# Patient Record
Sex: Male | Born: 1965
Health system: Southern US, Community
[De-identification: ages and names within clinical notes are randomized; demographics above are authoritative.]

## PROBLEM LIST (undated history)

## (undated) DIAGNOSIS — F250 Schizoaffective disorder, bipolar type: Secondary | ICD-10-CM

## (undated) DIAGNOSIS — F259 Schizoaffective disorder, unspecified: Secondary | ICD-10-CM

## (undated) DIAGNOSIS — K219 Gastro-esophageal reflux disease without esophagitis: Secondary | ICD-10-CM

## (undated) DIAGNOSIS — J449 Chronic obstructive pulmonary disease, unspecified: Secondary | ICD-10-CM

## (undated) DIAGNOSIS — F319 Bipolar disorder, unspecified: Secondary | ICD-10-CM

---

## 2011-03-31 ENCOUNTER — Other Ambulatory Visit (HOSPITAL_BASED_OUTPATIENT_CLINIC_OR_DEPARTMENT_OTHER): Payer: Self-pay | Admitting: Internal Medicine

## 2011-03-31 ENCOUNTER — Ambulatory Visit (HOSPITAL_BASED_OUTPATIENT_CLINIC_OR_DEPARTMENT_OTHER)
Admission: RE | Admit: 2011-03-31 | Discharge: 2011-03-31 | Disposition: A | Discharge status: Home / Self Care | Payer: Medicare Other | Source: Ambulatory Visit | Attending: Internal Medicine | Admitting: Internal Medicine

## 2011-03-31 DIAGNOSIS — M7989 Other specified soft tissue disorders: Secondary | ICD-10-CM

## 2011-03-31 DIAGNOSIS — R52 Pain, unspecified: Secondary | ICD-10-CM

## 2011-03-31 DIAGNOSIS — L988 Other specified disorders of the skin and subcutaneous tissue: Secondary | ICD-10-CM | POA: Insufficient documentation

## 2011-03-31 DIAGNOSIS — L539 Erythematous condition, unspecified: Secondary | ICD-10-CM

## 2011-03-31 DIAGNOSIS — M79609 Pain in unspecified limb: Secondary | ICD-10-CM

## 2011-06-01 ENCOUNTER — Emergency Department (HOSPITAL_COMMUNITY): Payer: Medicare Other

## 2011-06-01 ENCOUNTER — Inpatient Hospital Stay (HOSPITAL_COMMUNITY)
Admission: EM | Admit: 2011-06-01 | Discharge: 2011-06-03 | DRG: 603 | Disposition: A | Discharge status: Home / Self Care | Payer: Medicare Other | Attending: Internal Medicine | Admitting: Internal Medicine

## 2011-06-01 ENCOUNTER — Other Ambulatory Visit: Payer: Self-pay

## 2011-06-01 ENCOUNTER — Encounter (HOSPITAL_COMMUNITY): Payer: Self-pay | Admitting: Emergency Medicine

## 2011-06-01 DIAGNOSIS — L02419 Cutaneous abscess of limb, unspecified: Secondary | ICD-10-CM | POA: Diagnosis not present

## 2011-06-01 DIAGNOSIS — J449 Chronic obstructive pulmonary disease, unspecified: Secondary | ICD-10-CM | POA: Diagnosis present

## 2011-06-01 DIAGNOSIS — E871 Hypo-osmolality and hyponatremia: Secondary | ICD-10-CM | POA: Diagnosis present

## 2011-06-01 DIAGNOSIS — D696 Thrombocytopenia, unspecified: Secondary | ICD-10-CM | POA: Diagnosis not present

## 2011-06-01 DIAGNOSIS — R4182 Altered mental status, unspecified: Secondary | ICD-10-CM | POA: Diagnosis not present

## 2011-06-01 DIAGNOSIS — M7989 Other specified soft tissue disorders: Secondary | ICD-10-CM | POA: Diagnosis not present

## 2011-06-01 DIAGNOSIS — J4489 Other specified chronic obstructive pulmonary disease: Secondary | ICD-10-CM | POA: Diagnosis present

## 2011-06-01 DIAGNOSIS — I498 Other specified cardiac arrhythmias: Secondary | ICD-10-CM | POA: Diagnosis present

## 2011-06-01 DIAGNOSIS — E860 Dehydration: Secondary | ICD-10-CM | POA: Diagnosis not present

## 2011-06-01 DIAGNOSIS — F319 Bipolar disorder, unspecified: Secondary | ICD-10-CM | POA: Diagnosis present

## 2011-06-01 DIAGNOSIS — D649 Anemia, unspecified: Secondary | ICD-10-CM | POA: Diagnosis present

## 2011-06-01 DIAGNOSIS — J45909 Unspecified asthma, uncomplicated: Secondary | ICD-10-CM | POA: Diagnosis not present

## 2011-06-01 DIAGNOSIS — L03119 Cellulitis of unspecified part of limb: Secondary | ICD-10-CM | POA: Diagnosis not present

## 2011-06-01 HISTORY — DX: Bipolar disorder, unspecified: F31.9

## 2011-06-01 HISTORY — DX: Schizoaffective disorder, bipolar type: F25.0

## 2011-06-01 HISTORY — DX: Schizoaffective disorder, unspecified: F25.9

## 2011-06-01 HISTORY — DX: Chronic obstructive pulmonary disease, unspecified: J44.9

## 2011-06-01 LAB — CBC
HCT: 41.2 % (ref 39.0–52.0)
MCH: 28.2 pg (ref 26.0–34.0)
MCV: 80.8 fL (ref 78.0–100.0)
Platelets: 135 10*3/uL — ABNORMAL LOW (ref 150–400)
RBC: 5.1 MIL/uL (ref 4.22–5.81)
RDW: 12.6 % (ref 11.5–15.5)

## 2011-06-01 LAB — BASIC METABOLIC PANEL
BUN: 18 mg/dL (ref 6–23)
CO2: 22 mEq/L (ref 19–32)
Chloride: 101 mEq/L (ref 96–112)
Creatinine, Ser: 1.23 mg/dL (ref 0.50–1.35)

## 2011-06-01 LAB — HEPATIC FUNCTION PANEL
ALT: 20 U/L (ref 0–53)
Bilirubin, Direct: 0.1 mg/dL (ref 0.0–0.3)
Indirect Bilirubin: 0.6 mg/dL (ref 0.3–0.9)
Total Bilirubin: 0.7 mg/dL (ref 0.3–1.2)

## 2011-06-01 LAB — RAPID URINE DRUG SCREEN, HOSP PERFORMED
Amphetamines: NOT DETECTED
Benzodiazepines: NOT DETECTED
Opiates: NOT DETECTED

## 2011-06-01 LAB — AMMONIA: Ammonia: <10 umol/L — ABNORMAL LOW (ref 11–60)

## 2011-06-01 LAB — URINALYSIS, ROUTINE W REFLEX MICROSCOPIC
Glucose, UA: NEGATIVE mg/dL
Leukocytes, UA: NEGATIVE
Nitrite: NEGATIVE
pH: 6.5 (ref 5.0–8.0)

## 2011-06-01 MED ORDER — ONDANSETRON 4 MG PO TBDP
4.0000 mg | ORAL_TABLET | Freq: Three times a day (TID) | ORAL | Status: DC | PRN
  Filled 2011-06-01: qty 1

## 2011-06-01 MED ORDER — RISPERIDONE 2 MG PO TABS
4.0000 mg | ORAL_TABLET | Freq: Two times a day (BID) | ORAL | Status: DC
  Administered 2011-06-01 – 2011-06-03 (×4): 4 mg via ORAL
  Filled 2011-06-01 (×5): qty 2

## 2011-06-01 MED ORDER — ACETAMINOPHEN 325 MG PO TABS
650.0000 mg | ORAL_TABLET | Freq: Four times a day (QID) | ORAL | Status: DC | PRN
  Administered 2011-06-01: 650 mg via ORAL
  Filled 2011-06-01: qty 2

## 2011-06-01 MED ORDER — BENZTROPINE MESYLATE 1 MG PO TABS
1.0000 mg | ORAL_TABLET | Freq: Two times a day (BID) | ORAL | Status: DC
  Administered 2011-06-01 – 2011-06-03 (×4): 1 mg via ORAL
  Filled 2011-06-01 (×5): qty 1

## 2011-06-01 MED ORDER — OXYCODONE HCL 5 MG PO TABS
5.0000 mg | ORAL_TABLET | ORAL | Status: DC | PRN
  Administered 2011-06-02: 5 mg via ORAL
  Filled 2011-06-01: qty 1

## 2011-06-01 MED ORDER — ENOXAPARIN SODIUM 40 MG/0.4ML ~~LOC~~ SOLN
40.0000 mg | SUBCUTANEOUS | Status: DC
  Administered 2011-06-01 – 2011-06-02 (×2): 40 mg via SUBCUTANEOUS
  Filled 2011-06-01 (×3): qty 0.4

## 2011-06-01 MED ORDER — SODIUM CHLORIDE 0.9 % IV SOLN
INTRAVENOUS | Status: DC
  Administered 2011-06-01: 1000 mL via INTRAVENOUS
  Administered 2011-06-02: 10:00:00 via INTRAVENOUS

## 2011-06-01 MED ORDER — VANCOMYCIN HCL IN DEXTROSE 1-5 GM/200ML-% IV SOLN
1000.0000 mg | Freq: Two times a day (BID) | INTRAVENOUS | Status: DC
  Administered 2011-06-02 – 2011-06-03 (×3): 1000 mg via INTRAVENOUS
  Filled 2011-06-01 (×4): qty 200

## 2011-06-01 MED ORDER — TRAZODONE HCL 50 MG PO TABS
50.0000 mg | ORAL_TABLET | Freq: Every day | ORAL | Status: DC
  Administered 2011-06-01 – 2011-06-02 (×2): 50 mg via ORAL
  Filled 2011-06-01 (×3): qty 1

## 2011-06-01 MED ORDER — ASPIRIN EC 81 MG PO TBEC
81.0000 mg | DELAYED_RELEASE_TABLET | Freq: Every day | ORAL | Status: DC
  Administered 2011-06-02 – 2011-06-03 (×2): 81 mg via ORAL
  Filled 2011-06-01 (×2): qty 1

## 2011-06-01 MED ORDER — VANCOMYCIN HCL IN DEXTROSE 1-5 GM/200ML-% IV SOLN
1000.0000 mg | Freq: Once | INTRAVENOUS | Status: AC
  Administered 2011-06-01: 1000 mg via INTRAVENOUS
  Filled 2011-06-01: qty 200

## 2011-06-01 MED ORDER — MELOXICAM 7.5 MG PO TABS
7.5000 mg | ORAL_TABLET | Freq: Every day | ORAL | Status: DC
  Administered 2011-06-02 – 2011-06-03 (×2): 7.5 mg via ORAL
  Filled 2011-06-01 (×2): qty 1

## 2011-06-01 MED ORDER — CLONAZEPAM 0.5 MG PO TABS
0.5000 mg | ORAL_TABLET | Freq: Two times a day (BID) | ORAL | Status: DC | PRN
  Administered 2011-06-02: 0.5 mg via ORAL
  Filled 2011-06-01: qty 1

## 2011-06-01 MED ORDER — ALBUTEROL SULFATE HFA 108 (90 BASE) MCG/ACT IN AERS
2.0000 | INHALATION_SPRAY | RESPIRATORY_TRACT | Status: DC | PRN
  Filled 2011-06-01: qty 6.7

## 2011-06-01 MED ORDER — ACETAMINOPHEN 650 MG RE SUPP: 650.0000 mg | Freq: Four times a day (QID) | RECTAL | Status: DC | PRN

## 2011-06-01 MED ORDER — PANTOPRAZOLE SODIUM 40 MG PO TBEC
80.0000 mg | DELAYED_RELEASE_TABLET | Freq: Every day | ORAL | Status: DC
  Administered 2011-06-02 – 2011-06-03 (×2): 80 mg via ORAL
  Filled 2011-06-01 (×2): qty 2

## 2011-06-01 MED ORDER — OLANZAPINE 5 MG PO TABS
5.0000 mg | ORAL_TABLET | Freq: Two times a day (BID) | ORAL | Status: DC
  Administered 2011-06-01 – 2011-06-03 (×4): 5 mg via ORAL
  Filled 2011-06-01 (×5): qty 1

## 2011-06-01 MED ORDER — HYDROXYZINE HCL 25 MG PO TABS
25.0000 mg | ORAL_TABLET | Freq: Three times a day (TID) | ORAL | Status: DC
  Administered 2011-06-01 – 2011-06-03 (×5): 25 mg via ORAL
  Filled 2011-06-01 (×7): qty 1

## 2011-06-01 MED ORDER — ALBUTEROL SULFATE (5 MG/ML) 0.5% IN NEBU: 2.5000 mg | INHALATION_SOLUTION | RESPIRATORY_TRACT | Status: DC | PRN

## 2011-06-01 NOTE — Progress Notes (Signed)
Right lower extremity venous duplex completed.  Preliminary report is negative for DVT, SVT, or a Baker's cyst in the right leg.  Negative for DVT in the left common femoral vein. 

## 2011-06-01 NOTE — ED Provider Notes (Signed)
History     CSN: [id number]  Arrival date & time 06/01/11  1155   First MD Initiated Contact with Patient 06/01/11 1328      Chief Complaint  Patient presents with  . Weakness   HPI Patient is confused and cannot provide history.  Per a caregiver at group home, he has been acting confused and weak since last night, and there is concern he and his room mate may have done some illegal drugs. Patient denies this.    The care giver reports he has otherwise been well lately.    Past Medical History  Diagnosis Date  . Bipolar 1 disorder   . Schizo affective schizophrenia   . Asthma   . COPD (chronic obstructive pulmonary disease)     No past surgical history on file.  No family history on file.  History  Substance Use Topics  . Smoking status: Former Games developer  . Smokeless tobacco: Not on file  . Alcohol Use: No      Review of Systems Level 5 Caveat due to AMS  Allergies  Review of patient's allergies indicates no known allergies.  Home Medications   Current Outpatient Rx  Name Route Sig Dispense Refill  . ALBUTEROL SULFATE HFA 108 (90 BASE) MCG/ACT IN AERS Inhalation Inhale 2 puffs into the lungs every 4 (four) hours as needed. As needed for shortness of breath.    . ASPIRIN EC 81 MG PO TBEC Oral Take 81 mg by mouth daily.    Marland Kitchen BENZTROPINE MESYLATE 1 MG PO TABS Oral Take 1 mg by mouth 2 (two) times daily.    Marland Kitchen CLONAZEPAM 0.5 MG PO TABS Oral Take 0.5 mg by mouth 2 (two) times daily as needed. As needed for anxiety.    . ESOMEPRAZOLE MAGNESIUM 40 MG PO CPDR Oral Take 40 mg by mouth daily before breakfast.    . HYDROXYZINE HCL 25 MG PO TABS Oral Take 25 mg by mouth 3 (three) times daily.    . MELOXICAM 7.5 MG PO TABS Oral Take 7.5 mg by mouth daily.    Marland Kitchen OLANZAPINE 10 MG PO TABS Oral Take 5 mg by mouth 2 (two) times daily.    Marland Kitchen ONDANSETRON 4 MG PO TBDP Oral Take 4 mg by mouth every 8 (eight) hours as needed. As needed for nausea.    Marland Kitchen PALIPERIDONE PALMITATE 156 MG/ML IM  SUSP Intramuscular Inject 156 mg into the muscle every 30 (thirty) days.    Marland Kitchen RISPERIDONE 4 MG PO TABS Oral Take 4 mg by mouth 2 (two) times daily.    . TRAZODONE HCL 50 MG PO TABS Oral Take 50 mg by mouth at bedtime.      BP 105/80  Pulse 103  Temp(Src) 97.9 F (36.6 C) (Oral)  Resp 20  SpO2 100%  Physical Exam  Constitutional: He appears lethargic. No distress.  HENT:  Head: Atraumatic.       Nose has blue color.   Eyes: EOM are normal. Pupils are equal, round, and reactive to light.  Neck: Normal range of motion. Neck supple.  Cardiovascular: Normal rate, regular rhythm and normal heart sounds.   Pulmonary/Chest: Effort normal and breath sounds normal. He has no rales.  Abdominal: Soft. Bowel sounds are normal. There is no tenderness.  Musculoskeletal:       Right lower extremity is erythematous from ankle to groin, running along inner thigh.  He has some swelling in that leg.  No edema in LLE.   Neurological: He  appears lethargic.       Patient is oriented only to himself, knows he is in hospital, otherwise disoriented.  Does not know what happened to get him here, does not remember. Speech slightly slurred. He does follow commands.     ED Course  Procedures (including critical care time)  Labs Reviewed  CBC - Abnormal; Notable for the following:    WBC 11.4 (*)    Platelets 135 (*)    All other components within normal limits  PROTIME-INR - Abnormal; Notable for the following:    Prothrombin Time 16.5 (*)    All other components within normal limits  AMMONIA - Abnormal; Notable for the following:    Ammonia <10 (*)    All other components within normal limits  BASIC METABOLIC PANEL - Abnormal; Notable for the following:    Sodium 134 (*)    GFR calc non Af Amer 69 (*)    GFR calc Af Amer 80 (*)    All other components within normal limits  URINALYSIS, ROUTINE W REFLEX MICROSCOPIC  URINE RAPID DRUG SCREEN (HOSP PERFORMED)  HEPATIC FUNCTION PANEL  LACTIC ACID,  PLASMA  CULTURE, BLOOD (ROUTINE X 2)  CULTURE, BLOOD (ROUTINE X 2)  URINE CULTURE   Dg Chest 2 View  06/01/2011  *RADIOLOGY REPORT*  Clinical Data: Altered mental status, COPD, asthma.  CHEST - 2 VIEW  Comparison: None  Findings: Heart and mediastinal contours are within normal limits. No focal opacities or effusions.  No acute bony abnormality.  IMPRESSION: No active cardiopulmonary disease.  Original Report Authenticated By: Cyndie Chime, M.D.     1. Cellulitis of leg       MDM  [facility].  Will order repeat LE doppler to rule out DVT.         Ardyth Gal, MD 06/01/11 [phone]

## 2011-06-01 NOTE — Progress Notes (Signed)
Pt admitted to the floor and made comfortable. Pt resting comfortably in bed. Discussed care plan with sister. She is at bedside. Discussed pt orientation status and ability to use call bell. Bed alarm placed and in active working order. Pt is currently alert and oriented to person place and situation but not time. Will report to oncoming RN to continue to check on pt and that pt needs many prompts to gain answers regarding pt needs and or wants. Sister and pt both pleased. Laure Kidney Culberson

## 2011-06-01 NOTE — ED Provider Notes (Signed)
I saw and evaluated the patient, reviewed the resident's note and I agree with the findings and plan.  Patient seen and examined. Patient's right LE ext.  appears to have cellulitis with extension up to his right thigh. Patient to be started on vancomycin and will be admitted by the medicine service  Toy Baker, MD 06/01/11 1419

## 2011-06-01 NOTE — ED Notes (Signed)
Weak since last night no n/v states goes to a day program and  He has not been acting right  Rt foot is swollen

## 2011-06-01 NOTE — ED Notes (Signed)
Attempted to call report to [phone].  Will return my call

## 2011-06-01 NOTE — ED Notes (Signed)
Rt leg shows pitting edema.  Leg is hot to touch and painful to touch,  Pt unable to tell RN how long leg has been like this.  Pt seems lathargic but wakes to verbal commands.

## 2011-06-01 NOTE — ED Provider Notes (Signed)
I saw and evaluated the patient, reviewed the resident's note and I agree with the findings and plan.  Toy Baker, MD 06/01/11 2033

## 2011-06-01 NOTE — H&P (Signed)
PCP:   No primary provider on file.   Chief Complaint:  Right leg pain and altered mental status  HPI: 46 year old Caucasian male patient, group home resident, with history of bipolar disorder, asthma who is a limited historian secondary to his psychiatric illness and possibly new onset altered mental status. He is oriented to self and to place. He knows that this is the hospital and indicates that he's here because he is sick his right leg hurts. He is unable to elaborate any further history. He denies any trauma to the right leg. He denies any suicidal or homicidal ideations. There is no other historian with him at this time. Per the ED physician's discussion with the caregiver from the group home, patient has been acting confused and weak since last night and there was concern that the patient and his roommate may have done some illegal drugs. Patient however denies doing any drugs at this time. The [facility] hospitalist have been requested to admit for further evaluation and management.  Past Medical History: Past Medical History  Diagnosis Date  . Bipolar 1 disorder   . Schizo affective schizophrenia   . Asthma   . COPD (chronic obstructive pulmonary disease)     Past Surgical History: History reviewed. No pertinent past surgical history.  Allergies:  No Known Allergies  Medications: Prior to Admission medications   Medication Sig Start Date End Date Taking? Authorizing Provider  albuterol (PROVENTIL HFA;VENTOLIN HFA) 108 (90 BASE) MCG/ACT inhaler Inhale 2 puffs into the lungs every 4 (four) hours as needed. As needed for shortness of breath.   Yes Historical Provider, MD  aspirin EC 81 MG tablet Take 81 mg by mouth daily.   Yes Historical Provider, MD  benztropine (COGENTIN) 1 MG tablet Take 1 mg by mouth 2 (two) times daily.   Yes Historical Provider, MD  clonazePAM (KLONOPIN) 0.5 MG tablet Take 0.5 mg by mouth 2 (two) times daily as needed. As needed for anxiety.   Yes Historical  Provider, MD  esomeprazole (NEXIUM) 40 MG capsule Take 40 mg by mouth daily before breakfast.   Yes Historical Provider, MD  hydrOXYzine (ATARAX/VISTARIL) 25 MG tablet Take 25 mg by mouth 3 (three) times daily.   Yes Historical Provider, MD  meloxicam (MOBIC) 7.5 MG tablet Take 7.5 mg by mouth daily.   Yes Historical Provider, MD  OLANZapine (ZYPREXA) 10 MG tablet Take 5 mg by mouth 2 (two) times daily.   Yes Historical Provider, MD  ondansetron (ZOFRAN-ODT) 4 MG disintegrating tablet Take 4 mg by mouth every 8 (eight) hours as needed. As needed for nausea.   Yes Historical Provider, MD  Paliperidone Palmitate (INVEGA SUSTENNA) 156 MG/ML SUSP Inject 156 mg into the muscle every 30 (thirty) days.   Yes Historical Provider, MD  risperidone (RISPERDAL) 4 MG tablet Take 4 mg by mouth 2 (two) times daily.   Yes Historical Provider, MD  traZODone (DESYREL) 50 MG tablet Take 50 mg by mouth at bedtime.   Yes Historical Provider, MD    Family History: History reviewed. No pertinent family history. patient is unable to provide any details.  Social History:  reports that he has quit smoking. He does not have any smokeless tobacco history on file. He reports that he does not drink alcohol. His drug history not on file. he cannot tell me when he quit smoking. He denies any drug abuse.  Review of Systems:  Difficulty in performing full review of systems secondary to patient's altered mental status. Apart  from pain in the right leg patient denies any complaints.  Physical Exam: Filed Vitals:   06/01/11 1319 06/01/11 1417 06/01/11 1504 06/01/11 1545  BP: 105/80  114/65 111/69  Pulse: 103  112 107  Temp: 97.9 F (36.6 C) 98.2 F (36.8 C)    TempSrc: Oral Rectal    Resp: 20   15  SpO2: 100%  100% 100%   General exam: Moderately built and nourished male patient who is lying comfortably supine on the gurney and is in no obvious distress. Head, eyes and ENT: Nontraumatic and normocephalic. Pupils equally  reacting to light and accommodation. Oral mucosa is mildly dry but no other acute findings. Lymphatics: No lymphadenopathy. Neck: Supple. No JVD or carotid bruit. Respiratory system: Clear. No increased work of breathing. Cardiovascular system: First and second heart sounds heard, regular and mildly tachycardic. No murmurs or JVD or pedal edema. Gastrointestinal system: Abdomen is nondistended, soft and normal bowel sounds heard. No organomegaly or masses appreciated. Central nervous system: Alert and oriented to self and partly to place but not to time. No focal neurological deficits. Extremities: Right leg is swollen from below the knee to the ankle, warm and patchy redness and mildly tender. There is no fluctuance or crepitus. There is a streak of redness on the medial aspect of the right thigh going to just below the groin. No open wounds. There is a hard approximately 2 cm diameter swelling on the dorsum of his left wrist which is nontender with no acute findings and is probably chronic. Psychiatry: Flat affect but cooperative.   Labs on Admission:   [facility] 06/01/11 1327  NA 134*  K 4.1  CL 101  CO2 22  GLUCOSE 97  BUN 18  CREATININE 1.23  CALCIUM 9.1  MG --  PHOS --    Basename 06/01/11 1327  AST 24  ALT 20  ALKPHOS 75  BILITOT 0.7  PROT 7.0  ALBUMIN 3.8   No results found for this basename: LIPASE:2,AMYLASE:2 in the last 72 hours  Basename 06/01/11 1327  WBC 11.4*  NEUTROABS --  HGB 14.4  HCT 41.2  MCV 80.8  PLT 135*   No results found for this basename: CKTOTAL:3,CKMB:3,CKMBINDEX:3,TROPONINI:3 in the last 72 hours No results found for this basename: TSH,T4TOTAL,FREET3,T3FREE,THYROIDAB in the last 72 hours No results found for this basename: VITAMINB12:2,FOLATE:2,FERRITIN:2,TIBC:2,IRON:2,RETICCTPCT:2 in the last 72 hours  Radiological Exams on Admission: Dg Chest 2 View  06/01/2011  *RADIOLOGY REPORT*  Clinical Data: Altered mental status, COPD, asthma.   CHEST - 2 VIEW  Comparison: None  Findings: Heart and mediastinal contours are within normal limits. No focal opacities or effusions.  No acute bony abnormality.  IMPRESSION: No active cardiopulmonary disease.  Original Report Authenticated By: Cyndie Chime, M.D.   Right lower extremity venous Dopplers: Preliminary report is negative for DVT, SVT, or a Baker's cyst in the right leg. Negative for DVT in the left common femoral vein.  EKG: Sinus tachycardia to 104 beats per minute normal axis, no acute ischemic changes and QTC of 452 ms.  Assessment/Plan Present on Admission:  .Cellulitis and abscess of leg .Altered mental status .Thrombocytopenia  1. Right leg cellulitis with associated medial thigh thrombophlebitis: Admit to medical floor. Patient has received intravenous vancomycin in the emergency department which will be continued to ensure that he is covered for MRSA. Elevate right lower extremity and pain management. Ruled out for DVT by venous Doppler. 2. Mild dehydration: Brief IV fluids. 3. Altered mental status: Unclear what  the patient's baseline mental status is. However currently is oriented x2 and is cooperative in his care. No focal deficits. It is possible that his cellulitis and associated pain may be contributing some to his altered mental status. Monitor. 4. Thrombocytopenia: No prior results to compare. Will follow CBCs tomorrow. 5. Mild hyponatremia: Possibly secondary to dehydration. Monitor BMP tomorrow. 6. Bipolar disorder: Continue home medications except the monthly Depot preparation. 7. Asthma history: Stable.   Morgan Keinath 06/01/2011, 5:42 PM

## 2011-06-01 NOTE — Progress Notes (Signed)
ANTIBIOTIC CONSULT NOTE - INITIAL  Pharmacy Consult for Vancomycin Indication: cellulitis  No Known Allergies  Patient Measurements: Weight: 184 lb 3.2 oz (83.553 kg)  Vital Signs: Temp: 98.2 F (36.8 C) (02/13 1417) Temp src: Rectal (02/13 1417) BP: 111/69 mmHg (02/13 1545) Pulse Rate: 107  (02/13 1545) Intake/Output from previous day:   Intake/Output from this shift:    Labs:  Basename 06/01/11 1327  WBC 11.4*  HGB 14.4  PLT 135*  LABCREA --  CREATININE 1.23   CrCl is unknown because there is no height on file for the current visit. No results found for this basename: VANCOTROUGH:2,VANCOPEAK:2,VANCORANDOM:2,GENTTROUGH:2,GENTPEAK:2,GENTRANDOM:2,TOBRATROUGH:2,TOBRAPEAK:2,TOBRARND:2,AMIKACINPEAK:2,AMIKACINTROU:2,AMIKACIN:2, in the last 72 hours   Microbiology: No results found for this or any previous visit (from the past 720 hour(s)).  Medical History: Past Medical History  Diagnosis Date  . Bipolar 1 disorder   . Schizo affective schizophrenia   . Asthma   . COPD (chronic obstructive pulmonary disease)     Medications:  Scheduled:    . aspirin EC  81 mg Oral Daily  . benztropine  1 mg Oral BID  . enoxaparin  40 mg Subcutaneous Q24H  . hydrOXYzine  25 mg Oral TID  . meloxicam  7.5 mg Oral Daily  . OLANZapine  5 mg Oral BID  . pantoprazole  80 mg Oral Q1200  . risperidone  4 mg Oral BID  . traZODone  50 mg Oral QHS  . vancomycin  1,000 mg Intravenous Once   Assessment: 45 YOM group home resident presented with altered mental status and right leg pain to start vancomycin for cellulitis with MRSA coverage. Patient is afebrile, wbc slightly elevated = 11.4, blood and urine cultures are pending. Scr 1.23, est. crcl 69. Vancomycin 1g IV was given in the ED at 1500.  Goal of Therapy:  Vancomycin trough level 10-15 mcg/ml  Plan:  1. Vancomycin 1g IV Q12 hrs, next dose 0300 2. F/u renal function and cultures 3. Check vancomycin trough after 2-3 doses if  indicated.  Riki Rusk 06/01/2011,8:59 PM

## 2011-06-01 NOTE — ED Notes (Signed)
[id number] Ready

## 2011-06-01 NOTE — ED Notes (Signed)
Called report to tabitha

## 2011-06-01 NOTE — ED Notes (Signed)
Family at bedside. 

## 2011-06-02 DIAGNOSIS — R4182 Altered mental status, unspecified: Secondary | ICD-10-CM | POA: Diagnosis not present

## 2011-06-02 DIAGNOSIS — L03119 Cellulitis of unspecified part of limb: Secondary | ICD-10-CM | POA: Diagnosis not present

## 2011-06-02 DIAGNOSIS — E860 Dehydration: Secondary | ICD-10-CM | POA: Diagnosis not present

## 2011-06-02 DIAGNOSIS — D649 Anemia, unspecified: Secondary | ICD-10-CM | POA: Diagnosis present

## 2011-06-02 LAB — BASIC METABOLIC PANEL
BUN: 16 mg/dL (ref 6–23)
CO2: 23 mEq/L (ref 19–32)
Chloride: 104 mEq/L (ref 96–112)
Creatinine, Ser: 1.04 mg/dL (ref 0.50–1.35)
GFR calc Af Amer: >90 mL/min (ref >90)
Potassium: 3.7 mEq/L (ref 3.5–5.1)

## 2011-06-02 LAB — URINE CULTURE
Colony Count: NO GROWTH
Culture  Setup Time: 201302131437
Culture: NO GROWTH

## 2011-06-02 LAB — CBC
HCT: 34.2 % — ABNORMAL LOW (ref 39.0–52.0)
MCV: 80.9 fL (ref 78.0–100.0)
RBC: 4.23 MIL/uL (ref 4.22–5.81)
RDW: 12.9 % (ref 11.5–15.5)
WBC: 7.7 10*3/uL (ref 4.0–10.5)

## 2011-06-02 MED ORDER — POLYETHYLENE GLYCOL 3350 17 G PO PACK
17.0000 g | PACK | Freq: Every day | ORAL | Status: DC
  Administered 2011-06-02 – 2011-06-03 (×2): 17 g via ORAL
  Filled 2011-06-02 (×2): qty 1

## 2011-06-02 MED ORDER — WHITE PETROLATUM GEL
Status: AC
  Filled 2011-06-02: qty 5

## 2011-06-02 NOTE — Progress Notes (Signed)
Utilization review completed.  

## 2011-06-02 NOTE — Progress Notes (Addendum)
Subjective: Leg pain and redness. No NVD. No fevers or chills. Objective: Vital signs in last 24 hours: Temp:  [97 F (36.1 C)-100.8 F (38.2 C)] 97.7 F (36.5 C) (02/14 1400) Pulse Rate:  [98-109] 101  (02/14 1400) Resp:  [18-20] 18  (02/14 1400) BP: (103-109)/(66-72) 103/66 mmHg (02/14 1400) SpO2:  [93 %-99 %] 99 % (02/14 1400) Weight:  [83.553 kg (184 lb 3.2 oz)] 83.553 kg (184 lb 3.2 oz) (02/13 1900) Weight change:  Last BM Date: 05/31/11  Intake/Output from previous day: 02/13 0701 - 02/14 0700 In: 800 [P.O.:800] Out: 1000 [Urine:1000] Total I/O In: 480 [P.O.:480] Out: 1900 [Urine:1900]   Physical Exam: General: Alert, awake, oriented x3, in no acute distress. HEENT: No bruits, no goiter. Heart: Regular rate and rhythm, without murmurs, rubs, gallops. Lungs: Clear to auscultation bilaterally. Abdomen: Soft, nontender, nondistended, positive bowel sounds. Extremities: Right leg has area of circumferential redness and induration of lower calf. Left leg normal. Neuro: Grossly intact, nonfocal.  Lab Results: Basic Metabolic Panel:  Basename 06/02/11 0600 06/01/11 1327  NA 136 134*  K 3.7 4.1  CL 104 101  CO2 23 22  GLUCOSE 104* 97  BUN 16 18  CREATININE 1.04 1.23  CALCIUM 8.5 9.1  MG -- --  PHOS -- --   Liver Function Tests:  Basename 06/01/11 1327  AST 24  ALT 20  ALKPHOS 75  BILITOT 0.7  PROT 7.0  ALBUMIN 3.8   No results found for this basename: LIPASE:2,AMYLASE:2 in the last 72 hours  Basename 06/01/11 1429  AMMONIA <10*   CBC:  Basename 06/02/11 0600 06/01/11 1327  WBC 7.7 11.4*  NEUTROABS -- --  HGB 11.8* 14.4  HCT 34.2* 41.2  MCV 80.9 80.8  PLT 124* 135*    Coagulation:  Basename 06/01/11 1327  LABPROT 16.5*  INR 1.31   Urine Drug Screen: Drugs of Abuse     Component Value Date/Time   LABOPIA NONE DETECTED 06/01/2011 1405   COCAINSCRNUR NONE DETECTED 06/01/2011 1405   LABBENZ NONE DETECTED 06/01/2011 1405   AMPHETMU NONE  DETECTED 06/01/2011 1405   THCU NONE DETECTED 06/01/2011 1405   LABBARB NONE DETECTED 06/01/2011 1405    Alcohol Level: No results found for this basename: ETH:2 in the last 72 hours Urinalysis:  Basename 06/01/11 1405  COLORURINE YELLOW  LABSPEC 1.022  PHURINE 6.5  GLUCOSEU NEGATIVE  HGBUR NEGATIVE  BILIRUBINUR NEGATIVE  KETONESUR NEGATIVE  PROTEINUR NEGATIVE  UROBILINOGEN 1.0  NITRITE NEGATIVE  LEUKOCYTESUR NEGATIVE   Misc. Labs: No results found for this or any previous visit (from the past 240 hour(s)).  Studies/Results: Dg Chest 2 View  06/01/2011  *RADIOLOGY REPORT*  Clinical Data: Altered mental status, COPD, asthma.  CHEST - 2 VIEW  Comparison: None  Findings: Heart and mediastinal contours are within normal limits. No focal opacities or effusions.  No acute bony abnormality.  IMPRESSION: No active cardiopulmonary disease.  Original Report Authenticated By: Cyndie Chime, M.D.    Medications: Scheduled Meds:   . aspirin EC  81 mg Oral Daily  . benztropine  1 mg Oral BID  . enoxaparin  40 mg Subcutaneous Q24H  . hydrOXYzine  25 mg Oral TID  . meloxicam  7.5 mg Oral Daily  . OLANZapine  5 mg Oral BID  . pantoprazole  80 mg Oral Q1200  . risperidone  4 mg Oral BID  . traZODone  50 mg Oral QHS  . vancomycin  1,000 mg Intravenous Once  . vancomycin  1,000 mg Intravenous Q12H   Continuous Infusions:   . sodium chloride 75 mL/hr at 06/02/11 1021   PRN Meds:.acetaminophen, acetaminophen, albuterol, albuterol, clonazePAM, ondansetron, oxyCODONE  Assessment/Plan:  Principal Problem:  *Cellulitis and abscess of leg  Active Problems:  Altered mental status  Thrombocytopenia  Anemia  1. Cellulitis Right Lower leg: We'll continue another 24 hours of vancomycin and Zosyn I have marked the borders of the redness and induration- if this appears to be improving tomorrow we can transition to oral medications and consider discharge home. There is no area of a fluctuant  mass or abscess this is mostly in the lower extremity superficial tissues the patient does have pain on palpation was able to bear weight on this leg Dopplers were negative for any localized or other abnormality. He has an enlarged inguinal lymph node which is consistent with a reactive process.  2. Anemia: Mild anemia at 11.4. Continue to monitor with a morning CBC.  3. Altered Mental Status: The patient sisters in his room today and he appears to be at his baseline mental status he has severe bipolar disorder as well as cognitive delay of lives in a group home where he receives assistance with his daily living activities.  4. 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 He will likely discharge back to group home tomorrow with oral antibiotics and close outpatient follow-up.    LOS: 1 day   [facility] Pager: [phone] 06/02/2011, 3:46 PM

## 2011-06-02 NOTE — Progress Notes (Signed)
   CARE MANAGEMENT NOTE 06/02/2011  Patient:  Lee [facility], Lee House   Account Number:  [id number]  Date Initiated:  06/02/2011  Documentation initiated by:  Letha Cape  Subjective/Objective Assessment:   dx cellulitis, abscess of leg  admit- from Group Home resident.     Action/Plan:   Anticipated DC Date:  06/03/2011   Anticipated DC Plan:  HOME/SELF CARE      DC Planning Services  CM consult      Choice offered to / List presented to:             Status of service:  In process, will continue to follow Medicare Important Message given?   (If response is "NO", the following Medicare IM given date fields will be blank) Date Medicare IM given:   Date Additional Medicare IM given:    Discharge Disposition:    Per UR Regulation:    Comments:  06/02/11 15:57 Letha Cape RN, BSN [phone] Pt will be transitioned to oral po abx on 2/15 and for possible dishcarge on 2/15.  NCM will continue to follow for dc needs.

## 2011-06-03 DIAGNOSIS — R4182 Altered mental status, unspecified: Secondary | ICD-10-CM | POA: Diagnosis not present

## 2011-06-03 DIAGNOSIS — L03119 Cellulitis of unspecified part of limb: Secondary | ICD-10-CM | POA: Diagnosis not present

## 2011-06-03 MED ORDER — POLYETHYLENE GLYCOL 3350 17 G PO PACK: 17.0000 g | PACK | Freq: Every day | ORAL | Status: AC | PRN

## 2011-06-03 MED ORDER — DOXYCYCLINE HYCLATE 100 MG PO CAPS: 100.0000 mg | ORAL_CAPSULE | Freq: Two times a day (BID) | ORAL | Status: AC

## 2011-06-03 MED ORDER — CLINDAMYCIN HCL 300 MG PO CAPS: 300.0000 mg | ORAL_CAPSULE | Freq: Three times a day (TID) | ORAL | Status: AC

## 2011-06-03 MED ORDER — OXYCODONE HCL 5 MG PO TABS: 5.0000 mg | ORAL_TABLET | ORAL | Status: AC | PRN

## 2011-06-03 NOTE — Discharge Summary (Addendum)
Physician Discharge Summary  Patient ID: Lee House MRN: [id number] DOB/AGE: March 27, 1966 46 y.o.  Admit date: 06/01/2011 Discharge date: 06/03/2011  Primary Care Physician:  Jackie Plum, MD  Discharge Diagnoses:    Principal Problem:  *Cellulitis and abscess of leg Active Problems:  Altered mental status  Thrombocytopenia  Anemia    Medication List  As of 06/03/2011 11:02 AM   STOP taking these medications         ondansetron 4 MG disintegrating tablet         TAKE these medications         albuterol 108 (90 BASE) MCG/ACT inhaler   Commonly known as: PROVENTIL HFA;VENTOLIN HFA   Inhale 2 puffs into the lungs every 4 (four) hours as needed. As needed for shortness of breath.      aspirin EC 81 MG tablet   Take 81 mg by mouth daily.      benztropine 1 MG tablet   Commonly known as: COGENTIN   Take 1 mg by mouth 2 (two) times daily.      clindamycin 300 MG capsule   Commonly known as: CLEOCIN   Take 1 capsule (300 mg total) by mouth 3 (three) times daily.      clonazePAM 0.5 MG tablet   Commonly known as: KLONOPIN   Take 0.5 mg by mouth 2 (two) times daily as needed. As needed for anxiety.      doxycycline 100 MG capsule   Commonly known as: VIBRAMYCIN   Take 1 capsule (100 mg total) by mouth 2 (two) times daily.      esomeprazole 40 MG capsule   Commonly known as: NEXIUM   Take 40 mg by mouth daily before breakfast.      hydrOXYzine 25 MG tablet   Commonly known as: ATARAX/VISTARIL   Take 25 mg by mouth 3 (three) times daily.      INVEGA SUSTENNA 156 MG/ML Susp   Generic drug: Paliperidone Palmitate   Inject 156 mg into the muscle every 30 (thirty) days.      meloxicam 7.5 MG tablet   Commonly known as: MOBIC   Take 7.5 mg by mouth daily.      OLANZapine 10 MG tablet   Commonly known as: ZYPREXA   Take 5 mg by mouth 2 (two) times daily.      oxyCODONE 5 MG immediate release tablet   Commonly known as: Oxy IR/ROXICODONE   Take 1  tablet (5 mg total) by mouth every 4 (four) hours as needed for pain.      polyethylene glycol packet   Commonly known as: MIRALAX / GLYCOLAX   Take 17 g by mouth daily as needed (constipation).      risperidone 4 MG tablet   Commonly known as: RISPERDAL   Take 4 mg by mouth 2 (two) times daily.      traZODone 50 MG tablet   Commonly known as: DESYREL   Take 50 mg by mouth at bedtime.             Disposition and Follow-up:  The patient is being discharged in good condition back to his group home at [facility] in [place]. His sister at his bedside we have discussed discharge plans in detail. He has been started on a regimen of oral antibiotics for he will take doxycycline for 10 days and clindamycin for 5 days for treatment of his cellulitis. He also needs to have an appointment with podiatry for good foot  care. I have recommended he elevate his leg at night and said his feet twice weekly. Additionally I recommended continued to do cardiovascular exercise and activity to 3 times per week for 20-30 minutes. I have given him instructions for when to followup with primary care physician as well as when to return if he does not continue to improve on his oral antibiotics. He can resume his normal activities on Monday following this discharge.   Significant Diagnostic Studies:  Dg Chest 2 View  06/01/2011  *RADIOLOGY REPORT*  Clinical Data: Altered mental status, COPD, asthma.  CHEST - 2 VIEW  Comparison: None  Findings: Heart and mediastinal contours are within normal limits. No focal opacities or effusions.  No acute bony abnormality.  IMPRESSION: No active cardiopulmonary disease.  Original Report Authenticated By: Cyndie Chime, M.D.    Brief H and P: The patient is a 46 year old man who presented to the emergency department on 06/01/2011 with complaints of right lower extremity redness pain swelling and edema. This is been going on for 2-3 days gradually worsening with  redness. He was having pain when bearing weight. He developed a fever. He is a resident of a group home, and he was sent to the emergency department for evaluation.  Hospital Course:  1. Lower extremity cellulitis: The patient has lower right leg cellulitis with a significant amount of redness swelling and induration on admission. He was started on IV vancomycin. The borders of the leg erythema were marked, and after administration of 2 days of IV vancomycin he had significant resolution of redness swelling pain and signs of cellulitis. There is no obvious source for his infection as his skin is intact. He does have onychomycosis of his nails which I referred him to podiatry and encourage good foot care. Upon discharge are started him on an oral regimen of doxycycline and clindamycin. If his symptoms do not improve he has been instructed to return to the emergency department to his PCP for evaluation.  2. Psychiatric Issues: During this hospitalization the patient was continued on his home psychiatric medications, he has had baseline behavior and his mental status issues have resolved. He is alert, talkative, and appropriate.   Time spent on Discharge: *50 minutes  Signed: [facility] Pager: [phone] 06/03/2011, 11:02 AM

## 2011-06-03 NOTE — Progress Notes (Signed)
  Pt to D/C today to [facility].  CSW spoke with [name] sister, and with staff at facility.  Both are in agreement with plan.  Sister will transport Pt.  Sister was provided with D/C packet.  CSW will sign off at this time.

## 2011-06-08 LAB — CULTURE, BLOOD (ROUTINE X 2)
Culture  Setup Time: 201302140104
Culture  Setup Time: 201302140104
Culture: NO GROWTH

## 2011-06-14 DIAGNOSIS — F259 Schizoaffective disorder, unspecified: Secondary | ICD-10-CM | POA: Diagnosis not present

## 2011-06-14 DIAGNOSIS — F411 Generalized anxiety disorder: Secondary | ICD-10-CM | POA: Diagnosis not present

## 2011-06-14 DIAGNOSIS — J449 Chronic obstructive pulmonary disease, unspecified: Secondary | ICD-10-CM | POA: Diagnosis not present

## 2011-06-14 DIAGNOSIS — G479 Sleep disorder, unspecified: Secondary | ICD-10-CM | POA: Diagnosis not present

## 2011-06-14 DIAGNOSIS — K3189 Other diseases of stomach and duodenum: Secondary | ICD-10-CM | POA: Diagnosis not present

## 2011-06-14 DIAGNOSIS — K219 Gastro-esophageal reflux disease without esophagitis: Secondary | ICD-10-CM | POA: Diagnosis not present

## 2011-06-14 DIAGNOSIS — R112 Nausea with vomiting, unspecified: Secondary | ICD-10-CM | POA: Diagnosis not present

## 2011-11-15 DIAGNOSIS — F411 Generalized anxiety disorder: Secondary | ICD-10-CM | POA: Diagnosis not present

## 2011-11-15 DIAGNOSIS — I739 Peripheral vascular disease, unspecified: Secondary | ICD-10-CM | POA: Diagnosis not present

## 2011-11-15 DIAGNOSIS — J4489 Other specified chronic obstructive pulmonary disease: Secondary | ICD-10-CM | POA: Diagnosis not present

## 2011-11-15 DIAGNOSIS — F17200 Nicotine dependence, unspecified, uncomplicated: Secondary | ICD-10-CM | POA: Diagnosis not present

## 2011-11-15 DIAGNOSIS — B353 Tinea pedis: Secondary | ICD-10-CM | POA: Diagnosis not present

## 2011-11-15 DIAGNOSIS — J449 Chronic obstructive pulmonary disease, unspecified: Secondary | ICD-10-CM | POA: Diagnosis not present

## 2011-11-15 DIAGNOSIS — R1013 Epigastric pain: Secondary | ICD-10-CM | POA: Diagnosis not present

## 2011-11-15 DIAGNOSIS — F259 Schizoaffective disorder, unspecified: Secondary | ICD-10-CM | POA: Diagnosis not present

## 2011-11-15 DIAGNOSIS — G479 Sleep disorder, unspecified: Secondary | ICD-10-CM | POA: Diagnosis not present

## 2011-11-29 DIAGNOSIS — F259 Schizoaffective disorder, unspecified: Secondary | ICD-10-CM | POA: Diagnosis not present

## 2011-11-29 DIAGNOSIS — E559 Vitamin D deficiency, unspecified: Secondary | ICD-10-CM | POA: Diagnosis not present

## 2011-11-29 DIAGNOSIS — J449 Chronic obstructive pulmonary disease, unspecified: Secondary | ICD-10-CM | POA: Diagnosis not present

## 2011-11-29 DIAGNOSIS — K3189 Other diseases of stomach and duodenum: Secondary | ICD-10-CM | POA: Diagnosis not present

## 2011-11-29 DIAGNOSIS — G479 Sleep disorder, unspecified: Secondary | ICD-10-CM | POA: Diagnosis not present

## 2011-11-29 DIAGNOSIS — I739 Peripheral vascular disease, unspecified: Secondary | ICD-10-CM | POA: Diagnosis not present

## 2011-11-29 DIAGNOSIS — F411 Generalized anxiety disorder: Secondary | ICD-10-CM | POA: Diagnosis not present

## 2011-11-29 DIAGNOSIS — F17200 Nicotine dependence, unspecified, uncomplicated: Secondary | ICD-10-CM | POA: Diagnosis not present

## 2011-12-22 DIAGNOSIS — Z79899 Other long term (current) drug therapy: Secondary | ICD-10-CM | POA: Diagnosis not present

## 2011-12-22 DIAGNOSIS — L03119 Cellulitis of unspecified part of limb: Secondary | ICD-10-CM | POA: Diagnosis not present

## 2011-12-22 DIAGNOSIS — M25569 Pain in unspecified knee: Secondary | ICD-10-CM | POA: Diagnosis not present

## 2011-12-22 DIAGNOSIS — F200 Paranoid schizophrenia: Secondary | ICD-10-CM | POA: Diagnosis not present

## 2011-12-22 DIAGNOSIS — L02419 Cutaneous abscess of limb, unspecified: Secondary | ICD-10-CM | POA: Diagnosis not present

## 2011-12-22 DIAGNOSIS — M79609 Pain in unspecified limb: Secondary | ICD-10-CM | POA: Diagnosis not present

## 2011-12-26 DIAGNOSIS — F411 Generalized anxiety disorder: Secondary | ICD-10-CM | POA: Diagnosis not present

## 2011-12-26 DIAGNOSIS — K3189 Other diseases of stomach and duodenum: Secondary | ICD-10-CM | POA: Diagnosis not present

## 2011-12-26 DIAGNOSIS — Z23 Encounter for immunization: Secondary | ICD-10-CM | POA: Diagnosis not present

## 2011-12-26 DIAGNOSIS — K219 Gastro-esophageal reflux disease without esophagitis: Secondary | ICD-10-CM | POA: Diagnosis not present

## 2011-12-26 DIAGNOSIS — E559 Vitamin D deficiency, unspecified: Secondary | ICD-10-CM | POA: Diagnosis not present

## 2011-12-26 DIAGNOSIS — F17200 Nicotine dependence, unspecified, uncomplicated: Secondary | ICD-10-CM | POA: Diagnosis not present

## 2011-12-26 DIAGNOSIS — I739 Peripheral vascular disease, unspecified: Secondary | ICD-10-CM | POA: Diagnosis not present

## 2011-12-26 DIAGNOSIS — J449 Chronic obstructive pulmonary disease, unspecified: Secondary | ICD-10-CM | POA: Diagnosis not present

## 2012-01-04 DIAGNOSIS — F200 Paranoid schizophrenia: Secondary | ICD-10-CM | POA: Diagnosis not present

## 2012-01-30 DIAGNOSIS — I10 Essential (primary) hypertension: Secondary | ICD-10-CM | POA: Diagnosis not present

## 2012-02-01 DIAGNOSIS — F200 Paranoid schizophrenia: Secondary | ICD-10-CM | POA: Diagnosis not present

## 2012-02-22 ENCOUNTER — Encounter (HOSPITAL_COMMUNITY): Payer: Self-pay | Admitting: *Deleted

## 2012-02-22 ENCOUNTER — Emergency Department (HOSPITAL_COMMUNITY): Payer: Medicare Other

## 2012-02-22 ENCOUNTER — Emergency Department (HOSPITAL_COMMUNITY)
Admission: EM | Admit: 2012-02-22 | Discharge: 2012-02-22 | Disposition: A | Discharge status: Home / Self Care | Payer: Medicare Other | Attending: Emergency Medicine | Admitting: Emergency Medicine

## 2012-02-22 DIAGNOSIS — J449 Chronic obstructive pulmonary disease, unspecified: Secondary | ICD-10-CM | POA: Insufficient documentation

## 2012-02-22 DIAGNOSIS — F319 Bipolar disorder, unspecified: Secondary | ICD-10-CM | POA: Insufficient documentation

## 2012-02-22 DIAGNOSIS — Z7982 Long term (current) use of aspirin: Secondary | ICD-10-CM | POA: Diagnosis not present

## 2012-02-22 DIAGNOSIS — S52609A Unspecified fracture of lower end of unspecified ulna, initial encounter for closed fracture: Secondary | ICD-10-CM | POA: Diagnosis not present

## 2012-02-22 DIAGNOSIS — Y9301 Activity, walking, marching and hiking: Secondary | ICD-10-CM | POA: Insufficient documentation

## 2012-02-22 DIAGNOSIS — W108XXA Fall (on) (from) other stairs and steps, initial encounter: Secondary | ICD-10-CM | POA: Insufficient documentation

## 2012-02-22 DIAGNOSIS — S5290XA Unspecified fracture of unspecified forearm, initial encounter for closed fracture: Secondary | ICD-10-CM | POA: Insufficient documentation

## 2012-02-22 DIAGNOSIS — Z79899 Other long term (current) drug therapy: Secondary | ICD-10-CM | POA: Diagnosis not present

## 2012-02-22 DIAGNOSIS — Z87891 Personal history of nicotine dependence: Secondary | ICD-10-CM | POA: Insufficient documentation

## 2012-02-22 DIAGNOSIS — Y92009 Unspecified place in unspecified non-institutional (private) residence as the place of occurrence of the external cause: Secondary | ICD-10-CM | POA: Insufficient documentation

## 2012-02-22 DIAGNOSIS — F209 Schizophrenia, unspecified: Secondary | ICD-10-CM | POA: Diagnosis not present

## 2012-02-22 DIAGNOSIS — S52613A Displaced fracture of unspecified ulna styloid process, initial encounter for closed fracture: Secondary | ICD-10-CM

## 2012-02-22 DIAGNOSIS — J4489 Other specified chronic obstructive pulmonary disease: Secondary | ICD-10-CM | POA: Insufficient documentation

## 2012-02-22 MED ORDER — SODIUM CHLORIDE 0.9 % IV BOLUS (SEPSIS): 500.0000 mL | Freq: Once | INTRAVENOUS | Status: DC

## 2012-02-22 MED ORDER — HYDROCODONE-ACETAMINOPHEN 5-325 MG PO TABS
1.0000 | ORAL_TABLET | Freq: Once | ORAL | Status: AC
  Administered 2012-02-22: 1 via ORAL
  Filled 2012-02-22: qty 1

## 2012-02-22 MED ORDER — HYDROCODONE-ACETAMINOPHEN 5-325 MG PO TABS: 2.0000 | ORAL_TABLET | Freq: Four times a day (QID) | ORAL | Status: DC | PRN

## 2012-02-22 MED ORDER — SODIUM CHLORIDE 0.9 % IV BOLUS (SEPSIS)
1000.0000 mL | Freq: Once | INTRAVENOUS | Status: AC
  Administered 2012-02-22: 1000 mL via INTRAVENOUS

## 2012-02-22 NOTE — ED Notes (Signed)
Pt in from group home with caregiver. Caregiver reports pt missed a step while walking out of house, down one step this am and fell forward. Did not strike head, did not lose consciousness. Pt c/o L wrist pain, swelling noted. Also has swelling over L wrist that caregiver sts was present before fall, Dx as fatty lipoma. Crepitus felt with movement of L wrist and pt c/o increased pain.

## 2012-02-22 NOTE — ED Notes (Signed)
Ortho tech notified regarding arm splint order.

## 2012-02-22 NOTE — ED Provider Notes (Signed)
History     CSN: [id number]  Arrival date & time 02/22/12  4401   First MD Initiated Contact with Patient 02/22/12 1006      Chief Complaint  Patient presents with  . Fall  . Wrist Pain    (Consider location/radiation/quality/duration/timing/severity/associated sxs/prior treatment) HPI Comments: Patient presenting from a Group Home with his Caregiver.  Caregiver reports that patient missed a step while stepping down from a porch area just prior to arrival.  He then landed on concrete.  He put his left hand down to stop from falling.  He then also landed on both of his knees.  Caregiver reports that he did not hit his head.  No LOC.  He was ambulatory after the fall.  He is currently complaining of pain of the left wrist.  Swelling of the dorsal left wrist also present.  He denies pain anywhere else.Marland Kitchen  He is not on any blood thinners.    The history is provided by the patient (Care giver).    Past Medical History  Diagnosis Date  . Bipolar 1 disorder   . Schizo affective schizophrenia   . Asthma   . COPD (chronic obstructive pulmonary disease)     History reviewed. No pertinent past surgical history.  No family history on file.  History  Substance Use Topics  . Smoking status: Former Games developer  . Smokeless tobacco: Not on file  . Alcohol Use: No      Review of Systems  HENT: Negative for neck pain and neck stiffness.   Eyes: Negative for visual disturbance.  Respiratory: Negative for shortness of breath.   Gastrointestinal: Negative for nausea and vomiting.  Musculoskeletal: Negative for back pain and gait problem.       Left wrist pain and swelling  Neurological: Negative for dizziness, syncope, light-headedness and headaches.  Psychiatric/Behavioral: Negative for confusion.    Allergies  Review of patient's allergies indicates no known allergies.  Home Medications   Current Outpatient Rx  Name  Route  Sig  Dispense  Refill  . ALBUTEROL SULFATE HFA 108 (90  BASE) MCG/ACT IN AERS   Inhalation   Inhale 2 puffs into the lungs every 4 (four) hours as needed. As needed for shortness of breath.         . ASPIRIN EC 81 MG PO TBEC   Oral   Take 81 mg by mouth daily.         Marland Kitchen BENZTROPINE MESYLATE 1 MG PO TABS   Oral   Take 1 mg by mouth 2 (two) times daily.         Marland Kitchen CLONAZEPAM 0.5 MG PO TABS   Oral   Take 0.5 mg by mouth 2 (two) times daily as needed. As needed for anxiety.         . ESOMEPRAZOLE MAGNESIUM 40 MG PO CPDR   Oral   Take 40 mg by mouth daily before breakfast.         . HYDROXYZINE HCL 25 MG PO TABS   Oral   Take 25 mg by mouth 3 (three) times daily.         . MELOXICAM 7.5 MG PO TABS   Oral   Take 7.5 mg by mouth daily.         Marland Kitchen OLANZAPINE 10 MG PO TABS   Oral   Take 5 mg by mouth 2 (two) times daily.         Marland Kitchen PALIPERIDONE PALMITATE 156 MG/ML IM SUSP  Intramuscular   Inject 156 mg into the muscle every 30 (thirty) days.         Marland Kitchen RISPERIDONE 4 MG PO TABS   Oral   Take 4 mg by mouth 2 (two) times daily.         . TRAZODONE HCL 50 MG PO TABS   Oral   Take 50 mg by mouth at bedtime.           BP 96/67  Pulse 108  Temp 97.6 F (36.4 C) (Oral)  Resp 23  SpO2 96%  Physical Exam  Nursing note and vitals reviewed. Constitutional: He appears well-developed and well-nourished. No distress.  HENT:  Head: Normocephalic and atraumatic.  Mouth/Throat: Oropharynx is clear and moist.  Eyes: EOM are normal. Pupils are equal, round, and reactive to light.  Neck: Normal range of motion. Neck supple.  Cardiovascular: Normal rate, regular rhythm, normal heart sounds and intact distal pulses.   Pulses:      Radial pulses are 2+ on the right side, and 2+ on the left side.  Pulmonary/Chest: Effort normal and breath sounds normal. No respiratory distress. He has no wheezes. He exhibits no tenderness.  Abdominal: Soft. There is no tenderness.  Musculoskeletal:       Left elbow: He exhibits normal  range of motion, no swelling, no effusion and no deformity. no tenderness found.       Left wrist: He exhibits tenderness, bony tenderness and swelling.       Cervical back: He exhibits no tenderness, no bony tenderness, no swelling, no edema and no deformity.       Thoracic back: He exhibits no tenderness, no bony tenderness, no swelling, no edema and no deformity.       Lumbar back: He exhibits no tenderness, no bony tenderness, no swelling, no edema and no deformity.       Pain with ROM of the left wrist No pain with ROM of the remainder of his extremities.  Neurological: He is alert. No cranial nerve deficit or sensory deficit. Gait normal.  Skin: Skin is warm and dry. He is not diaphoretic.       Small superficial abrasions of both knees  Psychiatric: He has a normal mood and affect.    ED Course  Procedures (including critical care time)  Labs Reviewed - No data to display Dg Wrist Complete Left  02/22/2012  *RADIOLOGY REPORT*  Clinical Data: Fall.  Wrist pain.  LEFT WRIST - COMPLETE 3+ VIEW  Comparison: None.  Findings: Comminuted dorsal Barton's fracture noted.  Transverse fracture of the ulnar styloid might be well corticated and thus age indeterminate.  Overlying focal dorsal soft tissue swelling noted.  IMPRESSION:  1.  Comminuted acute dorsal Barton's fracture. 2.  Transverse fracture of the ulnar styloid, age indeterminate   Original Report Authenticated By: Gaylyn Rong, M.D.      No diagnosis found.  Discussed with Dr. Mina Marble.  He recommends putting the patient in a sugar tong splint and having him follow up in the office tomorrow.  MDM  Patient with closed comminuted fracture of the distal radius and an age indeterminate fracture of the ulnar styloid.  Patient neurovascularly intact.  Dr. Mina Marble with Hand Surgery contacted who recommended putting the patient in sugar tong splint and follow up in the office tomorrow.        Pascal Lux [place], PA-C 02/23/12  1049

## 2012-02-23 ENCOUNTER — Encounter (HOSPITAL_COMMUNITY): Payer: Self-pay | Admitting: Pharmacy Technician

## 2012-02-23 ENCOUNTER — Other Ambulatory Visit: Payer: Self-pay | Admitting: Orthopedic Surgery

## 2012-02-23 DIAGNOSIS — S52599A Other fractures of lower end of unspecified radius, initial encounter for closed fracture: Secondary | ICD-10-CM | POA: Diagnosis not present

## 2012-02-24 ENCOUNTER — Encounter (HOSPITAL_COMMUNITY): Payer: Self-pay

## 2012-02-26 MED ORDER — CHLORHEXIDINE GLUCONATE 4 % EX LIQD: 60.0000 mL | Freq: Once | CUTANEOUS | Status: DC

## 2012-02-27 ENCOUNTER — Encounter (HOSPITAL_COMMUNITY)
Admission: RE | Disposition: A | Discharge status: Home / Self Care | Payer: Self-pay | Source: Ambulatory Visit | Attending: Orthopedic Surgery

## 2012-02-27 ENCOUNTER — Ambulatory Visit (HOSPITAL_COMMUNITY): Payer: Medicare Other | Admitting: Anesthesiology

## 2012-02-27 ENCOUNTER — Encounter (HOSPITAL_COMMUNITY): Payer: Self-pay | Admitting: Anesthesiology

## 2012-02-27 ENCOUNTER — Encounter (HOSPITAL_COMMUNITY): Payer: Self-pay | Admitting: *Deleted

## 2012-02-27 ENCOUNTER — Ambulatory Visit (HOSPITAL_COMMUNITY)
Admission: RE | Admit: 2012-02-27 | Discharge: 2012-02-27 | Disposition: A | Discharge status: Home / Self Care | Payer: Medicare Other | Source: Ambulatory Visit | Attending: Orthopedic Surgery | Admitting: Orthopedic Surgery

## 2012-02-27 DIAGNOSIS — Y92009 Unspecified place in unspecified non-institutional (private) residence as the place of occurrence of the external cause: Secondary | ICD-10-CM | POA: Insufficient documentation

## 2012-02-27 DIAGNOSIS — Z01812 Encounter for preprocedural laboratory examination: Secondary | ICD-10-CM | POA: Diagnosis not present

## 2012-02-27 DIAGNOSIS — Z79899 Other long term (current) drug therapy: Secondary | ICD-10-CM | POA: Insufficient documentation

## 2012-02-27 DIAGNOSIS — K219 Gastro-esophageal reflux disease without esophagitis: Secondary | ICD-10-CM | POA: Diagnosis not present

## 2012-02-27 DIAGNOSIS — Z7982 Long term (current) use of aspirin: Secondary | ICD-10-CM | POA: Diagnosis not present

## 2012-02-27 DIAGNOSIS — J449 Chronic obstructive pulmonary disease, unspecified: Secondary | ICD-10-CM | POA: Insufficient documentation

## 2012-02-27 DIAGNOSIS — S52509A Unspecified fracture of the lower end of unspecified radius, initial encounter for closed fracture: Secondary | ICD-10-CM | POA: Diagnosis present

## 2012-02-27 DIAGNOSIS — S52539A Colles' fracture of unspecified radius, initial encounter for closed fracture: Secondary | ICD-10-CM | POA: Diagnosis not present

## 2012-02-27 DIAGNOSIS — J4489 Other specified chronic obstructive pulmonary disease: Secondary | ICD-10-CM | POA: Insufficient documentation

## 2012-02-27 DIAGNOSIS — S52599A Other fractures of lower end of unspecified radius, initial encounter for closed fracture: Secondary | ICD-10-CM | POA: Diagnosis not present

## 2012-02-27 DIAGNOSIS — W1789XA Other fall from one level to another, initial encounter: Secondary | ICD-10-CM | POA: Insufficient documentation

## 2012-02-27 HISTORY — PX: OPEN REDUCTION INTERNAL FIXATION (ORIF) DISTAL RADIAL FRACTURE: SHX5989

## 2012-02-27 HISTORY — DX: Gastro-esophageal reflux disease without esophagitis: K21.9

## 2012-02-27 HISTORY — PX: CARPAL TUNNEL RELEASE: SHX101

## 2012-02-27 LAB — CBC
HCT: 40.5 % (ref 39.0–52.0)
Hemoglobin: 13.7 g/dL (ref 13.0–17.0)
MCH: 27.5 pg (ref 26.0–34.0)
MCHC: 33.8 g/dL (ref 30.0–36.0)
MCV: 81.2 fL (ref 78.0–100.0)
Platelets: 189 10*3/uL (ref 150–400)
RBC: 4.99 MIL/uL (ref 4.22–5.81)
RDW: 12.8 % (ref 11.5–15.5)
WBC: 6.6 10*3/uL (ref 4.0–10.5)

## 2012-02-27 LAB — SURGICAL PCR SCREEN
MRSA, PCR: NEGATIVE
Staphylococcus aureus: NEGATIVE

## 2012-02-27 SURGERY — OPEN REDUCTION INTERNAL FIXATION (ORIF) DISTAL RADIUS FRACTURE
Anesthesia: General | Site: Wrist | Laterality: Left | Wound class: Clean

## 2012-02-27 MED ORDER — LACTATED RINGERS IV SOLN
INTRAVENOUS | Status: DC
  Administered 2012-02-27: 11:00:00 via INTRAVENOUS

## 2012-02-27 MED ORDER — FENTANYL CITRATE 0.05 MG/ML IJ SOLN
INTRAMUSCULAR | Status: DC | PRN
  Administered 2012-02-27 (×2): 25 ug via INTRAVENOUS
  Administered 2012-02-27: 50 ug via INTRAVENOUS
  Administered 2012-02-27: 25 ug via INTRAVENOUS

## 2012-02-27 MED ORDER — LIDOCAINE HCL (CARDIAC) 20 MG/ML IV SOLN
INTRAVENOUS | Status: DC | PRN
  Administered 2012-02-27: 100 mg via INTRAVENOUS

## 2012-02-27 MED ORDER — CEFAZOLIN SODIUM 1-5 GM-% IV SOLN
INTRAVENOUS | Status: AC
  Filled 2012-02-27: qty 100

## 2012-02-27 MED ORDER — CEFAZOLIN SODIUM 1-5 GM-% IV SOLN
INTRAVENOUS | Status: DC | PRN
  Administered 2012-02-27: 2 g via INTRAVENOUS

## 2012-02-27 MED ORDER — MUPIROCIN 2 % EX OINT
TOPICAL_OINTMENT | Freq: Two times a day (BID) | CUTANEOUS | Status: DC
  Administered 2012-02-27: 1 via NASAL
  Filled 2012-02-27 (×2): qty 22

## 2012-02-27 MED ORDER — OXYCODONE HCL 5 MG PO TABS: 5.0000 mg | ORAL_TABLET | Freq: Once | ORAL | Status: DC | PRN

## 2012-02-27 MED ORDER — OXYCODONE HCL 5 MG/5ML PO SOLN: 5.0000 mg | Freq: Once | ORAL | Status: DC | PRN

## 2012-02-27 MED ORDER — LACTATED RINGERS IV SOLN
INTRAVENOUS | Status: DC | PRN
  Administered 2012-02-27: 12:00:00 via INTRAVENOUS

## 2012-02-27 MED ORDER — BUPIVACAINE HCL (PF) 0.25 % IJ SOLN
INTRAMUSCULAR | Status: AC
  Filled 2012-02-27: qty 30

## 2012-02-27 MED ORDER — OXYCODONE-ACETAMINOPHEN 5-325 MG PO TABS: 1.0000 | ORAL_TABLET | ORAL | Status: DC | PRN

## 2012-02-27 MED ORDER — 0.9 % SODIUM CHLORIDE (POUR BTL) OPTIME
TOPICAL | Status: DC | PRN
  Administered 2012-02-27: 1000 mL

## 2012-02-27 MED ORDER — PROPOFOL 10 MG/ML IV BOLUS
INTRAVENOUS | Status: DC | PRN
  Administered 2012-02-27: 200 mg via INTRAVENOUS

## 2012-02-27 MED ORDER — ONDANSETRON HCL 4 MG/2ML IJ SOLN
INTRAMUSCULAR | Status: DC | PRN
  Administered 2012-02-27: 4 mg via INTRAVENOUS

## 2012-02-27 MED ORDER — BUPIVACAINE HCL (PF) 0.25 % IJ SOLN
INTRAMUSCULAR | Status: DC | PRN
  Administered 2012-02-27: 30 mL

## 2012-02-27 MED ORDER — MIDAZOLAM HCL 5 MG/5ML IJ SOLN
INTRAMUSCULAR | Status: DC | PRN
  Administered 2012-02-27: 2 mg via INTRAVENOUS

## 2012-02-27 MED ORDER — HYDROMORPHONE HCL PF 1 MG/ML IJ SOLN: 0.2500 mg | INTRAMUSCULAR | Status: DC | PRN

## 2012-02-27 MED ORDER — PROMETHAZINE HCL 25 MG/ML IJ SOLN: 6.2500 mg | INTRAMUSCULAR | Status: DC | PRN

## 2012-02-27 SURGICAL SUPPLY — 69 items
APL SKNCLS STERI-STRIP NONHPOA (GAUZE/BANDAGES/DRESSINGS) ×1
BANDAGE ELASTIC 3 VELCRO ST LF (GAUZE/BANDAGES/DRESSINGS) ×2 IMPLANT
BANDAGE ELASTIC 4 VELCRO ST LF (GAUZE/BANDAGES/DRESSINGS) ×2 IMPLANT
BANDAGE GAUZE ELAST BULKY 4 IN (GAUZE/BANDAGES/DRESSINGS) ×2 IMPLANT
BENZOIN TINCTURE PRP APPL 2/3 (GAUZE/BANDAGES/DRESSINGS) ×2 IMPLANT
BIT DRILL 2.5X4 QC (BIT) ×2 IMPLANT
BNDG CMPR 9X4 STRL LF SNTH (GAUZE/BANDAGES/DRESSINGS) ×1
BNDG ESMARK 4X9 LF (GAUZE/BANDAGES/DRESSINGS) ×2 IMPLANT
CLOTH BEACON ORANGE TIMEOUT ST (SAFETY) ×2 IMPLANT
CLSR STERI-STRIP ANTIMIC 1/2X4 (GAUZE/BANDAGES/DRESSINGS) ×2 IMPLANT
CORDS BIPOLAR (ELECTRODE) ×2 IMPLANT
COVER MAYO STAND STRL (DRAPES) IMPLANT
COVER SURGICAL LIGHT HANDLE (MISCELLANEOUS) ×2 IMPLANT
CUFF TOURNIQUET SINGLE 18IN (TOURNIQUET CUFF) ×2 IMPLANT
CUFF TOURNIQUET SINGLE 24IN (TOURNIQUET CUFF) IMPLANT
DRAPE C-ARM MINI 42X72 WSTRAPS (DRAPES) ×2 IMPLANT
DRAPE OEC MINIVIEW 54X84 (DRAPES) IMPLANT
DRAPE SURG 17X23 STRL (DRAPES) ×2 IMPLANT
DURAPREP 26ML APPLICATOR (WOUND CARE) ×2 IMPLANT
ELECT REM PT RETURN 9FT ADLT (ELECTROSURGICAL)
ELECTRODE REM PT RTRN 9FT ADLT (ELECTROSURGICAL) IMPLANT
GAUZE XEROFORM 1X8 LF (GAUZE/BANDAGES/DRESSINGS) ×2 IMPLANT
GLOVE BIO SURGEON STRL SZ8.5 (GLOVE) ×2 IMPLANT
GOWN PREVENTION PLUS XXLARGE (GOWN DISPOSABLE) ×2 IMPLANT
GOWN SRG XL XLNG 56XLVL 4 (GOWN DISPOSABLE) ×1 IMPLANT
GOWN STRL NON-REIN LRG LVL3 (GOWN DISPOSABLE) ×2 IMPLANT
GOWN STRL NON-REIN XL XLG LVL4 (GOWN DISPOSABLE) ×2
KIT BASIN OR (CUSTOM PROCEDURE TRAY) ×2 IMPLANT
KIT ROOM TURNOVER OR (KITS) ×2 IMPLANT
MANIFOLD NEPTUNE II (INSTRUMENTS) ×2 IMPLANT
NEEDLE HYPO 25GX1X1/2 BEV (NEEDLE) IMPLANT
NS IRRIG 1000ML POUR BTL (IV SOLUTION) ×2 IMPLANT
PACK ORTHO EXTREMITY (CUSTOM PROCEDURE TRAY) ×2 IMPLANT
PAD ARMBOARD 7.5X6 YLW CONV (MISCELLANEOUS) ×4 IMPLANT
PAD CAST 3X4 CTTN HI CHSV (CAST SUPPLIES) ×1 IMPLANT
PAD CAST 4YDX4 CTTN HI CHSV (CAST SUPPLIES) ×1 IMPLANT
PADDING CAST COTTON 3X4 STRL (CAST SUPPLIES) ×2
PADDING CAST COTTON 4X4 STRL (CAST SUPPLIES) ×2
PEG SUBCHONDRAL SMOOTH 2.0X24 (Peg) ×10 IMPLANT
PEG SUBCHONDRAL SMOOTH 2.0X26 (Peg) ×4 IMPLANT
PENCIL BUTTON HOLSTER BLD 10FT (ELECTRODE) IMPLANT
PLATE STAN 24.4X59.5 LT (Plate) ×2 IMPLANT
SCREW BN 12X3.5XNS CORT TI (Screw) ×1 IMPLANT
SCREW CORT 3.5X12 (Screw) ×2 IMPLANT
SCREW CORT 3.5X14 LNG (Screw) ×4 IMPLANT
SPLINT PLASTER CAST XFAST 4X15 (CAST SUPPLIES) ×1 IMPLANT
SPLINT PLASTER EXTRA FAST 3X15 (CAST SUPPLIES) ×1
SPLINT PLASTER GYPS XFAST 3X15 (CAST SUPPLIES) ×1 IMPLANT
SPLINT PLASTER XTRA FAST SET 4 (CAST SUPPLIES) ×1
SPONGE GAUZE 4X4 12PLY (GAUZE/BANDAGES/DRESSINGS) ×2 IMPLANT
SPONGE LAP 4X18 X RAY DECT (DISPOSABLE) IMPLANT
SPONGE SCRUB IODOPHOR (GAUZE/BANDAGES/DRESSINGS) ×2 IMPLANT
STAPLER VISISTAT (STAPLE) ×2 IMPLANT
STRIP CLOSURE SKIN 1/2X4 (GAUZE/BANDAGES/DRESSINGS) ×2 IMPLANT
SUT ETHILON 4 0 PS 2 18 (SUTURE) IMPLANT
SUT PROLENE 3 0 PS 2 (SUTURE) IMPLANT
SUT VIC AB 2-0 CT1 27 (SUTURE) ×2
SUT VIC AB 2-0 CT1 TAPERPNT 27 (SUTURE) ×1 IMPLANT
SUT VIC AB 3-0 FS2 27 (SUTURE) IMPLANT
SUT VIC AB 3-0 PS2 18 (SUTURE)
SUT VIC AB 3-0 PS2 18XBRD (SUTURE) IMPLANT
SUT VICRYL 4-0 PS2 18IN ABS (SUTURE) ×2 IMPLANT
SUT VICRYL RAPIDE 4/0 PS 2 (SUTURE) ×2 IMPLANT
SYR CONTROL 10ML LL (SYRINGE) IMPLANT
TOWEL OR 17X24 6PK STRL BLUE (TOWEL DISPOSABLE) ×2 IMPLANT
TOWEL OR 17X26 10 PK STRL BLUE (TOWEL DISPOSABLE) ×2 IMPLANT
TUBE CONNECTING 12X1/4 (SUCTIONS) IMPLANT
UNDERPAD 30X30 INCONTINENT (UNDERPADS AND DIAPERS) ×2 IMPLANT
WATER STERILE IRR 1000ML POUR (IV SOLUTION) ×2 IMPLANT

## 2012-02-27 NOTE — Anesthesia Postprocedure Evaluation (Signed)
Anesthesia Post Note  Patient: Lee House  Procedure(s) Performed: Procedure(s) (LRB): OPEN REDUCTION INTERNAL FIXATION (ORIF) DISTAL RADIAL FRACTURE (Left) CARPAL TUNNEL RELEASE (Left)  Anesthesia type: general  Patient location: PACU  Post pain: Pain level controlled  Post assessment: Patient's Cardiovascular Status Stable  Last Vitals:  Filed Vitals:   02/27/12 1324  BP: 116/72  Pulse: 95  Temp: 34.4 C  Resp: 12    Post vital signs: Reviewed and stable  Level of consciousness: sedated  Complications: No apparent anesthesia complications

## 2012-02-27 NOTE — Anesthesia Preprocedure Evaluation (Addendum)
Anesthesia Evaluation  Patient identified by MRN, date of birth, ID band Patient confused    Reviewed: Allergy & Precautions, H&P , NPO status , Patient's Chart, lab work & pertinent test results, reviewed documented beta blocker date and time   History of Anesthesia Complications Negative for: history of anesthetic complications  Airway Mallampati: II TM Distance: >3 FB Neck ROM: Full    Dental  (+) Poor Dentition and Dental Advisory Given   Pulmonary asthma , COPD COPD inhaler, former smoker,    Pulmonary exam normal       Cardiovascular negative cardio ROS      Neuro/Psych PSYCHIATRIC DISORDERS Bipolar Disorder negative neurological ROS     GI/Hepatic Neg liver ROS, GERD-  Medicated,  Endo/Other  negative endocrine ROS  Renal/GU negative Renal ROS     Musculoskeletal   Abdominal   Peds  Hematology   Anesthesia Other Findings   Reproductive/Obstetrics                          Anesthesia Physical Anesthesia Plan  ASA: III  Anesthesia Plan: General   Post-op Pain Management:    Induction: Intravenous  Airway Management Planned: LMA  Additional Equipment:   Intra-op Plan:   Post-operative Plan: Extubation in OR  Informed Consent: I have reviewed the patients History and Physical, chart, labs and discussed the procedure including the risks, benefits and alternatives for the proposed anesthesia with the patient or authorized representative who has indicated his/her understanding and acceptance.   Dental advisory given and Consent reviewed with POA  Plan Discussed with: CRNA, Anesthesiologist and Surgeon  Anesthesia Plan Comments:         Anesthesia Quick Evaluation

## 2012-02-27 NOTE — Op Note (Signed)
See note [id number]

## 2012-02-27 NOTE — Progress Notes (Signed)
Spoke with Tresa Endo at group home where patient lives. He stated that pt took all of his meds this am and has not had anything to eat or drink since midnight. Stated he refused to leave the cast on his arm and he will need to be watched closely post op to make sure he doesn't remove any bandages or casts. Also stated that patient has an appt scheduled with his psychiatrist tomorrow because the group home has had some concerns regarding his mental status recently.

## 2012-02-27 NOTE — H&P (Signed)
Lee House is an 46 y.o. male.   Chief Complaint: left wrist pain an deformity HPI: as above s/p fall of porch with displaed lrft distal radius fracture  Past Medical History  Diagnosis Date  . Bipolar 1 disorder   . Schizo affective schizophrenia   . Asthma   . COPD (chronic obstructive pulmonary disease)   . GERD (gastroesophageal reflux disease)     History reviewed. No pertinent past surgical history.  History reviewed. No pertinent family history. Social History:  reports that he has quit smoking. He does not have any smokeless tobacco history on file. He reports that he does not drink alcohol or use illicit drugs.  Allergies: No Known Allergies  Medications Prior to Admission  Medication Sig Dispense Refill  . albuterol (PROVENTIL HFA;VENTOLIN HFA) 108 (90 BASE) MCG/ACT inhaler Inhale 2 puffs into the lungs every 4 (four) hours as needed. As needed for shortness of breath.      Marland Kitchen aspirin EC 81 MG tablet Take 81 mg by mouth daily.      . benztropine (COGENTIN) 1 MG tablet Take 1 mg by mouth 2 (two) times daily.      . clonazePAM (KLONOPIN) 1 MG tablet Take 1 mg by mouth 2 (two) times daily.      Marland Kitchen esomeprazole (NEXIUM) 40 MG capsule Take 40 mg by mouth daily before breakfast.      . Fluticasone-Salmeterol (ADVAIR) 250-50 MCG/DOSE AEPB Inhale 1 puff into the lungs every 12 (twelve) hours.      Marland Kitchen HYDROcodone-acetaminophen (NORCO/VICODIN) 5-325 MG per tablet Take 2 tablets by mouth every 6 (six) hours as needed for pain.  15 tablet  0  . ketoconazole (NIZORAL) 2 % cream Apply 1 application topically daily as needed. For skin irration and contact dermatitis      . meloxicam (MOBIC) 7.5 MG tablet Take 7.5 mg by mouth daily.      Marland Kitchen OLANZapine (ZYPREXA) 10 MG tablet Take 10 mg by mouth 2 (two) times daily.       . Paliperidone Palmitate (INVEGA SUSTENNA) 156 MG/ML SUSP Inject 156 mg into the muscle every 30 (thirty) days.      . risperidone (RISPERDAL) 4 MG tablet Take 4 mg  by mouth 2 (two) times daily.      . traZODone (DESYREL) 50 MG tablet Take 50 mg by mouth at bedtime.        Results for orders placed during the hospital encounter of 02/27/12 (from the past 48 hour(s))  CBC     Status: Normal   Collection Time   02/27/12  9:22 AM      Component Value Range Comment   WBC 6.6  4.0 - 10.5 K/uL    RBC 4.99  4.22 - 5.81 MIL/uL    Hemoglobin 13.7  13.0 - 17.0 g/dL    HCT 11.9  14.7 - 82.9 %    MCV 81.2  78.0 - 100.0 fL    MCH 27.5  26.0 - 34.0 pg    MCHC 33.8  30.0 - 36.0 g/dL    RDW 56.2  13.0 - 86.5 %    Platelets 189  150 - 400 K/uL   SURGICAL PCR SCREEN     Status: Normal   Collection Time   02/27/12  9:44 AM      Component Value Range Comment   MRSA, PCR NEGATIVE  NEGATIVE    Staphylococcus aureus NEGATIVE  NEGATIVE    No results found.  Review of Systems  All other systems reviewed and are negative.    Blood pressure 135/82, pulse 93, temperature 97.7 F (36.5 C), temperature source Oral, resp. rate 18, height 6\' 1"  (1.854 m), weight 83.915 kg (185 lb), SpO2 99.00%. Physical Exam  Constitutional: He appears well-developed and well-nourished.  HENT:  Head: Normocephalic and atraumatic.  Cardiovascular: Normal rate.   Respiratory: Effort normal.  Musculoskeletal:       Left wrist: He exhibits bony tenderness, swelling and deformity.       Arms: Neurological: He is alert.     Assessment/Plan As above  Plan orif  Alphonso Gregson A 02/27/2012, 12:00 PM

## 2012-02-27 NOTE — Anesthesia Procedure Notes (Signed)
Procedure Name: LMA Insertion Date/Time: 02/27/2012 12:14 PM Performed by: Margaree Mackintosh Pre-anesthesia Checklist: Patient identified, Timeout performed, Emergency Drugs available, Suction available and Patient being monitored Patient Re-evaluated:Patient Re-evaluated prior to inductionOxygen Delivery Method: Circle system utilized Preoxygenation: Pre-oxygenation with 100% oxygen Intubation Type: IV induction LMA: LMA inserted LMA Size: 5.0 Number of attempts: 1 Placement Confirmation: positive ETCO2 and breath sounds checked- equal and bilateral Tube secured with: Tape Dental Injury: Teeth and Oropharynx as per pre-operative assessment

## 2012-02-27 NOTE — Brief Op Note (Signed)
02/27/2012  1:20 PM  PATIENT:  Lee House  46 y.o. male  PRE-OPERATIVE DIAGNOSIS:  Distal radius fracture on left.  POST-OPERATIVE DIAGNOSIS:  Distal radius fracture on left.   PROCEDURE:  Procedure(s) (LRB) with comments: OPEN REDUCTION INTERNAL FIXATION (ORIF) DISTAL RADIAL FRACTURE (Left) - Open reduction internal fixation of  left distal radius fracture.  CARPAL TUNNEL RELEASE (Left)  SURGEON:  Surgeon(s) and Role:    * Marlowe Shores, MD - Primary  PHYSICIAN ASSISTANT:   ASSISTANTS: none   ANESTHESIA:   none  EBL:  Total I/O In: 800 [I.V.:800] Out: 10 [Blood:10]  BLOOD ADMINISTERED:none  DRAINS: none   LOCAL MEDICATIONS USED:  MARCAINE   10cc  SPECIMEN:  No Specimen  DISPOSITION OF SPECIMEN:  N/A  COUNTS:  YES  TOURNIQUET:  * Missing tourniquet times found for documented tourniquets in log:  [id number] *  DICTATION: .Other Dictation: Dictation Number [id number]  PLAN OF CARE: Discharge to home after PACU  PATIENT DISPOSITION:  PACU - hemodynamically stable.   Delay start of Pharmacological VTE agent (>24hrs) due to surgical blood loss or risk of bleeding: not applicable

## 2012-02-27 NOTE — Preoperative (Signed)
Beta Blockers   Reason not to administer Beta Blockers:Not Applicable 

## 2012-02-27 NOTE — Transfer of Care (Signed)
Immediate Anesthesia Transfer of Care Note  Patient: Lee House  Procedure(s) Performed: Procedure(s) (LRB) with comments: OPEN REDUCTION INTERNAL FIXATION (ORIF) DISTAL RADIAL FRACTURE (Left) - Open reduction internal fixation of  left distal radius fracture.  CARPAL TUNNEL RELEASE (Left)  Patient Location: PACU  Anesthesia Type:General  Level of Consciousness: sedated  Airway & Oxygen Therapy: Patient Spontanous Breathing and Patient connected to nasal cannula oxygen  Post-op Assessment: Report given to PACU RN and Post -op Vital signs reviewed and stable  Post vital signs: Reviewed and stable  Complications: No apparent anesthesia complications

## 2012-02-28 ENCOUNTER — Encounter (HOSPITAL_COMMUNITY): Payer: Self-pay | Admitting: Orthopedic Surgery

## 2012-02-28 NOTE — ED Provider Notes (Signed)
Medical screening examination/treatment/procedure(s) were performed by non-physician practitioner and as supervising physician I was immediately available for consultation/collaboration.   Ilina Xu L Javonta Gronau, MD 02/28/12 1417 

## 2012-02-28 NOTE — Op Note (Signed)
NAME:  CAEDON, BOND NO.:  [id number]  MEDICAL RECORD NO.:  [id number]  LOCATION:  [facility]                         FACILITY:  [facility]  PHYSICIAN:  Artist Pais. Moniqua Engebretsen, M.D.DATE OF BIRTH:  Jul 28, 1965  DATE OF PROCEDURE:  02/27/2012 DATE OF DISCHARGE:  02/27/2012                              OPERATIVE REPORT   PREOPERATIVE DIAGNOSIS:  Displaced intra-articular fracture, distal radius fracture, left side.  POSTOPERATIVE DIAGNOSIS:  Displaced intra-articular fracture, distal radius fracture, left side.  PROCEDURE:  Open reduction and internal fixation above using DVR plate and screws as well as release of median nerve.  SURGEON:  Artist Pais. Mina Marble, M.D.  ASSISTANT:  None.  ANESTHESIA:  General.  COMPLICATIONS:  No complications.  DRAINS:  No drains.  DESCRIPTION OF PROCEDURE:  The patient was taken to the operating suite after induction of adequate general anesthesia.  Left upper extremity prepped and draped in usual sterile fashion.  An Esmarch was used to exsanguinate the limb.  Tourniquet was then inflated to 250 mmHg.  At this point in time, an incision was made along the volar aspect of the distal radius and distal forearm area, left side.  Skin incised longitudinally of the FCR tendon.  The sheath overlying the FCR was incised.  The FCR was retracted to the medial side.  The radial artery to the lateral side.  This level was developed down to the level of pronator quadratus.  The pronator quadratus was subperiosteally stripped off the distal radius revealing the fracture site, which was complex intra-articular significant comminution volarly including a large volar displaced fragment that was free-floating.  The brachioradialis was released off the distal fragment to aid in reduction.  Once this was done, flexion and ulnar deviation was used to reduce the fracture including repositioning and placement of this volar fragment.  Once this was done, a  standard DVR plate was placed on the lower aspect distal radius.  It was fixed to the slotted hole.  Intraoperative fluoroscopy was used to determine adequate positioning.  Once this was determined, remaining cortical screws were placed proximally followed by smooth pegs distally.  Intraoperative fluoroscopy revealed adequate reduction in the AP, lateral, and oblique view.  The wound was irrigated.  The pronator quadratus was closed with 2-0 undyed Vicryl.  The median nerve was identified at the proximal aspect of the wound tracing the carpal canal. The carpal canal was carefully entered using a Therapist, nutritional.  Path was created dorsal and volar to the transverse carpal ligament, it was then divided from proximal and distal under direct vision.  The skin was then closed with 4-0 Vicryl Rapide suture followed by staples, Steri-Strips, 4x4s, fluffs, and a volar splint was applied.  The patient tolerated the procedure well, went to the recovery room in a stable fashion.     Artist Pais Mina Marble, M.D.     MAW/MEDQ  D:  02/27/2012  T:  02/28/2012  Job:  [id number]

## 2012-02-29 DIAGNOSIS — F200 Paranoid schizophrenia: Secondary | ICD-10-CM | POA: Diagnosis not present

## 2012-03-01 DIAGNOSIS — S52599A Other fractures of lower end of unspecified radius, initial encounter for closed fracture: Secondary | ICD-10-CM | POA: Diagnosis not present

## 2012-03-02 DIAGNOSIS — F200 Paranoid schizophrenia: Secondary | ICD-10-CM | POA: Diagnosis not present

## 2012-03-02 DIAGNOSIS — F259 Schizoaffective disorder, unspecified: Secondary | ICD-10-CM | POA: Diagnosis not present

## 2012-03-02 DIAGNOSIS — Z79899 Other long term (current) drug therapy: Secondary | ICD-10-CM | POA: Diagnosis not present

## 2012-03-02 DIAGNOSIS — E876 Hypokalemia: Secondary | ICD-10-CM | POA: Diagnosis not present

## 2012-03-02 DIAGNOSIS — F70 Mild intellectual disabilities: Secondary | ICD-10-CM | POA: Diagnosis not present

## 2012-03-03 DIAGNOSIS — F259 Schizoaffective disorder, unspecified: Secondary | ICD-10-CM | POA: Diagnosis not present

## 2012-03-27 DIAGNOSIS — S52599A Other fractures of lower end of unspecified radius, initial encounter for closed fracture: Secondary | ICD-10-CM | POA: Diagnosis not present

## 2012-04-05 DIAGNOSIS — A499 Bacterial infection, unspecified: Secondary | ICD-10-CM | POA: Diagnosis not present

## 2012-04-05 DIAGNOSIS — E559 Vitamin D deficiency, unspecified: Secondary | ICD-10-CM | POA: Diagnosis not present

## 2012-04-05 DIAGNOSIS — J449 Chronic obstructive pulmonary disease, unspecified: Secondary | ICD-10-CM | POA: Diagnosis not present

## 2012-04-05 DIAGNOSIS — K3189 Other diseases of stomach and duodenum: Secondary | ICD-10-CM | POA: Diagnosis not present

## 2012-04-05 DIAGNOSIS — N390 Urinary tract infection, site not specified: Secondary | ICD-10-CM | POA: Diagnosis not present

## 2012-04-05 DIAGNOSIS — F411 Generalized anxiety disorder: Secondary | ICD-10-CM | POA: Diagnosis not present

## 2012-04-05 DIAGNOSIS — F259 Schizoaffective disorder, unspecified: Secondary | ICD-10-CM | POA: Diagnosis not present

## 2012-04-05 DIAGNOSIS — I119 Hypertensive heart disease without heart failure: Secondary | ICD-10-CM | POA: Diagnosis not present

## 2012-04-05 DIAGNOSIS — K219 Gastro-esophageal reflux disease without esophagitis: Secondary | ICD-10-CM | POA: Diagnosis not present

## 2012-04-05 DIAGNOSIS — F17200 Nicotine dependence, unspecified, uncomplicated: Secondary | ICD-10-CM | POA: Diagnosis not present

## 2012-04-09 DIAGNOSIS — F70 Mild intellectual disabilities: Secondary | ICD-10-CM | POA: Diagnosis not present

## 2012-04-09 DIAGNOSIS — K209 Esophagitis, unspecified without bleeding: Secondary | ICD-10-CM | POA: Diagnosis not present

## 2012-04-09 DIAGNOSIS — Z23 Encounter for immunization: Secondary | ICD-10-CM | POA: Diagnosis not present

## 2012-04-09 DIAGNOSIS — R0989 Other specified symptoms and signs involving the circulatory and respiratory systems: Secondary | ICD-10-CM | POA: Diagnosis not present

## 2012-04-09 DIAGNOSIS — E872 Acidosis: Secondary | ICD-10-CM | POA: Diagnosis not present

## 2012-04-09 DIAGNOSIS — E860 Dehydration: Secondary | ICD-10-CM | POA: Diagnosis not present

## 2012-04-09 DIAGNOSIS — K2210 Ulcer of esophagus without bleeding: Secondary | ICD-10-CM | POA: Diagnosis not present

## 2012-04-09 DIAGNOSIS — F259 Schizoaffective disorder, unspecified: Secondary | ICD-10-CM | POA: Diagnosis not present

## 2012-04-09 DIAGNOSIS — K5669 Other intestinal obstruction: Secondary | ICD-10-CM | POA: Diagnosis not present

## 2012-04-09 DIAGNOSIS — K2100 Gastro-esophageal reflux disease with esophagitis, without bleeding: Secondary | ICD-10-CM | POA: Diagnosis not present

## 2012-04-09 DIAGNOSIS — R404 Transient alteration of awareness: Secondary | ICD-10-CM | POA: Diagnosis not present

## 2012-04-09 DIAGNOSIS — R195 Other fecal abnormalities: Secondary | ICD-10-CM | POA: Diagnosis not present

## 2012-04-09 DIAGNOSIS — R112 Nausea with vomiting, unspecified: Secondary | ICD-10-CM | POA: Diagnosis not present

## 2012-04-09 DIAGNOSIS — I498 Other specified cardiac arrhythmias: Secondary | ICD-10-CM | POA: Diagnosis not present

## 2012-04-09 DIAGNOSIS — D126 Benign neoplasm of colon, unspecified: Secondary | ICD-10-CM | POA: Diagnosis present

## 2012-04-09 DIAGNOSIS — R5383 Other fatigue: Secondary | ICD-10-CM | POA: Diagnosis not present

## 2012-04-09 DIAGNOSIS — J449 Chronic obstructive pulmonary disease, unspecified: Secondary | ICD-10-CM | POA: Diagnosis present

## 2012-04-09 DIAGNOSIS — K922 Gastrointestinal hemorrhage, unspecified: Secondary | ICD-10-CM | POA: Diagnosis not present

## 2012-04-09 DIAGNOSIS — E871 Hypo-osmolality and hyponatremia: Secondary | ICD-10-CM | POA: Diagnosis not present

## 2012-04-09 DIAGNOSIS — K56609 Unspecified intestinal obstruction, unspecified as to partial versus complete obstruction: Secondary | ICD-10-CM | POA: Diagnosis not present

## 2012-04-09 DIAGNOSIS — F411 Generalized anxiety disorder: Secondary | ICD-10-CM | POA: Diagnosis present

## 2012-04-09 DIAGNOSIS — K2970 Gastritis, unspecified, without bleeding: Secondary | ICD-10-CM | POA: Diagnosis present

## 2012-04-09 DIAGNOSIS — D509 Iron deficiency anemia, unspecified: Secondary | ICD-10-CM | POA: Diagnosis not present

## 2012-04-09 DIAGNOSIS — R4182 Altered mental status, unspecified: Secondary | ICD-10-CM | POA: Diagnosis not present

## 2012-04-09 DIAGNOSIS — K920 Hematemesis: Secondary | ICD-10-CM | POA: Diagnosis not present

## 2012-04-09 DIAGNOSIS — N179 Acute kidney failure, unspecified: Secondary | ICD-10-CM | POA: Diagnosis not present

## 2012-04-09 DIAGNOSIS — D649 Anemia, unspecified: Secondary | ICD-10-CM | POA: Diagnosis present

## 2012-04-23 DIAGNOSIS — F411 Generalized anxiety disorder: Secondary | ICD-10-CM | POA: Diagnosis not present

## 2012-04-23 DIAGNOSIS — K3189 Other diseases of stomach and duodenum: Secondary | ICD-10-CM | POA: Diagnosis not present

## 2012-04-23 DIAGNOSIS — D7289 Other specified disorders of white blood cells: Secondary | ICD-10-CM | POA: Diagnosis not present

## 2012-04-23 DIAGNOSIS — R1013 Epigastric pain: Secondary | ICD-10-CM | POA: Diagnosis not present

## 2012-04-23 DIAGNOSIS — J449 Chronic obstructive pulmonary disease, unspecified: Secondary | ICD-10-CM | POA: Diagnosis not present

## 2012-04-23 DIAGNOSIS — F17200 Nicotine dependence, unspecified, uncomplicated: Secondary | ICD-10-CM | POA: Diagnosis not present

## 2012-04-23 DIAGNOSIS — I119 Hypertensive heart disease without heart failure: Secondary | ICD-10-CM | POA: Diagnosis not present

## 2012-04-23 DIAGNOSIS — E559 Vitamin D deficiency, unspecified: Secondary | ICD-10-CM | POA: Diagnosis not present

## 2012-05-29 DIAGNOSIS — F259 Schizoaffective disorder, unspecified: Secondary | ICD-10-CM | POA: Diagnosis not present

## 2012-05-29 DIAGNOSIS — G479 Sleep disorder, unspecified: Secondary | ICD-10-CM | POA: Diagnosis not present

## 2012-05-29 DIAGNOSIS — R1013 Epigastric pain: Secondary | ICD-10-CM | POA: Diagnosis not present

## 2012-05-29 DIAGNOSIS — I119 Hypertensive heart disease without heart failure: Secondary | ICD-10-CM | POA: Diagnosis not present

## 2012-05-29 DIAGNOSIS — B353 Tinea pedis: Secondary | ICD-10-CM | POA: Diagnosis not present

## 2012-05-29 DIAGNOSIS — D649 Anemia, unspecified: Secondary | ICD-10-CM | POA: Diagnosis not present

## 2012-05-29 DIAGNOSIS — J449 Chronic obstructive pulmonary disease, unspecified: Secondary | ICD-10-CM | POA: Diagnosis not present

## 2012-05-29 DIAGNOSIS — E559 Vitamin D deficiency, unspecified: Secondary | ICD-10-CM | POA: Diagnosis not present

## 2012-05-29 DIAGNOSIS — F411 Generalized anxiety disorder: Secondary | ICD-10-CM | POA: Diagnosis not present

## 2012-05-29 DIAGNOSIS — K219 Gastro-esophageal reflux disease without esophagitis: Secondary | ICD-10-CM | POA: Diagnosis not present

## 2012-05-29 DIAGNOSIS — I739 Peripheral vascular disease, unspecified: Secondary | ICD-10-CM | POA: Diagnosis not present

## 2012-05-29 DIAGNOSIS — F17200 Nicotine dependence, unspecified, uncomplicated: Secondary | ICD-10-CM | POA: Diagnosis not present

## 2012-06-06 ENCOUNTER — Telehealth: Payer: Self-pay | Admitting: Oncology

## 2012-06-06 DIAGNOSIS — F200 Paranoid schizophrenia: Secondary | ICD-10-CM | POA: Diagnosis not present

## 2012-06-06 NOTE — Telephone Encounter (Signed)
S/W Erma Heritage in re to NP appt for Lee House w/Dr. Gaylyn Rong on 03/17 @ 10:30 Referring Dr. Jackie Plum Dx-Anemia Welcome packet mailed

## 2012-06-06 NOTE — Telephone Encounter (Signed)
C/D 06/06/12 for appt. 07/02/12 °

## 2012-06-11 DIAGNOSIS — F200 Paranoid schizophrenia: Secondary | ICD-10-CM | POA: Diagnosis not present

## 2012-07-02 ENCOUNTER — Ambulatory Visit: Payer: Medicare Other

## 2012-07-02 ENCOUNTER — Ambulatory Visit: Payer: Medicare Other | Admitting: Oncology

## 2012-07-02 ENCOUNTER — Other Ambulatory Visit: Payer: Medicare Other | Admitting: Lab

## 2012-07-02 IMAGING — CR DG CHEST 2V
1 series · 1 of 1 positions shown · non-contrast
Comparison: None

CLINICAL DATA: Altered mental status, COPD, asthma.

CHEST - 2 VIEW

[view not recorded]
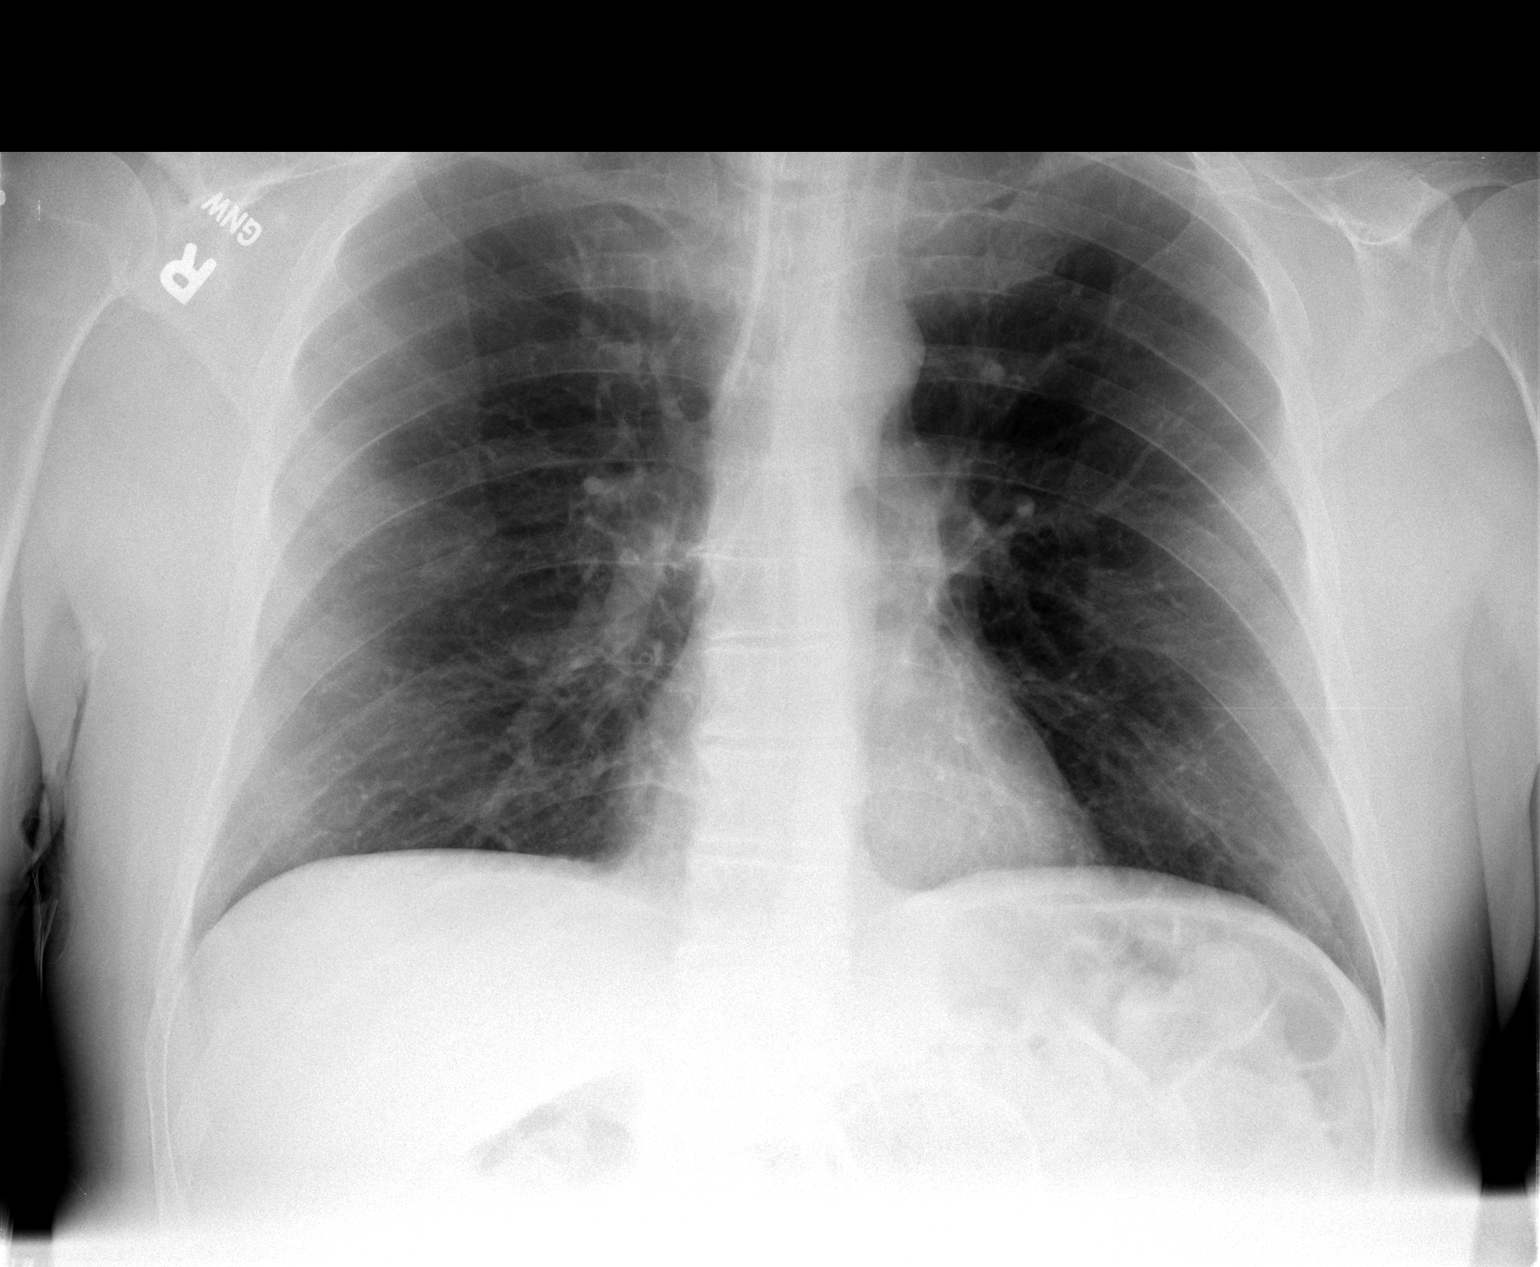

[1 of 1 positions shown; findings below may reference images not displayed]

FINDINGS: Heart and mediastinal contours are within normal limits.
No focal opacities or effusions.  No acute bony abnormality.
IMPRESSION: No active cardiopulmonary disease.

## 2012-07-04 DIAGNOSIS — F200 Paranoid schizophrenia: Secondary | ICD-10-CM | POA: Diagnosis not present

## 2012-07-05 NOTE — Progress Notes (Signed)
Patient canceled.  Reschedule prn.

## 2012-08-07 ENCOUNTER — Telehealth: Payer: Self-pay | Admitting: Oncology

## 2012-08-07 NOTE — Telephone Encounter (Signed)
PT CAREGIVER CALLED TO R/S NP APPT TO 5/16 @ 10:30. NEW CALENDAR MAILED.

## 2012-08-14 DIAGNOSIS — J449 Chronic obstructive pulmonary disease, unspecified: Secondary | ICD-10-CM | POA: Diagnosis not present

## 2012-08-14 DIAGNOSIS — D509 Iron deficiency anemia, unspecified: Secondary | ICD-10-CM | POA: Diagnosis not present

## 2012-08-14 DIAGNOSIS — F411 Generalized anxiety disorder: Secondary | ICD-10-CM | POA: Diagnosis not present

## 2012-08-14 DIAGNOSIS — I739 Peripheral vascular disease, unspecified: Secondary | ICD-10-CM | POA: Diagnosis not present

## 2012-08-14 DIAGNOSIS — I119 Hypertensive heart disease without heart failure: Secondary | ICD-10-CM | POA: Diagnosis not present

## 2012-08-14 DIAGNOSIS — F17200 Nicotine dependence, unspecified, uncomplicated: Secondary | ICD-10-CM | POA: Diagnosis not present

## 2012-08-14 DIAGNOSIS — E559 Vitamin D deficiency, unspecified: Secondary | ICD-10-CM | POA: Diagnosis not present

## 2012-08-31 ENCOUNTER — Ambulatory Visit: Payer: Medicare Other | Admitting: Oncology

## 2012-08-31 ENCOUNTER — Ambulatory Visit: Payer: Medicare Other

## 2012-08-31 ENCOUNTER — Other Ambulatory Visit: Payer: Medicare Other | Admitting: Lab

## 2012-08-31 NOTE — Progress Notes (Signed)
[facility].  Reminder letter sent.  I will ask new patient referral to contact the referring service.

## 2012-09-03 DIAGNOSIS — F200 Paranoid schizophrenia: Secondary | ICD-10-CM | POA: Diagnosis not present

## 2012-09-05 DIAGNOSIS — F200 Paranoid schizophrenia: Secondary | ICD-10-CM | POA: Diagnosis not present

## 2012-10-03 DIAGNOSIS — F200 Paranoid schizophrenia: Secondary | ICD-10-CM | POA: Diagnosis not present

## 2012-10-09 DIAGNOSIS — F17200 Nicotine dependence, unspecified, uncomplicated: Secondary | ICD-10-CM | POA: Diagnosis not present

## 2012-10-09 DIAGNOSIS — E559 Vitamin D deficiency, unspecified: Secondary | ICD-10-CM | POA: Diagnosis not present

## 2012-10-09 DIAGNOSIS — I10 Essential (primary) hypertension: Secondary | ICD-10-CM | POA: Diagnosis not present

## 2012-10-09 DIAGNOSIS — I739 Peripheral vascular disease, unspecified: Secondary | ICD-10-CM | POA: Diagnosis not present

## 2012-10-09 DIAGNOSIS — D509 Iron deficiency anemia, unspecified: Secondary | ICD-10-CM | POA: Diagnosis not present

## 2012-10-23 DIAGNOSIS — J449 Chronic obstructive pulmonary disease, unspecified: Secondary | ICD-10-CM | POA: Diagnosis not present

## 2012-10-23 DIAGNOSIS — I739 Peripheral vascular disease, unspecified: Secondary | ICD-10-CM | POA: Diagnosis not present

## 2012-10-23 DIAGNOSIS — I119 Hypertensive heart disease without heart failure: Secondary | ICD-10-CM | POA: Diagnosis not present

## 2012-10-23 DIAGNOSIS — Q828 Other specified congenital malformations of skin: Secondary | ICD-10-CM | POA: Diagnosis not present

## 2012-10-23 DIAGNOSIS — F17200 Nicotine dependence, unspecified, uncomplicated: Secondary | ICD-10-CM | POA: Diagnosis not present

## 2012-10-23 DIAGNOSIS — K3189 Other diseases of stomach and duodenum: Secondary | ICD-10-CM | POA: Diagnosis not present

## 2012-10-23 DIAGNOSIS — F411 Generalized anxiety disorder: Secondary | ICD-10-CM | POA: Diagnosis not present

## 2012-10-23 DIAGNOSIS — E559 Vitamin D deficiency, unspecified: Secondary | ICD-10-CM | POA: Diagnosis not present

## 2012-10-31 DIAGNOSIS — F200 Paranoid schizophrenia: Secondary | ICD-10-CM | POA: Diagnosis not present

## 2012-11-12 DIAGNOSIS — L723 Sebaceous cyst: Secondary | ICD-10-CM | POA: Diagnosis not present

## 2012-11-12 DIAGNOSIS — I781 Nevus, non-neoplastic: Secondary | ICD-10-CM | POA: Diagnosis not present

## 2012-12-05 DIAGNOSIS — F200 Paranoid schizophrenia: Secondary | ICD-10-CM | POA: Diagnosis not present

## 2012-12-11 DIAGNOSIS — F209 Schizophrenia, unspecified: Secondary | ICD-10-CM | POA: Diagnosis not present

## 2012-12-11 DIAGNOSIS — F200 Paranoid schizophrenia: Secondary | ICD-10-CM | POA: Diagnosis not present

## 2013-01-15 DIAGNOSIS — F209 Schizophrenia, unspecified: Secondary | ICD-10-CM | POA: Diagnosis not present

## 2013-01-16 DIAGNOSIS — I119 Hypertensive heart disease without heart failure: Secondary | ICD-10-CM | POA: Diagnosis not present

## 2013-01-16 DIAGNOSIS — R21 Rash and other nonspecific skin eruption: Secondary | ICD-10-CM | POA: Diagnosis not present

## 2013-01-16 DIAGNOSIS — J449 Chronic obstructive pulmonary disease, unspecified: Secondary | ICD-10-CM | POA: Diagnosis not present

## 2013-01-16 DIAGNOSIS — J4489 Other specified chronic obstructive pulmonary disease: Secondary | ICD-10-CM | POA: Diagnosis not present

## 2013-01-16 DIAGNOSIS — F17200 Nicotine dependence, unspecified, uncomplicated: Secondary | ICD-10-CM | POA: Diagnosis not present

## 2013-01-16 DIAGNOSIS — F259 Schizoaffective disorder, unspecified: Secondary | ICD-10-CM | POA: Diagnosis not present

## 2013-01-16 DIAGNOSIS — Z23 Encounter for immunization: Secondary | ICD-10-CM | POA: Diagnosis not present

## 2013-01-16 DIAGNOSIS — F411 Generalized anxiety disorder: Secondary | ICD-10-CM | POA: Diagnosis not present

## 2013-01-16 DIAGNOSIS — L299 Pruritus, unspecified: Secondary | ICD-10-CM | POA: Diagnosis not present

## 2013-02-12 DIAGNOSIS — F209 Schizophrenia, unspecified: Secondary | ICD-10-CM | POA: Diagnosis not present

## 2013-02-12 DIAGNOSIS — R0609 Other forms of dyspnea: Secondary | ICD-10-CM | POA: Diagnosis not present

## 2013-02-12 DIAGNOSIS — I10 Essential (primary) hypertension: Secondary | ICD-10-CM | POA: Diagnosis not present

## 2013-02-12 DIAGNOSIS — E559 Vitamin D deficiency, unspecified: Secondary | ICD-10-CM | POA: Diagnosis not present

## 2013-02-12 DIAGNOSIS — K122 Cellulitis and abscess of mouth: Secondary | ICD-10-CM | POA: Diagnosis not present

## 2013-02-12 DIAGNOSIS — I739 Peripheral vascular disease, unspecified: Secondary | ICD-10-CM | POA: Diagnosis not present

## 2013-02-12 DIAGNOSIS — F200 Paranoid schizophrenia: Secondary | ICD-10-CM | POA: Diagnosis not present

## 2013-02-12 DIAGNOSIS — G479 Sleep disorder, unspecified: Secondary | ICD-10-CM | POA: Diagnosis not present

## 2013-02-12 DIAGNOSIS — J449 Chronic obstructive pulmonary disease, unspecified: Secondary | ICD-10-CM | POA: Diagnosis not present

## 2013-02-12 DIAGNOSIS — F411 Generalized anxiety disorder: Secondary | ICD-10-CM | POA: Diagnosis not present

## 2013-02-12 DIAGNOSIS — I119 Hypertensive heart disease without heart failure: Secondary | ICD-10-CM | POA: Diagnosis not present

## 2013-03-12 DIAGNOSIS — F200 Paranoid schizophrenia: Secondary | ICD-10-CM | POA: Diagnosis not present

## 2013-03-12 DIAGNOSIS — F259 Schizoaffective disorder, unspecified: Secondary | ICD-10-CM | POA: Diagnosis not present

## 2013-03-12 DIAGNOSIS — I119 Hypertensive heart disease without heart failure: Secondary | ICD-10-CM | POA: Diagnosis not present

## 2013-03-12 DIAGNOSIS — F411 Generalized anxiety disorder: Secondary | ICD-10-CM | POA: Diagnosis not present

## 2013-03-12 DIAGNOSIS — K219 Gastro-esophageal reflux disease without esophagitis: Secondary | ICD-10-CM | POA: Diagnosis not present

## 2013-03-12 DIAGNOSIS — I739 Peripheral vascular disease, unspecified: Secondary | ICD-10-CM | POA: Diagnosis not present

## 2013-03-12 DIAGNOSIS — J449 Chronic obstructive pulmonary disease, unspecified: Secondary | ICD-10-CM | POA: Diagnosis not present

## 2013-03-12 DIAGNOSIS — F17200 Nicotine dependence, unspecified, uncomplicated: Secondary | ICD-10-CM | POA: Diagnosis not present

## 2013-03-12 DIAGNOSIS — E559 Vitamin D deficiency, unspecified: Secondary | ICD-10-CM | POA: Diagnosis not present

## 2013-03-12 DIAGNOSIS — F209 Schizophrenia, unspecified: Secondary | ICD-10-CM | POA: Diagnosis not present

## 2013-04-17 DIAGNOSIS — F200 Paranoid schizophrenia: Secondary | ICD-10-CM | POA: Diagnosis not present

## 2013-04-17 DIAGNOSIS — F209 Schizophrenia, unspecified: Secondary | ICD-10-CM | POA: Diagnosis not present

## 2013-04-26 DIAGNOSIS — F209 Schizophrenia, unspecified: Secondary | ICD-10-CM | POA: Diagnosis not present

## 2013-05-12 ENCOUNTER — Emergency Department (HOSPITAL_COMMUNITY)
Admission: EM | Admit: 2013-05-12 | Discharge: 2013-05-12 | Disposition: A | Discharge status: Home / Self Care | Payer: Medicare Other | Attending: Emergency Medicine | Admitting: Emergency Medicine

## 2013-05-12 ENCOUNTER — Encounter (HOSPITAL_COMMUNITY): Payer: Self-pay | Admitting: Emergency Medicine

## 2013-05-12 DIAGNOSIS — J449 Chronic obstructive pulmonary disease, unspecified: Secondary | ICD-10-CM | POA: Insufficient documentation

## 2013-05-12 DIAGNOSIS — F259 Schizoaffective disorder, unspecified: Secondary | ICD-10-CM | POA: Insufficient documentation

## 2013-05-12 DIAGNOSIS — K219 Gastro-esophageal reflux disease without esophagitis: Secondary | ICD-10-CM | POA: Diagnosis not present

## 2013-05-12 DIAGNOSIS — IMO0002 Reserved for concepts with insufficient information to code with codable children: Secondary | ICD-10-CM | POA: Diagnosis not present

## 2013-05-12 DIAGNOSIS — Z7982 Long term (current) use of aspirin: Secondary | ICD-10-CM | POA: Diagnosis not present

## 2013-05-12 DIAGNOSIS — F319 Bipolar disorder, unspecified: Secondary | ICD-10-CM | POA: Insufficient documentation

## 2013-05-12 DIAGNOSIS — J4489 Other specified chronic obstructive pulmonary disease: Secondary | ICD-10-CM | POA: Insufficient documentation

## 2013-05-12 DIAGNOSIS — J45909 Unspecified asthma, uncomplicated: Secondary | ICD-10-CM | POA: Diagnosis not present

## 2013-05-12 DIAGNOSIS — R300 Dysuria: Secondary | ICD-10-CM | POA: Insufficient documentation

## 2013-05-12 DIAGNOSIS — Z79899 Other long term (current) drug therapy: Secondary | ICD-10-CM | POA: Insufficient documentation

## 2013-05-12 DIAGNOSIS — Z791 Long term (current) use of non-steroidal anti-inflammatories (NSAID): Secondary | ICD-10-CM | POA: Insufficient documentation

## 2013-05-12 LAB — URINALYSIS, ROUTINE W REFLEX MICROSCOPIC
Bilirubin Urine: NEGATIVE
Glucose, UA: NEGATIVE mg/dL
Hgb urine dipstick: NEGATIVE
Ketones, ur: NEGATIVE mg/dL
Leukocytes, UA: NEGATIVE
NITRITE: NEGATIVE
PROTEIN: NEGATIVE mg/dL
SPECIFIC GRAVITY, URINE: 1.005 (ref 1.005–1.030)
UROBILINOGEN UA: 0.2 mg/dL (ref 0.0–1.0)
pH: 6.5 (ref 5.0–8.0)

## 2013-05-12 NOTE — ED Provider Notes (Signed)
Medical screening examination/treatment/procedure(s) were performed by non-physician practitioner and as supervising physician I was immediately available for consultation/collaboration.  EKG Interpretation   None         Blanchard Kelch, MD 05/12/13 2257

## 2013-05-12 NOTE — Discharge Instructions (Signed)
Your urine test did not show any concerning findings to explain your symptoms. Please followup with a primary care provider for continued evaluation and treatment.    Dysuria Dysuria is the medical term for pain with urination. There are many causes for dysuria, but urinary tract infection is the most common. If a urinalysis was performed it can show that there is a urinary tract infection. A urine culture confirms that you or your child is sick. You will need to follow up with a healthcare provider because:  If a urine culture was done you will need to know the culture results and treatment recommendations.  If the urine culture was positive, you or your child will need to be put on antibiotics or know if the antibiotics prescribed are the right antibiotics for your urinary tract infection.  If the urine culture is negative (no urinary tract infection), then other causes may need to be explored or antibiotics need to be stopped. Today laboratory work may have been done and there does not seem to be an infection. If cultures were done they will take at least 24 to 48 hours to be completed. Today x-rays may have been taken and they read as normal. No cause can be found for the problems. The x-rays may be re-read by a radiologist and you will be contacted if additional findings are made. You or your child may have been put on medications to help with this problem until you can see your primary caregiver. If the problems get better, see your primary caregiver if the problems return. If you were given antibiotics (medications which kill germs), take all of the mediations as directed for the full course of treatment.  If laboratory work was done, you need to find the results. Leave a telephone number where you can be reached. If this is not possible, make sure you find out how you are to get test results. HOME CARE INSTRUCTIONS   Drink lots of fluids. For adults, drink eight, 8 ounce glasses of clear  juice or water a day. For children, replace fluids as suggested by your caregiver.  Empty the bladder often. Avoid holding urine for long periods of time.  After a bowel movement, women should cleanse front to back, using each tissue only once.  Empty your bladder before and after sexual intercourse.  Take all the medicine given to you until it is gone. You may feel better in a few days, but TAKE ALL MEDICINE.  Avoid caffeine, tea, alcohol and carbonated beverages, because they tend to irritate the bladder.  In men, alcohol may irritate the prostate.  Only take over-the-counter or prescription medicines for pain, discomfort, or fever as directed by your caregiver.  If your caregiver has given you a follow-up appointment, it is very important to keep that appointment. Not keeping the appointment could result in a chronic or permanent injury, pain, and disability. If there is any problem keeping the appointment, you must call back to this facility for assistance. SEEK IMMEDIATE MEDICAL CARE IF:   Back pain develops.  A fever develops.  There is nausea (feeling sick to your stomach) or vomiting (throwing up).  Problems are no better with medications or are getting worse. MAKE SURE YOU:   Understand these instructions.  Will watch your condition.  Will get help right away if you are not doing well or get worse. Document Released: 01/01/2004 Document Revised: 06/27/2011 Document Reviewed: 11/08/2007 [facility] Patient Information 2014 [place].

## 2013-05-12 NOTE — ED Notes (Signed)
Per EMS: Pt used the bathroom earlier and had pain with urination.  Then he called EMS.  No fever, no cloudy or foul smelling urine, just painful urination.

## 2013-05-12 NOTE — ED Notes (Signed)
Bladder scan 200ML

## 2013-05-12 NOTE — ED Provider Notes (Signed)
CSN: [id number]     Arrival date & time 05/12/13  1907 History  This chart was scribed for non-physician practitioner Hazel Sams, working with Blanchard Kelch, MD by Donato Schultz, ED Scribe. This patient was seen in room [place] and the patient's care was started at 8:36 PM.    Chief Complaint  Patient presents with  . Dysuria    Patient is a 48 y.o. male presenting with dysuria. The history is provided by the patient. No language interpreter was used.  Dysuria Pertinent negatives include no abdominal pain.   HPI Comments: Lee House is a 48 y.o. male who presents to the Emergency Department complaining of episode of dysuria that started tonight while the patient was using the restroom.  He denies hematuria as an associated symptom.  No associated diarrhea or constipation. No rash or swelling Does report having some similar symptoms on occasion in the past.  The patient denies having a history of abdominal surgery. No other aggravating or alleviating factors. No other associated symptoms.   Past Medical History  Diagnosis Date  . Bipolar 1 disorder   . Schizo affective schizophrenia   . Asthma   . COPD (chronic obstructive pulmonary disease)   . GERD (gastroesophageal reflux disease)    Past Surgical History  Procedure Laterality Date  . Open reduction internal fixation (orif) distal radial fracture  02/27/2012    Procedure: OPEN REDUCTION INTERNAL FIXATION (ORIF) DISTAL RADIAL FRACTURE;  Surgeon: Schuyler Amor, MD;  Location: [facility];  Service: Orthopedics;  Laterality: Left;  Open reduction internal fixation of  left distal radius fracture.   . Carpal tunnel release  02/27/2012    Procedure: CARPAL TUNNEL RELEASE;  Surgeon: Schuyler Amor, MD;  Location: [facility];  Service: Orthopedics;  Laterality: Left;   No family history on file. History  Substance Use Topics  . Smoking status: Former Research scientist (life sciences)  . Smokeless tobacco: Not on file  . Alcohol Use: No     Review of Systems  Gastrointestinal: Negative for nausea, vomiting, abdominal pain, diarrhea and constipation.  Genitourinary: Positive for dysuria. Negative for frequency, hematuria, flank pain, discharge, penile swelling, scrotal swelling, genital sores and testicular pain.  Musculoskeletal: Negative for back pain.  Skin: Negative for rash.  All other systems reviewed and are negative.    Allergies  Review of patient's allergies indicates no known allergies.  Home Medications   Current Outpatient Rx  Name  Route  Sig  Dispense  Refill  . albuterol (PROVENTIL HFA;VENTOLIN HFA) 108 (90 BASE) MCG/ACT inhaler   Inhalation   Inhale 2 puffs into the lungs every 4 (four) hours as needed. As needed for shortness of breath.         Marland Kitchen aspirin EC 81 MG tablet   Oral   Take 81 mg by mouth daily.         . benztropine (COGENTIN) 1 MG tablet   Oral   Take 1 mg by mouth 2 (two) times daily.         . clonazePAM (KLONOPIN) 1 MG tablet   Oral   Take 1 mg by mouth 2 (two) times daily.         Marland Kitchen esomeprazole (NEXIUM) 40 MG capsule   Oral   Take 40 mg by mouth daily before breakfast.         . Fluticasone-Salmeterol (ADVAIR) 250-50 MCG/DOSE AEPB   Inhalation   Inhale 1 puff into the lungs every 12 (twelve) hours.         Marland Kitchen  ketoconazole (NIZORAL) 2 % cream   Topical   Apply 1 application topically daily as needed. For skin irration and contact dermatitis         . meloxicam (MOBIC) 7.5 MG tablet   Oral   Take 7.5 mg by mouth daily.         Marland Kitchen OLANZapine (ZYPREXA) 10 MG tablet   Oral   Take 10 mg by mouth 2 (two) times daily.          . Paliperidone Palmitate (INVEGA SUSTENNA) 156 MG/ML SUSP   Intramuscular   Inject 156 mg into the muscle every 30 (thirty) days.         . risperidone (RISPERDAL) 4 MG tablet   Oral   Take 4 mg by mouth 2 (two) times daily.         . traZODone (DESYREL) 50 MG tablet   Oral   Take 50 mg by mouth at bedtime.           Triage Vitals: BP 118/77  Pulse 85  Temp(Src) 97.6 F (36.4 C) (Oral)  Resp 16  Wt 179 lb 9.6 oz (81.466 kg)  SpO2 97%  Physical Exam  Nursing note and vitals reviewed. Constitutional: He is oriented to person, place, and time. He appears well-developed and well-nourished. No distress.  HENT:  Head: Normocephalic and atraumatic.  Eyes: EOM are normal.  Neck: Neck supple. No tracheal deviation present. No thyromegaly present.  Cardiovascular: Normal rate.   Pulmonary/Chest: Effort normal. No respiratory distress.  Abdominal: Soft. He exhibits no distension. There is no tenderness. There is no rebound and no guarding. Hernia confirmed negative in the right inguinal area and confirmed negative in the left inguinal area.  Genitourinary: Testes normal and penis normal. Right testis shows no mass, no swelling and no tenderness. Left testis shows no mass, no swelling and no tenderness. No discharge found.  Musculoskeletal: Normal range of motion.  Lymphadenopathy:    He has no cervical adenopathy.       Right: No inguinal adenopathy present.       Left: No inguinal adenopathy present.  Neurological: He is alert and oriented to person, place, and time.  Skin: Skin is warm and dry.  Psychiatric: He has a normal mood and affect. His behavior is normal.    ED Course  Procedures   DIAGNOSTIC STUDIES: Oxygen Saturation is 97% on room air, adequate by my interpretation.    COORDINATION OF CARE: 8:39 PM- Discussed checking the patient's UA results and performing an ultrasound of the patient's bladder.  The patient agreed to the treatment plan.   Results for orders placed during the hospital encounter of 05/12/13  URINALYSIS, ROUTINE W REFLEX MICROSCOPIC      Result Value Range   Color, Urine YELLOW  YELLOW   APPearance CLEAR  CLEAR   Specific Gravity, Urine 1.005  1.005 - 1.030   pH 6.5  5.0 - 8.0   Glucose, UA NEGATIVE  NEGATIVE mg/dL   Hgb urine dipstick NEGATIVE  NEGATIVE    Bilirubin Urine NEGATIVE  NEGATIVE   Ketones, ur NEGATIVE  NEGATIVE mg/dL   Protein, ur NEGATIVE  NEGATIVE mg/dL   Urobilinogen, UA 0.2  0.0 - 1.0 mg/dL   Nitrite NEGATIVE  NEGATIVE   Leukocytes, UA NEGATIVE  NEGATIVE     MDM   1. Dysuria      I personally performed the services described in this documentation, which was scribed in my presence. The recorded information has been  reviewed and is accurate.   Martie Lee, PA-C 05/12/13 2144

## 2013-05-12 NOTE — ED Notes (Signed)
PT FROM [facility].  [name], Bryan Lemma [phone] WILL PICK PATIENT UP WHEN HE IS DISCHARGED.

## 2013-05-15 DIAGNOSIS — F200 Paranoid schizophrenia: Secondary | ICD-10-CM | POA: Diagnosis not present

## 2013-05-15 DIAGNOSIS — F209 Schizophrenia, unspecified: Secondary | ICD-10-CM | POA: Diagnosis not present

## 2013-06-18 DIAGNOSIS — F209 Schizophrenia, unspecified: Secondary | ICD-10-CM | POA: Diagnosis not present

## 2013-07-01 DIAGNOSIS — F259 Schizoaffective disorder, unspecified: Secondary | ICD-10-CM | POA: Diagnosis not present

## 2013-07-01 DIAGNOSIS — F17200 Nicotine dependence, unspecified, uncomplicated: Secondary | ICD-10-CM | POA: Diagnosis not present

## 2013-07-01 DIAGNOSIS — E559 Vitamin D deficiency, unspecified: Secondary | ICD-10-CM | POA: Diagnosis not present

## 2013-07-01 DIAGNOSIS — K219 Gastro-esophageal reflux disease without esophagitis: Secondary | ICD-10-CM | POA: Diagnosis not present

## 2013-07-01 DIAGNOSIS — F411 Generalized anxiety disorder: Secondary | ICD-10-CM | POA: Diagnosis not present

## 2013-07-01 DIAGNOSIS — F3289 Other specified depressive episodes: Secondary | ICD-10-CM | POA: Diagnosis not present

## 2013-07-01 DIAGNOSIS — I119 Hypertensive heart disease without heart failure: Secondary | ICD-10-CM | POA: Diagnosis not present

## 2013-07-01 DIAGNOSIS — J449 Chronic obstructive pulmonary disease, unspecified: Secondary | ICD-10-CM | POA: Diagnosis not present

## 2013-07-01 DIAGNOSIS — I739 Peripheral vascular disease, unspecified: Secondary | ICD-10-CM | POA: Diagnosis not present

## 2013-07-01 DIAGNOSIS — F329 Major depressive disorder, single episode, unspecified: Secondary | ICD-10-CM | POA: Diagnosis not present

## 2013-07-16 DIAGNOSIS — F209 Schizophrenia, unspecified: Secondary | ICD-10-CM | POA: Diagnosis not present

## 2013-07-25 DIAGNOSIS — F209 Schizophrenia, unspecified: Secondary | ICD-10-CM | POA: Diagnosis not present

## 2013-08-14 DIAGNOSIS — F209 Schizophrenia, unspecified: Secondary | ICD-10-CM | POA: Diagnosis not present

## 2013-08-27 DIAGNOSIS — J449 Chronic obstructive pulmonary disease, unspecified: Secondary | ICD-10-CM | POA: Diagnosis not present

## 2013-08-27 DIAGNOSIS — F329 Major depressive disorder, single episode, unspecified: Secondary | ICD-10-CM | POA: Diagnosis not present

## 2013-08-27 DIAGNOSIS — F3289 Other specified depressive episodes: Secondary | ICD-10-CM | POA: Diagnosis not present

## 2013-08-27 DIAGNOSIS — F259 Schizoaffective disorder, unspecified: Secondary | ICD-10-CM | POA: Diagnosis not present

## 2013-08-27 DIAGNOSIS — F17200 Nicotine dependence, unspecified, uncomplicated: Secondary | ICD-10-CM | POA: Diagnosis not present

## 2013-08-27 DIAGNOSIS — I119 Hypertensive heart disease without heart failure: Secondary | ICD-10-CM | POA: Diagnosis not present

## 2013-08-27 DIAGNOSIS — F411 Generalized anxiety disorder: Secondary | ICD-10-CM | POA: Diagnosis not present

## 2013-08-27 DIAGNOSIS — K219 Gastro-esophageal reflux disease without esophagitis: Secondary | ICD-10-CM | POA: Diagnosis not present

## 2013-08-27 DIAGNOSIS — I739 Peripheral vascular disease, unspecified: Secondary | ICD-10-CM | POA: Diagnosis not present

## 2013-10-16 DIAGNOSIS — F209 Schizophrenia, unspecified: Secondary | ICD-10-CM | POA: Diagnosis not present

## 2013-10-17 DIAGNOSIS — F209 Schizophrenia, unspecified: Secondary | ICD-10-CM | POA: Diagnosis not present

## 2013-11-13 DIAGNOSIS — F209 Schizophrenia, unspecified: Secondary | ICD-10-CM | POA: Diagnosis not present

## 2013-11-19 DIAGNOSIS — K219 Gastro-esophageal reflux disease without esophagitis: Secondary | ICD-10-CM | POA: Diagnosis not present

## 2013-11-19 DIAGNOSIS — I10 Essential (primary) hypertension: Secondary | ICD-10-CM | POA: Diagnosis not present

## 2013-11-19 DIAGNOSIS — F17200 Nicotine dependence, unspecified, uncomplicated: Secondary | ICD-10-CM | POA: Diagnosis not present

## 2013-11-19 DIAGNOSIS — H538 Other visual disturbances: Secondary | ICD-10-CM | POA: Diagnosis not present

## 2013-11-19 DIAGNOSIS — F3289 Other specified depressive episodes: Secondary | ICD-10-CM | POA: Diagnosis not present

## 2013-11-19 DIAGNOSIS — I119 Hypertensive heart disease without heart failure: Secondary | ICD-10-CM | POA: Diagnosis not present

## 2013-11-19 DIAGNOSIS — J449 Chronic obstructive pulmonary disease, unspecified: Secondary | ICD-10-CM | POA: Diagnosis not present

## 2013-11-19 DIAGNOSIS — E559 Vitamin D deficiency, unspecified: Secondary | ICD-10-CM | POA: Diagnosis not present

## 2013-11-19 DIAGNOSIS — I739 Peripheral vascular disease, unspecified: Secondary | ICD-10-CM | POA: Diagnosis not present

## 2013-11-19 DIAGNOSIS — F411 Generalized anxiety disorder: Secondary | ICD-10-CM | POA: Diagnosis not present

## 2013-11-19 DIAGNOSIS — F329 Major depressive disorder, single episode, unspecified: Secondary | ICD-10-CM | POA: Diagnosis not present

## 2013-12-10 DIAGNOSIS — F209 Schizophrenia, unspecified: Secondary | ICD-10-CM | POA: Diagnosis not present

## 2013-12-11 DIAGNOSIS — F209 Schizophrenia, unspecified: Secondary | ICD-10-CM | POA: Diagnosis not present

## 2013-12-16 DIAGNOSIS — F209 Schizophrenia, unspecified: Secondary | ICD-10-CM | POA: Diagnosis not present

## 2014-01-01 DIAGNOSIS — F209 Schizophrenia, unspecified: Secondary | ICD-10-CM | POA: Diagnosis not present

## 2014-01-23 DIAGNOSIS — J449 Chronic obstructive pulmonary disease, unspecified: Secondary | ICD-10-CM | POA: Diagnosis not present

## 2014-01-23 DIAGNOSIS — E559 Vitamin D deficiency, unspecified: Secondary | ICD-10-CM | POA: Diagnosis not present

## 2014-01-23 DIAGNOSIS — I119 Hypertensive heart disease without heart failure: Secondary | ICD-10-CM | POA: Diagnosis not present

## 2014-01-23 DIAGNOSIS — R1013 Epigastric pain: Secondary | ICD-10-CM | POA: Diagnosis not present

## 2014-01-23 DIAGNOSIS — K30 Functional dyspepsia: Secondary | ICD-10-CM | POA: Diagnosis not present

## 2014-01-23 DIAGNOSIS — Z812 Family history of tobacco abuse and dependence: Secondary | ICD-10-CM | POA: Diagnosis not present

## 2014-01-23 DIAGNOSIS — K219 Gastro-esophageal reflux disease without esophagitis: Secondary | ICD-10-CM | POA: Diagnosis not present

## 2014-01-23 DIAGNOSIS — F418 Other specified anxiety disorders: Secondary | ICD-10-CM | POA: Diagnosis not present

## 2014-01-23 DIAGNOSIS — I10 Essential (primary) hypertension: Secondary | ICD-10-CM | POA: Diagnosis not present

## 2014-01-23 DIAGNOSIS — Z23 Encounter for immunization: Secondary | ICD-10-CM | POA: Diagnosis not present

## 2014-01-23 DIAGNOSIS — Z79899 Other long term (current) drug therapy: Secondary | ICD-10-CM | POA: Diagnosis not present

## 2014-01-23 DIAGNOSIS — E119 Type 2 diabetes mellitus without complications: Secondary | ICD-10-CM | POA: Diagnosis not present

## 2014-01-23 DIAGNOSIS — I739 Peripheral vascular disease, unspecified: Secondary | ICD-10-CM | POA: Diagnosis not present

## 2014-01-29 DIAGNOSIS — F209 Schizophrenia, unspecified: Secondary | ICD-10-CM | POA: Diagnosis not present

## 2014-02-07 DIAGNOSIS — F209 Schizophrenia, unspecified: Secondary | ICD-10-CM | POA: Diagnosis not present

## 2014-03-28 DIAGNOSIS — F209 Schizophrenia, unspecified: Secondary | ICD-10-CM | POA: Diagnosis not present

## 2014-04-25 DIAGNOSIS — F418 Other specified anxiety disorders: Secondary | ICD-10-CM | POA: Diagnosis not present

## 2014-04-25 DIAGNOSIS — K219 Gastro-esophageal reflux disease without esophagitis: Secondary | ICD-10-CM | POA: Diagnosis not present

## 2014-04-25 DIAGNOSIS — F259 Schizoaffective disorder, unspecified: Secondary | ICD-10-CM | POA: Diagnosis not present

## 2014-04-25 DIAGNOSIS — I10 Essential (primary) hypertension: Secondary | ICD-10-CM | POA: Diagnosis not present

## 2014-04-25 DIAGNOSIS — I739 Peripheral vascular disease, unspecified: Secondary | ICD-10-CM | POA: Diagnosis not present

## 2014-04-25 DIAGNOSIS — J449 Chronic obstructive pulmonary disease, unspecified: Secondary | ICD-10-CM | POA: Diagnosis not present

## 2014-04-25 DIAGNOSIS — I119 Hypertensive heart disease without heart failure: Secondary | ICD-10-CM | POA: Diagnosis not present

## 2014-04-25 DIAGNOSIS — E559 Vitamin D deficiency, unspecified: Secondary | ICD-10-CM | POA: Diagnosis not present

## 2014-06-04 DIAGNOSIS — F209 Schizophrenia, unspecified: Secondary | ICD-10-CM | POA: Diagnosis not present

## 2014-07-14 DIAGNOSIS — K219 Gastro-esophageal reflux disease without esophagitis: Secondary | ICD-10-CM | POA: Diagnosis not present

## 2014-07-14 DIAGNOSIS — I119 Hypertensive heart disease without heart failure: Secondary | ICD-10-CM | POA: Diagnosis not present

## 2014-07-14 DIAGNOSIS — F259 Schizoaffective disorder, unspecified: Secondary | ICD-10-CM | POA: Diagnosis not present

## 2014-07-14 DIAGNOSIS — K30 Functional dyspepsia: Secondary | ICD-10-CM | POA: Diagnosis not present

## 2014-07-14 DIAGNOSIS — F418 Other specified anxiety disorders: Secondary | ICD-10-CM | POA: Diagnosis not present

## 2014-07-14 DIAGNOSIS — J449 Chronic obstructive pulmonary disease, unspecified: Secondary | ICD-10-CM | POA: Diagnosis not present

## 2014-07-14 DIAGNOSIS — I10 Essential (primary) hypertension: Secondary | ICD-10-CM | POA: Diagnosis not present

## 2014-07-14 DIAGNOSIS — E559 Vitamin D deficiency, unspecified: Secondary | ICD-10-CM | POA: Diagnosis not present

## 2014-07-14 DIAGNOSIS — I739 Peripheral vascular disease, unspecified: Secondary | ICD-10-CM | POA: Diagnosis not present

## 2014-07-29 DIAGNOSIS — F209 Schizophrenia, unspecified: Secondary | ICD-10-CM | POA: Diagnosis not present

## 2014-08-07 DIAGNOSIS — F209 Schizophrenia, unspecified: Secondary | ICD-10-CM | POA: Diagnosis not present

## 2014-08-27 DIAGNOSIS — F209 Schizophrenia, unspecified: Secondary | ICD-10-CM | POA: Diagnosis not present

## 2014-10-09 DIAGNOSIS — F209 Schizophrenia, unspecified: Secondary | ICD-10-CM | POA: Diagnosis not present

## 2014-10-21 DIAGNOSIS — F209 Schizophrenia, unspecified: Secondary | ICD-10-CM | POA: Diagnosis not present

## 2014-10-31 DIAGNOSIS — I739 Peripheral vascular disease, unspecified: Secondary | ICD-10-CM | POA: Diagnosis not present

## 2014-10-31 DIAGNOSIS — I119 Hypertensive heart disease without heart failure: Secondary | ICD-10-CM | POA: Diagnosis not present

## 2014-10-31 DIAGNOSIS — E559 Vitamin D deficiency, unspecified: Secondary | ICD-10-CM | POA: Diagnosis not present

## 2014-10-31 DIAGNOSIS — I10 Essential (primary) hypertension: Secondary | ICD-10-CM | POA: Diagnosis not present

## 2014-10-31 DIAGNOSIS — K219 Gastro-esophageal reflux disease without esophagitis: Secondary | ICD-10-CM | POA: Diagnosis not present

## 2014-10-31 DIAGNOSIS — F259 Schizoaffective disorder, unspecified: Secondary | ICD-10-CM | POA: Diagnosis not present

## 2014-10-31 DIAGNOSIS — F418 Other specified anxiety disorders: Secondary | ICD-10-CM | POA: Diagnosis not present

## 2014-10-31 DIAGNOSIS — I959 Hypotension, unspecified: Secondary | ICD-10-CM | POA: Diagnosis not present

## 2014-10-31 DIAGNOSIS — J449 Chronic obstructive pulmonary disease, unspecified: Secondary | ICD-10-CM | POA: Diagnosis not present

## 2014-11-06 DIAGNOSIS — F209 Schizophrenia, unspecified: Secondary | ICD-10-CM | POA: Diagnosis not present

## 2014-12-16 DIAGNOSIS — I119 Hypertensive heart disease without heart failure: Secondary | ICD-10-CM | POA: Diagnosis not present

## 2014-12-16 DIAGNOSIS — K219 Gastro-esophageal reflux disease without esophagitis: Secondary | ICD-10-CM | POA: Diagnosis not present

## 2014-12-16 DIAGNOSIS — F418 Other specified anxiety disorders: Secondary | ICD-10-CM | POA: Diagnosis not present

## 2014-12-16 DIAGNOSIS — E559 Vitamin D deficiency, unspecified: Secondary | ICD-10-CM | POA: Diagnosis not present

## 2014-12-16 DIAGNOSIS — F259 Schizoaffective disorder, unspecified: Secondary | ICD-10-CM | POA: Diagnosis not present

## 2014-12-16 DIAGNOSIS — I10 Essential (primary) hypertension: Secondary | ICD-10-CM | POA: Diagnosis not present

## 2014-12-16 DIAGNOSIS — J449 Chronic obstructive pulmonary disease, unspecified: Secondary | ICD-10-CM | POA: Diagnosis not present

## 2015-01-05 DIAGNOSIS — I119 Hypertensive heart disease without heart failure: Secondary | ICD-10-CM | POA: Diagnosis not present

## 2015-01-05 DIAGNOSIS — Z720 Tobacco use: Secondary | ICD-10-CM | POA: Diagnosis not present

## 2015-01-05 DIAGNOSIS — I10 Essential (primary) hypertension: Secondary | ICD-10-CM | POA: Diagnosis not present

## 2015-01-06 DIAGNOSIS — F209 Schizophrenia, unspecified: Secondary | ICD-10-CM | POA: Diagnosis not present

## 2015-01-13 DIAGNOSIS — F209 Schizophrenia, unspecified: Secondary | ICD-10-CM | POA: Diagnosis not present

## 2015-02-05 DIAGNOSIS — F209 Schizophrenia, unspecified: Secondary | ICD-10-CM | POA: Diagnosis not present

## 2015-03-11 DIAGNOSIS — F209 Schizophrenia, unspecified: Secondary | ICD-10-CM | POA: Diagnosis not present

## 2015-04-29 DIAGNOSIS — F209 Schizophrenia, unspecified: Secondary | ICD-10-CM | POA: Diagnosis not present

## 2015-05-22 DIAGNOSIS — E162 Hypoglycemia, unspecified: Secondary | ICD-10-CM | POA: Diagnosis not present

## 2015-05-22 DIAGNOSIS — E559 Vitamin D deficiency, unspecified: Secondary | ICD-10-CM | POA: Diagnosis not present

## 2015-05-22 DIAGNOSIS — J449 Chronic obstructive pulmonary disease, unspecified: Secondary | ICD-10-CM | POA: Diagnosis not present

## 2015-05-22 DIAGNOSIS — I119 Hypertensive heart disease without heart failure: Secondary | ICD-10-CM | POA: Diagnosis not present

## 2015-05-22 DIAGNOSIS — R634 Abnormal weight loss: Secondary | ICD-10-CM | POA: Diagnosis not present

## 2015-05-22 DIAGNOSIS — Z720 Tobacco use: Secondary | ICD-10-CM | POA: Diagnosis not present

## 2015-05-22 DIAGNOSIS — I10 Essential (primary) hypertension: Secondary | ICD-10-CM | POA: Diagnosis not present

## 2015-05-22 DIAGNOSIS — F259 Schizoaffective disorder, unspecified: Secondary | ICD-10-CM | POA: Diagnosis not present

## 2015-05-22 DIAGNOSIS — K219 Gastro-esophageal reflux disease without esophagitis: Secondary | ICD-10-CM | POA: Diagnosis not present

## 2015-05-22 DIAGNOSIS — F418 Other specified anxiety disorders: Secondary | ICD-10-CM | POA: Diagnosis not present

## 2015-06-16 DIAGNOSIS — F209 Schizophrenia, unspecified: Secondary | ICD-10-CM | POA: Diagnosis not present

## 2015-07-01 DIAGNOSIS — F209 Schizophrenia, unspecified: Secondary | ICD-10-CM | POA: Diagnosis not present

## 2015-07-22 DIAGNOSIS — R634 Abnormal weight loss: Secondary | ICD-10-CM | POA: Diagnosis not present

## 2015-07-22 DIAGNOSIS — K219 Gastro-esophageal reflux disease without esophagitis: Secondary | ICD-10-CM | POA: Diagnosis not present

## 2015-07-22 DIAGNOSIS — E559 Vitamin D deficiency, unspecified: Secondary | ICD-10-CM | POA: Diagnosis not present

## 2015-07-22 DIAGNOSIS — I119 Hypertensive heart disease without heart failure: Secondary | ICD-10-CM | POA: Diagnosis not present

## 2015-07-22 DIAGNOSIS — F418 Other specified anxiety disorders: Secondary | ICD-10-CM | POA: Diagnosis not present

## 2015-07-22 DIAGNOSIS — Z720 Tobacco use: Secondary | ICD-10-CM | POA: Diagnosis not present

## 2015-07-22 DIAGNOSIS — I10 Essential (primary) hypertension: Secondary | ICD-10-CM | POA: Diagnosis not present

## 2015-07-22 DIAGNOSIS — F259 Schizoaffective disorder, unspecified: Secondary | ICD-10-CM | POA: Diagnosis not present

## 2015-07-22 DIAGNOSIS — J449 Chronic obstructive pulmonary disease, unspecified: Secondary | ICD-10-CM | POA: Diagnosis not present

## 2015-07-29 DIAGNOSIS — F209 Schizophrenia, unspecified: Secondary | ICD-10-CM | POA: Diagnosis not present

## 2015-09-30 DIAGNOSIS — F209 Schizophrenia, unspecified: Secondary | ICD-10-CM | POA: Diagnosis not present

## 2015-10-29 DIAGNOSIS — F209 Schizophrenia, unspecified: Secondary | ICD-10-CM | POA: Diagnosis not present

## 2015-11-12 DIAGNOSIS — Z5181 Encounter for therapeutic drug level monitoring: Secondary | ICD-10-CM | POA: Diagnosis not present

## 2015-11-12 DIAGNOSIS — Z131 Encounter for screening for diabetes mellitus: Secondary | ICD-10-CM | POA: Diagnosis not present

## 2015-11-12 DIAGNOSIS — Z01118 Encounter for examination of ears and hearing with other abnormal findings: Secondary | ICD-10-CM | POA: Diagnosis not present

## 2015-11-12 DIAGNOSIS — K219 Gastro-esophageal reflux disease without esophagitis: Secondary | ICD-10-CM | POA: Diagnosis not present

## 2015-11-12 DIAGNOSIS — Z0000 Encounter for general adult medical examination without abnormal findings: Secondary | ICD-10-CM | POA: Diagnosis not present

## 2015-11-12 DIAGNOSIS — I10 Essential (primary) hypertension: Secondary | ICD-10-CM | POA: Diagnosis not present

## 2015-11-12 DIAGNOSIS — I119 Hypertensive heart disease without heart failure: Secondary | ICD-10-CM | POA: Diagnosis not present

## 2015-11-12 DIAGNOSIS — E559 Vitamin D deficiency, unspecified: Secondary | ICD-10-CM | POA: Diagnosis not present

## 2015-11-12 DIAGNOSIS — Z136 Encounter for screening for cardiovascular disorders: Secondary | ICD-10-CM | POA: Diagnosis not present

## 2015-11-12 DIAGNOSIS — H538 Other visual disturbances: Secondary | ICD-10-CM | POA: Diagnosis not present

## 2015-12-09 DIAGNOSIS — F209 Schizophrenia, unspecified: Secondary | ICD-10-CM | POA: Diagnosis not present

## 2015-12-14 DIAGNOSIS — F209 Schizophrenia, unspecified: Secondary | ICD-10-CM | POA: Diagnosis not present

## 2016-01-06 DIAGNOSIS — F209 Schizophrenia, unspecified: Secondary | ICD-10-CM | POA: Diagnosis not present

## 2016-01-08 DIAGNOSIS — F209 Schizophrenia, unspecified: Secondary | ICD-10-CM | POA: Diagnosis not present

## 2016-02-03 DIAGNOSIS — K219 Gastro-esophageal reflux disease without esophagitis: Secondary | ICD-10-CM | POA: Diagnosis not present

## 2016-02-03 DIAGNOSIS — J449 Chronic obstructive pulmonary disease, unspecified: Secondary | ICD-10-CM | POA: Diagnosis not present

## 2016-02-03 DIAGNOSIS — Z720 Tobacco use: Secondary | ICD-10-CM | POA: Diagnosis not present

## 2016-02-03 DIAGNOSIS — I119 Hypertensive heart disease without heart failure: Secondary | ICD-10-CM | POA: Diagnosis not present

## 2016-02-03 DIAGNOSIS — Z23 Encounter for immunization: Secondary | ICD-10-CM | POA: Diagnosis not present

## 2016-02-03 DIAGNOSIS — E559 Vitamin D deficiency, unspecified: Secondary | ICD-10-CM | POA: Diagnosis not present

## 2016-02-03 DIAGNOSIS — I10 Essential (primary) hypertension: Secondary | ICD-10-CM | POA: Diagnosis not present

## 2016-02-10 DIAGNOSIS — F209 Schizophrenia, unspecified: Secondary | ICD-10-CM | POA: Diagnosis not present

## 2016-03-09 DIAGNOSIS — F209 Schizophrenia, unspecified: Secondary | ICD-10-CM | POA: Diagnosis not present

## 2016-04-06 DIAGNOSIS — F209 Schizophrenia, unspecified: Secondary | ICD-10-CM | POA: Diagnosis not present

## 2016-05-05 DIAGNOSIS — F209 Schizophrenia, unspecified: Secondary | ICD-10-CM | POA: Diagnosis not present

## 2016-05-11 DIAGNOSIS — K219 Gastro-esophageal reflux disease without esophagitis: Secondary | ICD-10-CM | POA: Diagnosis not present

## 2016-05-11 DIAGNOSIS — I10 Essential (primary) hypertension: Secondary | ICD-10-CM | POA: Diagnosis not present

## 2016-05-11 DIAGNOSIS — E559 Vitamin D deficiency, unspecified: Secondary | ICD-10-CM | POA: Diagnosis not present

## 2016-05-11 DIAGNOSIS — J449 Chronic obstructive pulmonary disease, unspecified: Secondary | ICD-10-CM | POA: Diagnosis not present

## 2016-05-11 DIAGNOSIS — I119 Hypertensive heart disease without heart failure: Secondary | ICD-10-CM | POA: Diagnosis not present

## 2016-05-11 DIAGNOSIS — Z720 Tobacco use: Secondary | ICD-10-CM | POA: Diagnosis not present

## 2016-06-02 DIAGNOSIS — F209 Schizophrenia, unspecified: Secondary | ICD-10-CM | POA: Diagnosis not present

## 2016-06-07 DIAGNOSIS — F209 Schizophrenia, unspecified: Secondary | ICD-10-CM | POA: Diagnosis not present

## 2016-07-05 DIAGNOSIS — F209 Schizophrenia, unspecified: Secondary | ICD-10-CM | POA: Diagnosis not present

## 2016-07-19 DIAGNOSIS — I10 Essential (primary) hypertension: Secondary | ICD-10-CM | POA: Diagnosis not present

## 2016-07-19 DIAGNOSIS — K219 Gastro-esophageal reflux disease without esophagitis: Secondary | ICD-10-CM | POA: Diagnosis not present

## 2016-07-19 DIAGNOSIS — E559 Vitamin D deficiency, unspecified: Secondary | ICD-10-CM | POA: Diagnosis not present

## 2016-07-19 DIAGNOSIS — Z720 Tobacco use: Secondary | ICD-10-CM | POA: Diagnosis not present

## 2016-07-19 DIAGNOSIS — J449 Chronic obstructive pulmonary disease, unspecified: Secondary | ICD-10-CM | POA: Diagnosis not present

## 2016-07-19 DIAGNOSIS — I119 Hypertensive heart disease without heart failure: Secondary | ICD-10-CM | POA: Diagnosis not present

## 2016-08-02 DIAGNOSIS — F209 Schizophrenia, unspecified: Secondary | ICD-10-CM | POA: Diagnosis not present

## 2016-08-30 DIAGNOSIS — F209 Schizophrenia, unspecified: Secondary | ICD-10-CM | POA: Diagnosis not present

## 2016-09-27 DIAGNOSIS — F209 Schizophrenia, unspecified: Secondary | ICD-10-CM | POA: Diagnosis not present

## 2016-11-11 DIAGNOSIS — K219 Gastro-esophageal reflux disease without esophagitis: Secondary | ICD-10-CM | POA: Diagnosis not present

## 2016-11-11 DIAGNOSIS — Z720 Tobacco use: Secondary | ICD-10-CM | POA: Diagnosis not present

## 2016-11-11 DIAGNOSIS — J449 Chronic obstructive pulmonary disease, unspecified: Secondary | ICD-10-CM | POA: Diagnosis not present

## 2016-11-11 DIAGNOSIS — Z131 Encounter for screening for diabetes mellitus: Secondary | ICD-10-CM | POA: Diagnosis not present

## 2016-11-11 DIAGNOSIS — I10 Essential (primary) hypertension: Secondary | ICD-10-CM | POA: Diagnosis not present

## 2016-11-11 DIAGNOSIS — Z0000 Encounter for general adult medical examination without abnormal findings: Secondary | ICD-10-CM | POA: Diagnosis not present

## 2016-11-11 DIAGNOSIS — E559 Vitamin D deficiency, unspecified: Secondary | ICD-10-CM | POA: Diagnosis not present

## 2016-11-11 DIAGNOSIS — I119 Hypertensive heart disease without heart failure: Secondary | ICD-10-CM | POA: Diagnosis not present

## 2016-11-11 DIAGNOSIS — Z125 Encounter for screening for malignant neoplasm of prostate: Secondary | ICD-10-CM | POA: Diagnosis not present

## 2016-12-07 DIAGNOSIS — F209 Schizophrenia, unspecified: Secondary | ICD-10-CM | POA: Diagnosis not present

## 2017-01-04 DIAGNOSIS — F209 Schizophrenia, unspecified: Secondary | ICD-10-CM | POA: Diagnosis not present

## 2017-02-01 DIAGNOSIS — F209 Schizophrenia, unspecified: Secondary | ICD-10-CM | POA: Diagnosis not present

## 2017-02-10 DIAGNOSIS — E559 Vitamin D deficiency, unspecified: Secondary | ICD-10-CM | POA: Diagnosis not present

## 2017-02-10 DIAGNOSIS — J449 Chronic obstructive pulmonary disease, unspecified: Secondary | ICD-10-CM | POA: Diagnosis not present

## 2017-02-10 DIAGNOSIS — Z23 Encounter for immunization: Secondary | ICD-10-CM | POA: Diagnosis not present

## 2017-02-10 DIAGNOSIS — K219 Gastro-esophageal reflux disease without esophagitis: Secondary | ICD-10-CM | POA: Diagnosis not present

## 2017-02-10 DIAGNOSIS — I119 Hypertensive heart disease without heart failure: Secondary | ICD-10-CM | POA: Diagnosis not present

## 2017-02-10 DIAGNOSIS — Z720 Tobacco use: Secondary | ICD-10-CM | POA: Diagnosis not present

## 2017-02-10 DIAGNOSIS — I10 Essential (primary) hypertension: Secondary | ICD-10-CM | POA: Diagnosis not present

## 2017-05-03 DIAGNOSIS — J449 Chronic obstructive pulmonary disease, unspecified: Secondary | ICD-10-CM | POA: Diagnosis not present

## 2017-05-03 DIAGNOSIS — E559 Vitamin D deficiency, unspecified: Secondary | ICD-10-CM | POA: Diagnosis not present

## 2017-05-03 DIAGNOSIS — Z131 Encounter for screening for diabetes mellitus: Secondary | ICD-10-CM | POA: Diagnosis not present

## 2017-05-03 DIAGNOSIS — Z1389 Encounter for screening for other disorder: Secondary | ICD-10-CM | POA: Diagnosis not present

## 2017-05-03 DIAGNOSIS — I10 Essential (primary) hypertension: Secondary | ICD-10-CM | POA: Diagnosis not present

## 2017-05-03 DIAGNOSIS — Z0110 Encounter for examination of ears and hearing without abnormal findings: Secondary | ICD-10-CM | POA: Diagnosis not present

## 2017-05-03 DIAGNOSIS — I119 Hypertensive heart disease without heart failure: Secondary | ICD-10-CM | POA: Diagnosis not present

## 2017-05-03 DIAGNOSIS — Z720 Tobacco use: Secondary | ICD-10-CM | POA: Diagnosis not present

## 2017-05-03 DIAGNOSIS — K219 Gastro-esophageal reflux disease without esophagitis: Secondary | ICD-10-CM | POA: Diagnosis not present

## 2017-05-16 DIAGNOSIS — F209 Schizophrenia, unspecified: Secondary | ICD-10-CM | POA: Diagnosis not present

## 2017-05-30 DIAGNOSIS — F209 Schizophrenia, unspecified: Secondary | ICD-10-CM | POA: Diagnosis not present

## 2017-07-14 DIAGNOSIS — F209 Schizophrenia, unspecified: Secondary | ICD-10-CM | POA: Diagnosis not present

## 2017-07-25 DIAGNOSIS — R21 Rash and other nonspecific skin eruption: Secondary | ICD-10-CM | POA: Diagnosis not present

## 2017-07-25 DIAGNOSIS — I10 Essential (primary) hypertension: Secondary | ICD-10-CM | POA: Diagnosis not present

## 2017-07-25 DIAGNOSIS — F209 Schizophrenia, unspecified: Secondary | ICD-10-CM | POA: Diagnosis not present

## 2017-07-25 DIAGNOSIS — E559 Vitamin D deficiency, unspecified: Secondary | ICD-10-CM | POA: Diagnosis not present

## 2017-07-25 DIAGNOSIS — I119 Hypertensive heart disease without heart failure: Secondary | ICD-10-CM | POA: Diagnosis not present

## 2017-07-25 DIAGNOSIS — J449 Chronic obstructive pulmonary disease, unspecified: Secondary | ICD-10-CM | POA: Diagnosis not present

## 2017-07-25 DIAGNOSIS — K219 Gastro-esophageal reflux disease without esophagitis: Secondary | ICD-10-CM | POA: Diagnosis not present

## 2017-07-25 DIAGNOSIS — Z720 Tobacco use: Secondary | ICD-10-CM | POA: Diagnosis not present

## 2017-08-23 DIAGNOSIS — F209 Schizophrenia, unspecified: Secondary | ICD-10-CM | POA: Diagnosis not present

## 2017-08-30 DIAGNOSIS — J449 Chronic obstructive pulmonary disease, unspecified: Secondary | ICD-10-CM | POA: Diagnosis not present

## 2017-08-30 DIAGNOSIS — I10 Essential (primary) hypertension: Secondary | ICD-10-CM | POA: Diagnosis not present

## 2017-08-30 DIAGNOSIS — Z720 Tobacco use: Secondary | ICD-10-CM | POA: Diagnosis not present

## 2017-08-30 DIAGNOSIS — F419 Anxiety disorder, unspecified: Secondary | ICD-10-CM | POA: Diagnosis not present

## 2017-08-30 DIAGNOSIS — I119 Hypertensive heart disease without heart failure: Secondary | ICD-10-CM | POA: Diagnosis not present

## 2017-08-30 DIAGNOSIS — E559 Vitamin D deficiency, unspecified: Secondary | ICD-10-CM | POA: Diagnosis not present

## 2017-08-30 DIAGNOSIS — K219 Gastro-esophageal reflux disease without esophagitis: Secondary | ICD-10-CM | POA: Diagnosis not present

## 2017-10-10 DIAGNOSIS — F209 Schizophrenia, unspecified: Secondary | ICD-10-CM | POA: Diagnosis not present

## 2017-11-07 DIAGNOSIS — F209 Schizophrenia, unspecified: Secondary | ICD-10-CM | POA: Diagnosis not present

## 2017-11-13 DIAGNOSIS — B372 Candidiasis of skin and nail: Secondary | ICD-10-CM | POA: Diagnosis not present

## 2017-11-13 DIAGNOSIS — E559 Vitamin D deficiency, unspecified: Secondary | ICD-10-CM | POA: Diagnosis not present

## 2017-11-13 DIAGNOSIS — I10 Essential (primary) hypertension: Secondary | ICD-10-CM | POA: Diagnosis not present

## 2017-11-13 DIAGNOSIS — F419 Anxiety disorder, unspecified: Secondary | ICD-10-CM | POA: Diagnosis not present

## 2017-11-13 DIAGNOSIS — Z0000 Encounter for general adult medical examination without abnormal findings: Secondary | ICD-10-CM | POA: Diagnosis not present

## 2017-11-13 DIAGNOSIS — I119 Hypertensive heart disease without heart failure: Secondary | ICD-10-CM | POA: Diagnosis not present

## 2017-11-13 DIAGNOSIS — K219 Gastro-esophageal reflux disease without esophagitis: Secondary | ICD-10-CM | POA: Diagnosis not present

## 2017-11-13 DIAGNOSIS — Z720 Tobacco use: Secondary | ICD-10-CM | POA: Diagnosis not present

## 2017-11-13 DIAGNOSIS — Z125 Encounter for screening for malignant neoplasm of prostate: Secondary | ICD-10-CM | POA: Diagnosis not present

## 2017-11-13 DIAGNOSIS — J449 Chronic obstructive pulmonary disease, unspecified: Secondary | ICD-10-CM | POA: Diagnosis not present

## 2017-12-06 DIAGNOSIS — F411 Generalized anxiety disorder: Secondary | ICD-10-CM | POA: Diagnosis not present

## 2017-12-06 DIAGNOSIS — F331 Major depressive disorder, recurrent, moderate: Secondary | ICD-10-CM | POA: Diagnosis not present

## 2017-12-06 DIAGNOSIS — F209 Schizophrenia, unspecified: Secondary | ICD-10-CM | POA: Diagnosis not present

## 2017-12-26 DIAGNOSIS — F209 Schizophrenia, unspecified: Secondary | ICD-10-CM | POA: Diagnosis not present

## 2017-12-26 DIAGNOSIS — F411 Generalized anxiety disorder: Secondary | ICD-10-CM | POA: Diagnosis not present

## 2017-12-26 DIAGNOSIS — F331 Major depressive disorder, recurrent, moderate: Secondary | ICD-10-CM | POA: Diagnosis not present

## 2018-01-03 DIAGNOSIS — F331 Major depressive disorder, recurrent, moderate: Secondary | ICD-10-CM | POA: Diagnosis not present

## 2018-01-03 DIAGNOSIS — Z13228 Encounter for screening for other metabolic disorders: Secondary | ICD-10-CM | POA: Diagnosis not present

## 2018-01-03 DIAGNOSIS — F411 Generalized anxiety disorder: Secondary | ICD-10-CM | POA: Diagnosis not present

## 2018-01-03 DIAGNOSIS — E889 Metabolic disorder, unspecified: Secondary | ICD-10-CM | POA: Diagnosis not present

## 2018-01-03 DIAGNOSIS — F209 Schizophrenia, unspecified: Secondary | ICD-10-CM | POA: Diagnosis not present

## 2018-01-04 DIAGNOSIS — H538 Other visual disturbances: Secondary | ICD-10-CM | POA: Diagnosis not present

## 2018-01-04 DIAGNOSIS — E559 Vitamin D deficiency, unspecified: Secondary | ICD-10-CM | POA: Diagnosis not present

## 2018-01-04 DIAGNOSIS — K219 Gastro-esophageal reflux disease without esophagitis: Secondary | ICD-10-CM | POA: Diagnosis not present

## 2018-01-04 DIAGNOSIS — J449 Chronic obstructive pulmonary disease, unspecified: Secondary | ICD-10-CM | POA: Diagnosis not present

## 2018-01-04 DIAGNOSIS — I10 Essential (primary) hypertension: Secondary | ICD-10-CM | POA: Diagnosis not present

## 2018-01-04 DIAGNOSIS — I119 Hypertensive heart disease without heart failure: Secondary | ICD-10-CM | POA: Diagnosis not present

## 2018-01-04 DIAGNOSIS — F419 Anxiety disorder, unspecified: Secondary | ICD-10-CM | POA: Diagnosis not present

## 2018-01-04 DIAGNOSIS — Z720 Tobacco use: Secondary | ICD-10-CM | POA: Diagnosis not present

## 2018-01-08 DIAGNOSIS — F331 Major depressive disorder, recurrent, moderate: Secondary | ICD-10-CM | POA: Diagnosis not present

## 2018-01-08 DIAGNOSIS — F209 Schizophrenia, unspecified: Secondary | ICD-10-CM | POA: Diagnosis not present

## 2018-01-08 DIAGNOSIS — F411 Generalized anxiety disorder: Secondary | ICD-10-CM | POA: Diagnosis not present

## 2018-01-30 DIAGNOSIS — F411 Generalized anxiety disorder: Secondary | ICD-10-CM | POA: Diagnosis not present

## 2018-01-30 DIAGNOSIS — F331 Major depressive disorder, recurrent, moderate: Secondary | ICD-10-CM | POA: Diagnosis not present

## 2018-01-30 DIAGNOSIS — F209 Schizophrenia, unspecified: Secondary | ICD-10-CM | POA: Diagnosis not present

## 2018-02-12 DIAGNOSIS — R52 Pain, unspecified: Secondary | ICD-10-CM | POA: Diagnosis not present

## 2018-02-12 DIAGNOSIS — I119 Hypertensive heart disease without heart failure: Secondary | ICD-10-CM | POA: Diagnosis not present

## 2018-02-12 DIAGNOSIS — E559 Vitamin D deficiency, unspecified: Secondary | ICD-10-CM | POA: Diagnosis not present

## 2018-02-12 DIAGNOSIS — Z720 Tobacco use: Secondary | ICD-10-CM | POA: Diagnosis not present

## 2018-02-12 DIAGNOSIS — K219 Gastro-esophageal reflux disease without esophagitis: Secondary | ICD-10-CM | POA: Diagnosis not present

## 2018-02-12 DIAGNOSIS — I10 Essential (primary) hypertension: Secondary | ICD-10-CM | POA: Diagnosis not present

## 2018-02-12 DIAGNOSIS — Z23 Encounter for immunization: Secondary | ICD-10-CM | POA: Diagnosis not present

## 2018-02-12 DIAGNOSIS — J449 Chronic obstructive pulmonary disease, unspecified: Secondary | ICD-10-CM | POA: Diagnosis not present

## 2018-02-12 DIAGNOSIS — F419 Anxiety disorder, unspecified: Secondary | ICD-10-CM | POA: Diagnosis not present

## 2018-06-25 DIAGNOSIS — K5900 Constipation, unspecified: Secondary | ICD-10-CM | POA: Diagnosis not present

## 2018-06-25 DIAGNOSIS — Z1211 Encounter for screening for malignant neoplasm of colon: Secondary | ICD-10-CM | POA: Diagnosis not present

## 2018-06-25 DIAGNOSIS — K219 Gastro-esophageal reflux disease without esophagitis: Secondary | ICD-10-CM | POA: Diagnosis not present

## 2018-12-27 ENCOUNTER — Other Ambulatory Visit: Payer: Self-pay

## 2018-12-27 DIAGNOSIS — Z20822 Contact with and (suspected) exposure to covid-19: Secondary | ICD-10-CM

## 2018-12-28 LAB — NOVEL CORONAVIRUS, NAA: SARS-CoV-2, NAA: NOT DETECTED

## 2019-01-14 ENCOUNTER — Other Ambulatory Visit: Payer: Self-pay

## 2019-01-14 DIAGNOSIS — Z20822 Contact with and (suspected) exposure to covid-19: Secondary | ICD-10-CM

## 2019-01-15 LAB — NOVEL CORONAVIRUS, NAA: SARS-CoV-2, NAA: NOT DETECTED

## 2019-02-06 ENCOUNTER — Other Ambulatory Visit: Payer: Self-pay

## 2019-02-06 DIAGNOSIS — Z20822 Contact with and (suspected) exposure to covid-19: Secondary | ICD-10-CM

## 2019-02-08 LAB — NOVEL CORONAVIRUS, NAA: SARS-CoV-2, NAA: NOT DETECTED

## 2019-03-08 ENCOUNTER — Other Ambulatory Visit: Payer: Self-pay

## 2019-03-08 DIAGNOSIS — Z20822 Contact with and (suspected) exposure to covid-19: Secondary | ICD-10-CM

## 2019-03-11 LAB — NOVEL CORONAVIRUS, NAA: SARS-CoV-2, NAA: NOT DETECTED

## 2019-09-09 ENCOUNTER — Inpatient Hospital Stay (HOSPITAL_COMMUNITY)
Admission: EM | Admit: 2019-09-09 | Discharge: 2019-09-13 | DRG: 698 | Disposition: A | Discharge status: Skilled Nursing Facility | Payer: 59 | Source: Skilled Nursing Facility | Attending: Internal Medicine | Admitting: Internal Medicine

## 2019-09-09 ENCOUNTER — Emergency Department (HOSPITAL_COMMUNITY): Payer: 59

## 2019-09-09 ENCOUNTER — Other Ambulatory Visit: Payer: Self-pay

## 2019-09-09 DIAGNOSIS — N319 Neuromuscular dysfunction of bladder, unspecified: Secondary | ICD-10-CM | POA: Diagnosis present

## 2019-09-09 DIAGNOSIS — D696 Thrombocytopenia, unspecified: Secondary | ICD-10-CM | POA: Diagnosis present

## 2019-09-09 DIAGNOSIS — A419 Sepsis, unspecified organism: Secondary | ICD-10-CM

## 2019-09-09 DIAGNOSIS — K219 Gastro-esophageal reflux disease without esophagitis: Secondary | ICD-10-CM | POA: Diagnosis present

## 2019-09-09 DIAGNOSIS — F319 Bipolar disorder, unspecified: Secondary | ICD-10-CM | POA: Diagnosis present

## 2019-09-09 DIAGNOSIS — S7291XD Unspecified fracture of right femur, subsequent encounter for closed fracture with routine healing: Secondary | ICD-10-CM | POA: Diagnosis not present

## 2019-09-09 DIAGNOSIS — Z87891 Personal history of nicotine dependence: Secondary | ICD-10-CM | POA: Diagnosis not present

## 2019-09-09 DIAGNOSIS — R4182 Altered mental status, unspecified: Secondary | ICD-10-CM | POA: Diagnosis present

## 2019-09-09 DIAGNOSIS — Z960 Presence of urogenital implants: Secondary | ICD-10-CM | POA: Diagnosis present

## 2019-09-09 DIAGNOSIS — Z79899 Other long term (current) drug therapy: Secondary | ICD-10-CM | POA: Diagnosis not present

## 2019-09-09 DIAGNOSIS — Y929 Unspecified place or not applicable: Secondary | ICD-10-CM | POA: Diagnosis not present

## 2019-09-09 DIAGNOSIS — F259 Schizoaffective disorder, unspecified: Secondary | ICD-10-CM | POA: Diagnosis present

## 2019-09-09 DIAGNOSIS — T83511A Infection and inflammatory reaction due to indwelling urethral catheter, initial encounter: Secondary | ICD-10-CM | POA: Diagnosis present

## 2019-09-09 DIAGNOSIS — Z7982 Long term (current) use of aspirin: Secondary | ICD-10-CM | POA: Diagnosis not present

## 2019-09-09 DIAGNOSIS — J440 Chronic obstructive pulmonary disease with acute lower respiratory infection: Secondary | ICD-10-CM | POA: Diagnosis present

## 2019-09-09 DIAGNOSIS — Y846 Urinary catheterization as the cause of abnormal reaction of the patient, or of later complication, without mention of misadventure at the time of the procedure: Secondary | ICD-10-CM | POA: Diagnosis present

## 2019-09-09 DIAGNOSIS — Z20822 Contact with and (suspected) exposure to covid-19: Secondary | ICD-10-CM | POA: Diagnosis present

## 2019-09-09 DIAGNOSIS — R4781 Slurred speech: Secondary | ICD-10-CM | POA: Diagnosis present

## 2019-09-09 DIAGNOSIS — Z880 Allergy status to penicillin: Secondary | ICD-10-CM | POA: Diagnosis not present

## 2019-09-09 DIAGNOSIS — J209 Acute bronchitis, unspecified: Secondary | ICD-10-CM | POA: Diagnosis present

## 2019-09-09 DIAGNOSIS — X58XXXD Exposure to other specified factors, subsequent encounter: Secondary | ICD-10-CM | POA: Diagnosis present

## 2019-09-09 DIAGNOSIS — R339 Retention of urine, unspecified: Secondary | ICD-10-CM

## 2019-09-09 DIAGNOSIS — N390 Urinary tract infection, site not specified: Secondary | ICD-10-CM | POA: Diagnosis present

## 2019-09-09 DIAGNOSIS — Z791 Long term (current) use of non-steroidal anti-inflammatories (NSAID): Secondary | ICD-10-CM | POA: Diagnosis not present

## 2019-09-09 DIAGNOSIS — G92 Toxic encephalopathy: Secondary | ICD-10-CM | POA: Diagnosis present

## 2019-09-09 DIAGNOSIS — F209 Schizophrenia, unspecified: Secondary | ICD-10-CM | POA: Diagnosis not present

## 2019-09-09 DIAGNOSIS — T50915A Adverse effect of multiple unspecified drugs, medicaments and biological substances, initial encounter: Secondary | ICD-10-CM | POA: Diagnosis present

## 2019-09-09 DIAGNOSIS — Z7951 Long term (current) use of inhaled steroids: Secondary | ICD-10-CM | POA: Diagnosis not present

## 2019-09-09 LAB — COMPREHENSIVE METABOLIC PANEL
ALT: 16 U/L (ref 0–44)
AST: 16 U/L (ref 15–41)
Albumin: 3.3 g/dL — ABNORMAL LOW (ref 3.5–5.0)
Alkaline Phosphatase: 139 U/L — ABNORMAL HIGH (ref 38–126)
Anion gap: 11 (ref 5–15)
BUN: 20 mg/dL (ref 6–20)
CO2: 25 mmol/L (ref 22–32)
Calcium: 8.6 mg/dL — ABNORMAL LOW (ref 8.9–10.3)
Chloride: 97 mmol/L — ABNORMAL LOW (ref 98–111)
Creatinine, Ser: 1.06 mg/dL (ref 0.61–1.24)
GFR calc Af Amer: >60 mL/min (ref >60)
GFR calc non Af Amer: >60 mL/min (ref >60)
Glucose, Bld: 111 mg/dL — ABNORMAL HIGH (ref 70–99)
Potassium: 4.1 mmol/L (ref 3.5–5.1)
Sodium: 133 mmol/L — ABNORMAL LOW (ref 135–145)
Total Bilirubin: 0.7 mg/dL (ref 0.3–1.2)
Total Protein: 6.5 g/dL (ref 6.5–8.1)

## 2019-09-09 LAB — CBC WITH DIFFERENTIAL/PLATELET
Abs Immature Granulocytes: 0.12 10*3/uL — ABNORMAL HIGH (ref 0.00–0.07)
Basophils Absolute: 0 10*3/uL (ref 0.0–0.1)
Basophils Relative: 0 %
Eosinophils Absolute: 0 10*3/uL (ref 0.0–0.5)
Eosinophils Relative: 0 %
HCT: 37.4 % — ABNORMAL LOW (ref 39.0–52.0)
Hemoglobin: 11.8 g/dL — ABNORMAL LOW (ref 13.0–17.0)
Immature Granulocytes: 1 %
Lymphocytes Relative: 4 %
Lymphs Abs: 0.8 10*3/uL (ref 0.7–4.0)
MCH: 29.3 pg (ref 26.0–34.0)
MCHC: 31.6 g/dL (ref 30.0–36.0)
MCV: 92.8 fL (ref 80.0–100.0)
Monocytes Absolute: 0.9 10*3/uL (ref 0.1–1.0)
Monocytes Relative: 5 %
Neutro Abs: 15.7 10*3/uL — ABNORMAL HIGH (ref 1.7–7.7)
Neutrophils Relative %: 90 %
Platelets: 403 10*3/uL — ABNORMAL HIGH (ref 150–400)
RBC: 4.03 MIL/uL — ABNORMAL LOW (ref 4.22–5.81)
RDW: 14.4 % (ref 11.5–15.5)
WBC: 17.6 10*3/uL — ABNORMAL HIGH (ref 4.0–10.5)
nRBC: 0 % (ref 0.0–0.2)

## 2019-09-09 LAB — RAPID URINE DRUG SCREEN, HOSP PERFORMED
Amphetamines: NOT DETECTED
Barbiturates: NOT DETECTED
Benzodiazepines: POSITIVE — AB
Cocaine: NOT DETECTED
Opiates: NOT DETECTED
Tetrahydrocannabinol: NOT DETECTED

## 2019-09-09 LAB — URINALYSIS, ROUTINE W REFLEX MICROSCOPIC
Bilirubin Urine: NEGATIVE
Glucose, UA: NEGATIVE mg/dL
Ketones, ur: NEGATIVE mg/dL
Leukocytes,Ua: NEGATIVE
Nitrite: POSITIVE — AB
Protein, ur: NEGATIVE mg/dL
RBC / HPF: >50 RBC/hpf — ABNORMAL HIGH (ref 0–5)
Specific Gravity, Urine: 1.01 (ref 1.005–1.030)
pH: 7 (ref 5.0–8.0)

## 2019-09-09 LAB — PROTIME-INR
INR: 1.2 (ref 0.8–1.2)
Prothrombin Time: 14.6 seconds (ref 11.4–15.2)

## 2019-09-09 LAB — LACTIC ACID, PLASMA: Lactic Acid, Venous: 1.2 mmol/L (ref 0.5–1.9)

## 2019-09-09 LAB — SARS CORONAVIRUS 2 BY RT PCR (HOSPITAL ORDER, PERFORMED IN ~~LOC~~ HOSPITAL LAB): SARS Coronavirus 2: NEGATIVE

## 2019-09-09 LAB — ETHANOL: Alcohol, Ethyl (B): <10 mg/dL (ref <10)

## 2019-09-09 LAB — APTT: aPTT: 32 seconds (ref 24–36)

## 2019-09-09 MED ORDER — SODIUM CHLORIDE 0.9 % IV BOLUS
1000.0000 mL | Freq: Once | INTRAVENOUS | Status: AC
  Administered 2019-09-09: 1000 mL via INTRAVENOUS

## 2019-09-09 MED ORDER — ACETAMINOPHEN 650 MG RE SUPP
650.0000 mg | Freq: Once | RECTAL | Status: AC
  Administered 2019-09-09: 650 mg via RECTAL
  Filled 2019-09-09: qty 1

## 2019-09-09 MED ORDER — SODIUM CHLORIDE 0.9 % IV SOLN
2.0000 g | Freq: Once | INTRAVENOUS | Status: AC
  Administered 2019-09-09: 2 g via INTRAVENOUS
  Filled 2019-09-09: qty 2

## 2019-09-09 MED ORDER — SODIUM CHLORIDE 0.9 % IV SOLN
2.0000 g | Freq: Two times a day (BID) | INTRAVENOUS | Status: DC
  Administered 2019-09-10 – 2019-09-11 (×3): 2 g via INTRAVENOUS
  Filled 2019-09-09 (×3): qty 2

## 2019-09-09 NOTE — ED Triage Notes (Signed)
Pt here from [facility] where he is for PT after broken femur. Pt has foley d/t neurogenic bladder and tried to pull it out today, which is normal for him-hx of schizophrenia with paranoia and thinks. After that staff drained 150 ml of blood from foley bag and pt has not had any urinary output since then. Went to check on patient at 1400 and patient lethargic, febrile, with mumbling speech. Normally A/O x 4. LSN 1130.

## 2019-09-09 NOTE — ED Provider Notes (Signed)
[facility] EMERGENCY DEPARTMENT Provider Note   CSN: [id number] Arrival date & time: 09/09/19  1546     History No chief complaint on file.   Lee House is a 54 y.o. male.  The history is provided by the patient, the EMS personnel and medical records. No language interpreter was used.   Lee House is a 54 y.o. male who presents to the Emergency Department complaining of altered mental status. He presents the emergency department by EMS from [facility] for evaluation of altered mental status. Level V caveat due to confusion. History is provided by EMS. He is currently residing at [facility] due to history of schizophrenia and recovering from a femur fracture repair. He does have a chronic indwelling Foley catheter due to history of neurogenic bladder. His baseline is ambulatory with assistance and alert and oriented times four. He was last seen in his routine state of health around 1130 this morning. This afternoon when he was checked on he was found to be lethargic with unintelligible speech. EMS reports a temperature of 100.7. Nursing facility does report about 150 ML of blood in the Foley bag and no urinary output since that time.    Past Medical History:  Diagnosis Date  . Asthma   . Bipolar 1 disorder   . COPD (chronic obstructive pulmonary disease)   . GERD (gastroesophageal reflux disease)   . Schizo affective schizophrenia     Patient Active Problem List   Diagnosis Date Noted  . AMS (altered mental status) 09/09/2019  . Acute lower UTI 09/09/2019  . Schizophrenia ([place]) 09/09/2019  . COPD with acute bronchitis ([place]) 09/09/2019  . Distal radial fracture 02/27/2012  . Anemia 06/02/2011  . Cellulitis and abscess of leg 06/01/2011  . Altered mental status 06/01/2011  . Thrombocytopenia ([place]) 06/01/2011    Past Surgical History:  Procedure Laterality Date  . CARPAL TUNNEL RELEASE  02/27/2012   Procedure: CARPAL TUNNEL RELEASE;   Surgeon: Schuyler Amor, MD;  Location: [facility];  Service: Orthopedics;  Laterality: Left;  . OPEN REDUCTION INTERNAL FIXATION (ORIF) DISTAL RADIAL FRACTURE  02/27/2012   Procedure: OPEN REDUCTION INTERNAL FIXATION (ORIF) DISTAL RADIAL FRACTURE;  Surgeon: Schuyler Amor, MD;  Location: [facility];  Service: Orthopedics;  Laterality: Left;  Open reduction internal fixation of  left distal radius fracture.        No family history on file.  Social History   Tobacco Use  . Smoking status: Former Smoker  Substance Use Topics  . Alcohol use: No  . Drug use: No    Home Medications Prior to Admission medications   Medication Sig Start Date End Date Taking? Authorizing Provider  albuterol (PROVENTIL HFA;VENTOLIN HFA) 108 (90 BASE) MCG/ACT inhaler Inhale 2 puffs into the lungs every 4 (four) hours as needed. As needed for shortness of breath.   Yes [provider]  ALPRAZolam (XANAX) 0.25 MG tablet Take 0.25 mg by mouth 2 (two) times daily as needed for anxiety.   Yes [provider]  ARIPiprazole (ABILIFY) 10 MG tablet Take 5 mg by mouth in the morning and at bedtime.   Yes [provider]  aspirin EC 325 MG tablet Take 325 mg by mouth in the morning and at bedtime. 08/23/19  Yes [provider]  benztropine (COGENTIN) 0.5 MG tablet Take 0.5 mg by mouth 2 (two) times daily.   Yes [provider]  benztropine (COGENTIN) 1 MG tablet Take 1 mg by mouth 2 (two)  times daily.   Yes [provider]  calcium-vitamin D (CALCIUM 500+D HIGH POTENCY) 500-400 MG-UNIT tablet Take 1 tablet by mouth daily.   Yes [provider]  divalproex (DEPAKOTE) 125 MG DR tablet Take 125 mg by mouth 3 (three) times daily.   Yes [provider]  enoxaparin (LOVENOX) 40 MG/0.4ML injection Inject 40 mg into the skin daily. X 14 days 08/25/19  Yes [provider]  ferrous sulfate 325 (65 FE) MG tablet Take 325 mg by mouth in the morning and at  bedtime. 08/23/19 09/22/19 Yes [provider]  Fluticasone-Salmeterol (ADVAIR) 250-50 MCG/DOSE AEPB Inhale 1 puff into the lungs every 12 (twelve) hours.   Yes [provider]  hydrOXYzine (ATARAX/VISTARIL) 25 MG tablet Take 25 mg by mouth in the morning, at noon, and at bedtime.   Yes [provider]  meloxicam (MOBIC) 7.5 MG tablet Take 7.5 mg by mouth daily.   Yes [provider]  Multiple Vitamin (MULTI-VITAMIN) tablet Take 1 tablet by mouth daily.   Yes [provider]  OLANZapine (ZYPREXA) 10 MG tablet Take 10 mg by mouth 2 (two) times daily.    Yes [provider]  olanzapine zydis (ZYPREXA) 15 MG disintegrating tablet Take 15 mg by mouth at bedtime.   Yes [provider]  oxyCODONE (OXY IR/ROXICODONE) 5 MG immediate release tablet Take 5 mg by mouth every 6 (six) hours as needed for moderate pain.   Yes [provider]  pantoprazole (PROTONIX) 40 MG tablet Take 40 mg by mouth daily. 08/24/19 09/23/19 Yes [provider]  risperidone (RISPERDAL) 4 MG tablet Take 1 mg by mouth 2 (two) times daily.    Yes [provider]  senna-docusate (SENOKOT-S) 8.6-50 MG tablet Take 1 tablet by mouth 2 (two) times daily as needed for constipation. 08/23/19  Yes [provider]  tamsulosin (FLOMAX) 0.4 MG CAPS capsule Take 0.4 mg by mouth daily. 08/24/19 09/23/19 Yes [provider]  traZODone (DESYREL) 50 MG tablet Take 50 mg by mouth at bedtime as needed for sleep.    Yes [provider]    Allergies    Penicillins  Review of Systems   Review of Systems  All other systems reviewed and are negative.   Physical Exam Updated Vital Signs BP 107/64   Pulse 90   Temp 98.1 F (36.7 C) (Oral)   Resp 16   Ht 6\' 1"  (1.854 m)   Wt 88.5 kg   SpO2 98%   BMI 25.73 kg/m   Physical Exam Vitals and nursing note reviewed.  Constitutional:      Appearance: He is well-developed. He is ill-appearing.    HENT:     Head: Normocephalic and atraumatic.  Cardiovascular:     Rate and Rhythm: Regular rhythm. Tachycardia present.     Heart sounds: No murmur.  Pulmonary:     Effort: Pulmonary effort is normal. No respiratory distress.     Breath sounds: Normal breath sounds.  Abdominal:     General: There is distension.     Palpations: Abdomen is soft.     Tenderness: There is no abdominal tenderness. There is no guarding or rebound.  Genitourinary:    Comments: Foley catheter in place, catheter bag with no urine present Musculoskeletal:        General: No tenderness.     Comments: Healing surgical wound site to the right hip  Skin:    General: Skin is warm and dry.  Neurological:  Mental Status: He is alert.     Comments: Drowsy. Awakens to verbal stimuli. Unintelligible speech. Moves all extremities weakly but symmetrically.  Psychiatric:        Behavior: Behavior normal.     ED Results / Procedures / Treatments   Labs (all labs ordered are listed, but only abnormal results are displayed) Labs Reviewed  COMPREHENSIVE METABOLIC PANEL - Abnormal; Notable for the following components:      Result Value   Sodium 133 (*)    Chloride 97 (*)    Glucose, Bld 111 (*)    Calcium 8.6 (*)    Albumin 3.3 (*)    Alkaline Phosphatase 139 (*)    All other components within normal limits  CBC WITH DIFFERENTIAL/PLATELET - Abnormal; Notable for the following components:   WBC 17.6 (*)    RBC 4.03 (*)    Hemoglobin 11.8 (*)    HCT 37.4 (*)    Platelets 403 (*)    Neutro Abs 15.7 (*)    Abs Immature Granulocytes 0.12 (*)    All other components within normal limits  URINALYSIS, ROUTINE W REFLEX MICROSCOPIC - Abnormal; Notable for the following components:   Hgb urine dipstick LARGE (*)    Nitrite POSITIVE (*)    RBC / HPF >50 (*)    Bacteria, UA MANY (*)    All other components within normal limits  RAPID URINE DRUG SCREEN, HOSP PERFORMED - Abnormal; Notable for the following  components:   Benzodiazepines POSITIVE (*)    All other components within normal limits  SARS CORONAVIRUS 2 BY RT PCR (HOSPITAL ORDER, [facility])  CULTURE, BLOOD (ROUTINE X 2)  CULTURE, BLOOD (ROUTINE X 2)  URINE CULTURE  LACTIC ACID, PLASMA  APTT  PROTIME-INR  ETHANOL    EKG EKG Interpretation  Date/Time:  Monday Sep 09 2019 16:48:53 EDT Ventricular Rate:  101 PR Interval:    QRS Duration: 113 QT Interval:  331 QTC Calculation: 429 R Axis:   50 Text Interpretation: Sinus tachycardia Borderline intraventricular conduction delay Confirmed by Quintella Reichert [phone]) on 09/09/2019 4:53:51 PM   Radiology CT Head Wo Contrast  Result Date: 09/09/2019 CLINICAL DATA:  Altered mental status. EXAM: CT HEAD WITHOUT CONTRAST TECHNIQUE: Contiguous axial images were obtained from the base of the skull through the vertex without intravenous contrast. COMPARISON:  None. FINDINGS: Brain: Mild chronic ischemic white matter disease is noted. Minimal diffuse cortical atrophy is noted. 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 No mass effect or midline shift is noted. Ventricular size is within normal limits. There is no evidence of mass lesion, hemorrhage or acute infarction. Vascular: No hyperdense vessel or unexpected calcification. Skull: Normal. Negative for fracture or focal lesion. Sinuses/Orbits: No acute finding. Other: None. IMPRESSION: Mild chronic ischemic white matter disease. Minimal diffuse cortical atrophy. No acute intracranial abnormality seen. Electronically Signed   By: Marijo Conception M.D.   On: 09/09/2019 19:31   DG Chest Port 1 View  Result Date: 09/09/2019 CLINICAL DATA:  Fever. EXAM: PORTABLE CHEST 1 VIEW COMPARISON:  June 01, 2011. FINDINGS: The heart size and mediastinal contours are within normal limits. Both lungs are clear. The visualized skeletal structures are unremarkable. IMPRESSION: No active disease.  Electronically Signed   By: Marijo Conception M.D.   On: 09/09/2019 17:08    Procedures Procedures (including critical care time) CRITICAL CARE Performed by: Quintella Reichert   Total critical care time: 32 minutes  Critical care time was exclusive of separately billable procedures and treating other patients.  Critical care was necessary to treat or prevent imminent or life-threatening deterioration.  Critical care was time spent personally by me on the following activities: development of treatment plan with patient and/or surrogate as well as nursing, discussions with consultants, evaluation of patient's response to treatment, examination of patient, obtaining history from patient or surrogate, ordering and performing treatments and interventions, ordering and review of laboratory studies, ordering and review of radiographic studies, pulse oximetry and re-evaluation of patient's condition.  Medications Ordered in ED Medications  ceFEPIme (MAXIPIME) 2 g in sodium chloride 0.9 % 100 mL IVPB (has no administration in time range)  ceFEPIme (MAXIPIME) 2 g in sodium chloride 0.9 % 100 mL IVPB (0 g Intravenous Stopped 09/09/19 1735)  sodium chloride 0.9 % bolus 1,000 mL (0 mLs Intravenous Stopped 09/09/19 1748)  acetaminophen (TYLENOL) suppository 650 mg (650 mg Rectal Given 09/09/19 1639)    ED Course  I have reviewed the triage vital signs and the nursing notes.  Pertinent labs & imaging results that were available during my care of the patient were reviewed by me and considered in my medical decision making (see chart for details).    MDM Rules/Calculators/A&P                     Patient presents from nursing facility for evaluation of altered mental status, last known well at 1130 this morning. Patient lethargic and altered on ED arrival but moving all extremities symmetrically. He is noted to be febrile in the emergency department. Bedside ultrasound demonstrates large amount of urine in  the bladder and Foley catheter bag was empty. Catheter was removed and a new catheter was placed with return of 2700 ML's of urine. He was treated with antibiotics for presumed UTI. He was treated with IV fluid hydration and antipyretic's for fever. On repeat assessment during his ED stay segmental status significantly improved. On recheck he is awake and alert and asking for breakfast. Current presentation is not consistent with meningitis. Plan to admit to the hospitalist service for further treatment.  Final Clinical Impression(s) / ED Diagnoses Final diagnoses:  Acute UTI  Urinary retention  Sepsis without acute organ dysfunction, due to unspecified organism [facility])    Rx / [place] Orders ED Discharge Orders    None       Quintella Reichert, MD 09/09/19 2019

## 2019-09-09 NOTE — H&P (Signed)
History and Physical   Lee House [id number] DOB: [birth year] DOA: 09/09/2019  Referring MD/NP/PA: Dr. Ralene Bathe  PCP: Patient, [name] Per   Outpatient Specialists: None  Patient coming from: [facility] skilled nursing facility  Chief Complaint: Altered mental status  HPI: Lee House is a 54 y.o. male with medical history significant of schizophrenia, bipolar disorder, COPD, GERD, who was brought in secondary to altered mental status.  Patient lives in [place] where he is usually able to communicate with people.  He had recent femur fracture and was in rehab there.  Patient has chronic indwelling Foley catheter as a result of neurogenic bladder.  Patient was doing better until around 1130 this morning.  Around afternoon he was found to be lethargic altered mental status and not making sense with his speech.  His temperature was around 101.  It appears he pulled his catheter out and was having a lot of bloody urine.  After 150 cc was seen in his Foley bag.  He has not had any more urine since then until arrival in the ER where his catheter was changed and he had about 2700 cc of urine.  He is stable from that at this point.  His altered mental status however this is new.  Further evaluation showed urinalysis consistent with UTI.  At this point is suspected to have had acute metabolic encephalopathy secondary to urinary tract infection..  ED Course: Temperature 102.3 blood pressure 92/77 pulse 104 respirate 26 oxygen sat 98% room air.  Urinalysis showed positive nitrite leukocyte positive WBC more than 50 RBC more than 50 many bacteria.  Urine drug screen is positive for opiates.  Head CT without contrast showed no acute findings chest x-ray showed no significant findings.  Urine culture obtained and patient is being admitted with acute metabolic encephalopathy secondary to urinary tract infection.  Review of Systems: As per HPI otherwise 10 point review of systems negative.      Past Medical History:  Diagnosis Date  . Asthma   . Bipolar 1 disorder   . COPD (chronic obstructive pulmonary disease)   . GERD (gastroesophageal reflux disease)   . Schizo affective schizophrenia     Past Surgical History:  Procedure Laterality Date  . CARPAL TUNNEL RELEASE  02/27/2012   Procedure: CARPAL TUNNEL RELEASE;  Surgeon: Schuyler Amor, MD;  Location: [place];  Service: Orthopedics;  Laterality: Left;  . OPEN REDUCTION INTERNAL FIXATION (ORIF) DISTAL RADIAL FRACTURE  02/27/2012   Procedure: OPEN REDUCTION INTERNAL FIXATION (ORIF) DISTAL RADIAL FRACTURE;  Surgeon: Schuyler Amor, MD;  Location: [facility];  Service: Orthopedics;  Laterality: Left;  Open reduction internal fixation of  left distal radius fracture.      reports that he has quit smoking. He does not have any smokeless tobacco history on file. He reports that he does not drink alcohol or use drugs.  Allergies  Allergen Reactions  . Penicillins Anaphylaxis    No family history on file.   Prior to Admission medications   Medication Sig Start Date End Date Taking? Authorizing Provider  albuterol (PROVENTIL HFA;VENTOLIN HFA) 108 (90 BASE) MCG/ACT inhaler Inhale 2 puffs into the lungs every 4 (four) hours as needed. As needed for shortness of breath.   Yes [provider]  ALPRAZolam (XANAX) 0.25 MG tablet Take 0.25 mg by mouth 2 (two) times daily as needed for anxiety.   Yes [provider]  ARIPiprazole (ABILIFY) 10 MG tablet Take 5 mg by mouth in the  morning and at bedtime.   Yes [provider]  aspirin EC 325 MG tablet Take 325 mg by mouth in the morning and at bedtime. 08/23/19  Yes [provider]  benztropine (COGENTIN) 0.5 MG tablet Take 0.5 mg by mouth 2 (two) times daily.   Yes [provider]  benztropine (COGENTIN) 1 MG tablet Take 1 mg by mouth 2 (two) times daily.   Yes [provider]  calcium-vitamin D (CALCIUM 500+D HIGH POTENCY) 500-400  MG-UNIT tablet Take 1 tablet by mouth daily.   Yes [provider]  divalproex (DEPAKOTE) 125 MG DR tablet Take 125 mg by mouth 3 (three) times daily.   Yes [provider]  enoxaparin (LOVENOX) 40 MG/0.4ML injection Inject 40 mg into the skin daily. X 14 days 08/25/19  Yes [provider]  ferrous sulfate 325 (65 FE) MG tablet Take 325 mg by mouth in the morning and at bedtime. 08/23/19 09/22/19 Yes [provider]  Fluticasone-Salmeterol (ADVAIR) 250-50 MCG/DOSE AEPB Inhale 1 puff into the lungs every 12 (twelve) hours.   Yes [provider]  hydrOXYzine (ATARAX/VISTARIL) 25 MG tablet Take 25 mg by mouth in the morning, at noon, and at bedtime.   Yes [provider]  meloxicam (MOBIC) 7.5 MG tablet Take 7.5 mg by mouth daily.   Yes [provider]  Multiple Vitamin (MULTI-VITAMIN) tablet Take 1 tablet by mouth daily.   Yes [provider]  OLANZapine (ZYPREXA) 10 MG tablet Take 10 mg by mouth 2 (two) times daily.    Yes [provider]  olanzapine zydis (ZYPREXA) 15 MG disintegrating tablet Take 15 mg by mouth at bedtime.   Yes [provider]  oxyCODONE (OXY IR/ROXICODONE) 5 MG immediate release tablet Take 5 mg by mouth every 6 (six) hours as needed for moderate pain.   Yes [provider]  pantoprazole (PROTONIX) 40 MG tablet Take 40 mg by mouth daily. 08/24/19 09/23/19 Yes [provider]  risperidone (RISPERDAL) 4 MG tablet Take 1 mg by mouth 2 (two) times daily.    Yes [provider]  senna-docusate (SENOKOT-S) 8.6-50 MG tablet Take 1 tablet by mouth 2 (two) times daily as needed for constipation. 08/23/19  Yes [provider]  tamsulosin (FLOMAX) 0.4 MG CAPS capsule Take 0.4 mg by mouth daily. 08/24/19 09/23/19 Yes [provider]  traZODone (DESYREL) 50 MG tablet Take 50 mg by mouth at bedtime as needed for sleep.    Yes [provider]    Physical  Exam: Vitals:   09/09/19 1830 09/09/19 1845 09/09/19 1845 09/09/19 1900  BP: 98/61 93/60  107/64  Pulse:      Resp: 16 13  16   Temp:   98.1 F (36.7 C)   TempSrc:   Oral   SpO2:      Weight:      Height:          Constitutional: Lethargic, arousable, very sleepy Vitals:   09/09/19 1830 09/09/19 1845 09/09/19 1845 09/09/19 1900  BP: 98/61 93/60  107/64  Pulse:      Resp: 16 13  16   Temp:   98.1 F (36.7 C)   TempSrc:   Oral   SpO2:      Weight:      Height:       Eyes: PERRL, lids and conjunctivae normal ENMT: Mucous membranes are dry. Posterior pharynx clear of any exudate or lesions.Normal dentition.  Neck: normal, supple, no masses, no thyromegaly Respiratory:  clear to auscultation bilaterally, no wheezing, no crackles. Normal respiratory effort. No accessory muscle use.  Cardiovascular: Regular rate and rhythm, no murmurs / rubs / gallops. No extremity edema. 2+ pedal pulses. No carotid bruits.  Abdomen: no tenderness, no masses palpated. No hepatosplenomegaly. Bowel sounds positive.  Musculoskeletal: no clubbing / cyanosis. No joint deformity upper and lower extremities. Good ROM, no contractures. Normal muscle tone.  Skin: no rashes, lesions, ulcers. No induration Neurologic: CN 2-12 grossly intact. Sensation intact, DTR normal. Strength 5/5 in all 4.  Psychiatric: Lethargic, not communicating.   Labs on Admission: I have personally reviewed following labs and imaging studies  CBC: Recent Labs  Lab 09/09/19 1634  WBC 17.6*  NEUTROABS 15.7*  HGB 11.8*  HCT 37.4*  MCV 92.8  PLT [id number]*   Basic Metabolic Panel: Recent Labs  Lab 09/09/19 1634  NA 133*  K 4.1  CL 97*  CO2 25  GLUCOSE 111*  BUN 20  CREATININE 1.06  CALCIUM 8.6*   GFR: Estimated Creatinine Clearance: 90 mL/min (by C-G formula based on SCr of 1.06 mg/dL). Liver Function Tests: Recent Labs  Lab 09/09/19 1634  AST 16  ALT 16  ALKPHOS 139*  BILITOT 0.7  PROT 6.5  ALBUMIN 3.3*    No results for input(s): LIPASE, AMYLASE in the last 168 hours. No results for input(s): AMMONIA in the last 168 hours. Coagulation Profile: Recent Labs  Lab 09/09/19 1634  INR 1.2   Cardiac Enzymes: No results for input(s): CKTOTAL, CKMB, CKMBINDEX, TROPONINI in the last 168 hours. BNP (last 3 results) No results for input(s): PROBNP in the last 8760 hours. HbA1C: No results for input(s): HGBA1C in the last 72 hours. CBG: No results for input(s): GLUCAP in the last 168 hours. Lipid Profile: No results for input(s): CHOL, HDL, LDLCALC, TRIG, CHOLHDL, LDLDIRECT in the last 72 hours. Thyroid Function Tests: No results for input(s): TSH, T4TOTAL, FREET4, T3FREE, THYROIDAB in the last 72 hours. Anemia Panel: No results for input(s): VITAMINB12, FOLATE, FERRITIN, TIBC, IRON, RETICCTPCT in the last 72 hours. Urine analysis:    Component Value Date/Time   COLORURINE YELLOW 09/09/2019 1701   APPEARANCEUR CLEAR 09/09/2019 1701   LABSPEC 1.010 09/09/2019 1701   PHURINE 7.0 09/09/2019 1701   GLUCOSEU NEGATIVE 09/09/2019 1701   HGBUR LARGE (A) 09/09/2019 1701   BILIRUBINUR NEGATIVE 09/09/2019 1701   KETONESUR NEGATIVE 09/09/2019 1701   PROTEINUR NEGATIVE 09/09/2019 1701   UROBILINOGEN 0.2 05/12/2013 2100   NITRITE POSITIVE (A) 09/09/2019 1701   LEUKOCYTESUR NEGATIVE 09/09/2019 1701   Sepsis Labs: @LABRCNTIP (procalcitonin:4,lacticidven:4) ) Recent Results (from the past 240 hour(s))  SARS Coronavirus 2 by RT PCR (hospital order, performed in [facility] hospital lab) Nasopharyngeal Urine, Catheterized     Status: None   Collection Time: 09/09/19  5:01 PM   Specimen: Urine, Catheterized; Nasopharyngeal  Result Value Ref Range Status   SARS Coronavirus 2 NEGATIVE NEGATIVE Final    Comment: (NOTE) SARS-CoV-2 target nucleic acids are NOT DETECTED. The SARS-CoV-2 RNA is generally detectable in upper and lower respiratory specimens during the acute phase of infection. The  lowest concentration of SARS-CoV-2 viral copies this assay can detect is 250 copies / mL. A negative result does not preclude SARS-CoV-2 infection and should not be used as the sole basis for treatment or other patient management decisions.  A negative result may occur with improper specimen collection / handling, submission of specimen other than nasopharyngeal swab, presence of viral mutation(s) within the areas targeted by this  assay, and inadequate number of viral copies (<250 copies / mL). A negative result must be combined with clinical observations, patient history, and epidemiological information. Fact Sheet for Patients:   StrictlyIdeas.no Fact Sheet for Healthcare Providers: BankingDealers.co.za This test is not yet approved or cleared  by the [place] FDA and has been authorized for detection and/or diagnosis of SARS-CoV-2 by FDA under an Emergency Use Authorization (EUA).  This EUA will remain in effect (meaning this test can be used) for the duration of the COVID-19 declaration under Section 564(b)(1) of the Act, 21 U.S.C. section 360bbb-3(b)(1), unless the authorization is terminated or revoked sooner. Performed at [facility], [place] [address], [place], [place] [address]      Radiological Exams on Admission: CT Head Wo Contrast  Result Date: 09/09/2019 CLINICAL DATA:  Altered mental status. EXAM: CT HEAD WITHOUT CONTRAST TECHNIQUE: Contiguous axial images were obtained from the base of the skull through the vertex without intravenous contrast. COMPARISON:  None. FINDINGS: Brain: Mild chronic ischemic white matter disease is noted. Minimal diffuse cortical atrophy is noted. 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 No mass effect or midline shift is noted. Ventricular size is within normal limits. There is no evidence of mass lesion, hemorrhage or acute infarction. Vascular: No hyperdense  vessel or unexpected calcification. Skull: Normal. Negative for fracture or focal lesion. Sinuses/Orbits: No acute finding. Other: None. IMPRESSION: Mild chronic ischemic white matter disease. Minimal diffuse cortical atrophy. No acute intracranial abnormality seen. Electronically Signed   By: Marijo Conception M.D.   On: 09/09/2019 19:31   DG Chest Port 1 View  Result Date: 09/09/2019 CLINICAL DATA:  Fever. EXAM: PORTABLE CHEST 1 VIEW COMPARISON:  June 01, 2011. FINDINGS: The heart size and mediastinal contours are within normal limits. Both lungs are clear. The visualized skeletal structures are unremarkable. IMPRESSION: No active disease. Electronically Signed   By: Marijo Conception M.D.   On: 09/09/2019 17:08    EKG: Independently reviewed.  It shows sinus tachycardia otherwise no significant findings.  Assessment/Plan Principal Problem:   AMS (altered mental status) Active Problems:   Acute lower UTI   Schizophrenia (HCC)   COPD with acute bronchitis ([place])     #1 altered mental status: Suspected metabolic encephalopathy secondary to UTI.  Also patient has bipolar disorder schizophrenia.  This does not seem to be due to medications from that.  He has been on this medications chronically.  At this point continued with urinalysis and urine culture.  Empiric antibiotics and supportive care.  #2 UTI: Empiric treatment.  Already set on cefepime.  Continue to follow resolution.  Also follow culture results and sensitivity  #3 COPD: No exacerbation.  Continue close monitoring  #4 schizophrenia with bipolar disorder: We will resume home regimen once confirmed.  At this point patient is stable not agitated.  #5 leukocytosis: Continue to monitor white count.   DVT prophylaxis: Lovenox Code Status: Full code Family Communication: No family at bedside Disposition Plan: Back to [facility] [place] at [employer] called: None Admission status: Inpatient  Severity of Illness: The appropriate patient  status for this patient is INPATIENT. Inpatient status is judged to be reasonable and necessary in order to provide the required intensity of service to ensure the patient's safety. The patient's presenting symptoms, physical exam findings, and initial radiographic and laboratory data in the context of their chronic comorbidities is felt to place them at high risk for further clinical deterioration. Furthermore, it is  not anticipated that the patient will be medically stable for discharge from the hospital within 2 midnights of admission. The following factors support the patient status of inpatient.   " The patient's presenting symptoms include altered mental status. " The worrisome physical exam findings include generalized confusion. " The initial radiographic and laboratory data are worrisome because of no acute findings. " The chronic co-morbidities include schizophrenia.   * I certify that at the point of admission it is my clinical judgment that the patient will require inpatient hospital care spanning beyond 2 midnights from the point of admission due to high intensity of service, high risk for further deterioration and high frequency of surveillance required.Barbette Merino MD [facility] Pager [phone]  If 7PM-7AM, please contact night-coverage [email or web address] Password [facility]  09/09/2019, 8:06 PM

## 2019-09-09 NOTE — Progress Notes (Signed)
Pharmacy Antibiotic Note  Lee House is a 54 y.o. male admitted on 09/09/2019 with UTI.  Pharmacy has been consulted for cefepime dosing. Pt is febrile with Tmax 102.3 and WBC is elevated at 17.6. SCr is WNL and lactic acid is normal.   Plan: Cefepime 2g IV Q12H F/u renal fxn, C&S, clinical status *Pharmacy will sign off as no dose adjustments are anticipated. Thank you for the consult!  Height: 6\' 1"  (185.4 cm) Weight: 88.5 kg (195 lb) IBW/kg (Calculated) : 79.9  Temp (24hrs), Avg:100.2 F (37.9 C), Min:98.1 F (36.7 C), Max:102.3 F (39.1 C)  Recent Labs  Lab 09/09/19 1634  WBC 17.6*  CREATININE 1.06  LATICACIDVEN 1.2    Estimated Creatinine Clearance: 90 mL/min (by C-G formula based on SCr of 1.06 mg/dL).    Allergies  Allergen Reactions  . Penicillins Anaphylaxis    Lee House, Lee House 09/09/2019 4:19 PM

## 2019-09-10 LAB — COMPREHENSIVE METABOLIC PANEL
ALT: 16 U/L (ref 0–44)
AST: 13 U/L — ABNORMAL LOW (ref 15–41)
Albumin: 2.4 g/dL — ABNORMAL LOW (ref 3.5–5.0)
Alkaline Phosphatase: 98 U/L (ref 38–126)
Anion gap: 9 (ref 5–15)
BUN: 16 mg/dL (ref 6–20)
CO2: 25 mmol/L (ref 22–32)
Calcium: 8 mg/dL — ABNORMAL LOW (ref 8.9–10.3)
Chloride: 101 mmol/L (ref 98–111)
Creatinine, Ser: 0.85 mg/dL (ref 0.61–1.24)
GFR calc Af Amer: >60 mL/min (ref >60)
GFR calc non Af Amer: >60 mL/min (ref >60)
Glucose, Bld: 96 mg/dL (ref 70–99)
Potassium: 3.9 mmol/L (ref 3.5–5.1)
Sodium: 135 mmol/L (ref 135–145)
Total Bilirubin: 1 mg/dL (ref 0.3–1.2)
Total Protein: 4.8 g/dL — ABNORMAL LOW (ref 6.5–8.1)

## 2019-09-10 LAB — CBC
HCT: 30 % — ABNORMAL LOW (ref 39.0–52.0)
Hemoglobin: 9.4 g/dL — ABNORMAL LOW (ref 13.0–17.0)
MCH: 28.7 pg (ref 26.0–34.0)
MCHC: 31.3 g/dL (ref 30.0–36.0)
MCV: 91.7 fL (ref 80.0–100.0)
Platelets: 276 10*3/uL (ref 150–400)
RBC: 3.27 MIL/uL — ABNORMAL LOW (ref 4.22–5.81)
RDW: 14.2 % (ref 11.5–15.5)
WBC: 8.7 10*3/uL (ref 4.0–10.5)
nRBC: 0 % (ref 0.0–0.2)

## 2019-09-10 LAB — URINE CULTURE: Culture: >=100000 — AB

## 2019-09-10 LAB — HIV ANTIBODY (ROUTINE TESTING W REFLEX): HIV Screen 4th Generation wRfx: NONREACTIVE

## 2019-09-10 MED ORDER — RISPERIDONE 1 MG PO TABS
1.0000 mg | ORAL_TABLET | Freq: Two times a day (BID) | ORAL | Status: DC
  Administered 2019-09-10 – 2019-09-13 (×7): 1 mg via ORAL
  Filled 2019-09-10 (×8): qty 1

## 2019-09-10 MED ORDER — ONDANSETRON HCL 4 MG/2ML IJ SOLN: 4.0000 mg | Freq: Four times a day (QID) | INTRAMUSCULAR | Status: DC | PRN

## 2019-09-10 MED ORDER — BENZTROPINE MESYLATE 1 MG PO TABS
1.0000 mg | ORAL_TABLET | Freq: Two times a day (BID) | ORAL | Status: DC
  Administered 2019-09-10: 1 mg via ORAL
  Filled 2019-09-10 (×2): qty 1

## 2019-09-10 MED ORDER — ACETAMINOPHEN 650 MG RE SUPP: 650.0000 mg | Freq: Four times a day (QID) | RECTAL | Status: DC | PRN

## 2019-09-10 MED ORDER — TAMSULOSIN HCL 0.4 MG PO CAPS
0.4000 mg | ORAL_CAPSULE | Freq: Every day | ORAL | Status: DC
  Administered 2019-09-10 – 2019-09-13 (×4): 0.4 mg via ORAL
  Filled 2019-09-10 (×4): qty 1

## 2019-09-10 MED ORDER — TRAZODONE HCL 50 MG PO TABS
50.0000 mg | ORAL_TABLET | Freq: Every evening | ORAL | Status: DC | PRN
  Administered 2019-09-12: 50 mg via ORAL
  Filled 2019-09-10: qty 1

## 2019-09-10 MED ORDER — SODIUM CHLORIDE 0.9 % IV SOLN: INTRAVENOUS | Status: DC

## 2019-09-10 MED ORDER — OXYCODONE HCL 5 MG PO TABS: 5.0000 mg | ORAL_TABLET | Freq: Four times a day (QID) | ORAL | Status: DC | PRN

## 2019-09-10 MED ORDER — ENOXAPARIN SODIUM 40 MG/0.4ML ~~LOC~~ SOLN
40.0000 mg | SUBCUTANEOUS | Status: DC
  Administered 2019-09-10 – 2019-09-13 (×4): 40 mg via SUBCUTANEOUS
  Filled 2019-09-10 (×4): qty 0.4

## 2019-09-10 MED ORDER — CHLORHEXIDINE GLUCONATE CLOTH 2 % EX PADS
6.0000 | MEDICATED_PAD | Freq: Every day | CUTANEOUS | Status: DC
  Administered 2019-09-10 – 2019-09-13 (×4): 6 via TOPICAL

## 2019-09-10 MED ORDER — DIVALPROEX SODIUM 125 MG PO DR TAB
125.0000 mg | DELAYED_RELEASE_TABLET | Freq: Three times a day (TID) | ORAL | Status: DC
  Administered 2019-09-10: 125 mg via ORAL
  Filled 2019-09-10 (×3): qty 1

## 2019-09-10 MED ORDER — OLANZAPINE 5 MG PO TABS
5.0000 mg | ORAL_TABLET | Freq: Two times a day (BID) | ORAL | Status: DC
  Administered 2019-09-10 – 2019-09-13 (×6): 5 mg via ORAL
  Filled 2019-09-10 (×6): qty 1

## 2019-09-10 MED ORDER — ACETAMINOPHEN 325 MG PO TABS: 650.0000 mg | ORAL_TABLET | Freq: Four times a day (QID) | ORAL | Status: DC | PRN

## 2019-09-10 MED ORDER — ONDANSETRON HCL 4 MG PO TABS: 4.0000 mg | ORAL_TABLET | Freq: Four times a day (QID) | ORAL | Status: DC | PRN

## 2019-09-10 MED ORDER — ARIPIPRAZOLE 5 MG PO TABS
10.0000 mg | ORAL_TABLET | Freq: Every day | ORAL | Status: DC
  Administered 2019-09-10 – 2019-09-13 (×4): 10 mg via ORAL
  Filled 2019-09-10 (×4): qty 2

## 2019-09-10 MED ORDER — OLANZAPINE 5 MG PO TABS
10.0000 mg | ORAL_TABLET | Freq: Two times a day (BID) | ORAL | Status: DC
  Administered 2019-09-10: 10 mg via ORAL
  Filled 2019-09-10: qty 2

## 2019-09-10 MED ORDER — BENZTROPINE MESYLATE 0.5 MG PO TABS
0.5000 mg | ORAL_TABLET | Freq: Two times a day (BID) | ORAL | Status: DC
  Administered 2019-09-10 – 2019-09-13 (×7): 0.5 mg via ORAL
  Filled 2019-09-10 (×8): qty 1

## 2019-09-10 NOTE — Progress Notes (Addendum)
PROGRESS NOTE  Lee House  [id number] DOB: 04-16-1966 DOA: 09/09/2019 PCP: Patient, [name] Per      Brief Narrative:  32 white male resident of [facility] facility chronic lower extremity cellulitis Recent right pathological intertrochanteric femur fracture status post intramedullary nailing [facility] 08/20/2019 Ortho physician [place] [facility] Neurogenic bladder with Foley Prior displaced left radial fracture 02/2012 Bipolar + schizoaffective disorder GERD COPD Chronic thrombocytopenia  Found to be lethargic, had toxic metabolic encephalopathy was febrile to 103-large amount of urine in the bag and retention with 2.7 L draining after placement of Foley  ED work-up T-max 102.3/BP 92/70 CT head negative urine culture pending---started on daily/IVF 100 cc/h  Assessment & Plan:   Principal Problem:   AMS (altered mental status) Active Problems:   Acute lower UTI   Schizophrenia (HCC)   COPD with acute bronchitis (HCC)  Toxic metabolic encepaholpathy combined form UTI vs polypharmacy 1. Follow urine culture continue antibiotics cefepime daily 2. Have discontinued Depakote cut back dose of olanzapine from 10-5 and have discontinued therapeutic duplication on the chart with 2 different doses of benztropine-if he does not wake up a little bit more as he is quite sleepy and thinks that it is 2025 and cannot tell me exactly where he is then psychiatry will need to be called tomorrow morning to clarify what medication should continue to watch should not Acute urinary retention from prior to admission Hematuria? 1. We will clamp the catheter in the morning and see if he is able to void tomorrow if he is more awake 2. Continue post void residuals and bladder scans which I would do every 8 for 6 episodes 3. Continue Flomax 0.4 daily, saline at 100 cc/H 4. Patient already has an appointment with [facility] next week Bipolar + schizophrenia 1. Patient unable to tell me  who his psychiatrist is-current medications include 2. Aripiprazole 10 daily, olanzapine down to 5 twice daily, Resporal 1 mg twice daily trazodone 50 at bedtime as needed Recent hip surgery [facility] 1. Follow hip precautions as per their instructions which are in care everywhere I will ask PT to see the patient not emergently tomorrow    DVT prophylaxis: Lovenox Code Status: Full Family Communication: Called multiple family members with no response Disposition:   Status is: Inpatient  Remains inpatient appropriate because:Persistent severe electrolyte disturbances   Dispo: The patient is from: SNF              Anticipated d/c is to: SNF              Anticipated d/c date is: 2 days              Patient currently is not medically stable to d/c.    Consultants:   n  Procedures: n  Antimicrobials: Ceftriaxone   Subjective: Very mumbled and slurred speech he seems to arouse but seems quite sleepy and is unable to orient fairly well he ate 2 plates of food today and still seems to be hungry He is somewhat disheveled  Objective: Vitals:   09/10/19 0038 09/10/19 0052 09/10/19 0420 09/10/19 0806  BP: 102/63 (!) 110/57 102/60 91/61  Pulse: 82 94 79 85  Resp: 16 16 16 15   Temp: 98.1 F (36.7 C) 98.1 F (36.7 C) 98.7 F (37.1 C) (!) 97.4 F (36.3 C)  TempSrc: Oral Oral  Oral  SpO2: 100% 97% 97% 99%  Weight:      Height:  Intake/Output Summary (Last 24 hours) at 09/10/2019 0827 Last data filed at 09/10/2019 0550 Gross per 24 hour  Intake 2210.33 ml  Output 3800 ml  Net -1589.67 ml   Filed Weights   09/09/19 1706  Weight: 88.5 kg    Examination:  General exam: Awake alert disheveled pleasant but quite sleepy Respiratory system: Clear no added sound rales rhonchi Cardiovascular system: S1-S2 no murmur rub or gallop no PMI displacement Gastrointestinal system: Soft nontender no rebound, no organomegaly, bowel sounds heard. Central nervous system: Moves  all 4 limbs equally power 5/5 grossly Extremities: No lower extremity edema Skin: Intact no rash Psychiatry: Flat affect and quite sleepy    Data Reviewed: I have personally reviewed following labs and imaging studies Creatinine down from 20/1.06-->close to baseline 16/0.8 AST ALT 13/16 WBC down from 17-8.7, hemoglobin down from 11-9.4  Radiology Studies: CT Head Wo Contrast  Result Date: 09/09/2019 CLINICAL DATA:  Altered mental status. EXAM: CT HEAD WITHOUT CONTRAST TECHNIQUE: Contiguous axial images were obtained from the base of the skull through the vertex without intravenous contrast. COMPARISON:  None. FINDINGS: Brain: Mild chronic ischemic white matter disease is noted. Minimal diffuse cortical atrophy is noted. 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 No mass effect or midline shift is noted. Ventricular size is within normal limits. There is no evidence of mass lesion, hemorrhage or acute infarction. Vascular: No hyperdense vessel or unexpected calcification. Skull: Normal. Negative for fracture or focal lesion. Sinuses/Orbits: No acute finding. Other: None. IMPRESSION: Mild chronic ischemic white matter disease. Minimal diffuse cortical atrophy. No acute intracranial abnormality seen. Electronically Signed   By: Marijo Conception M.D.   On: 09/09/2019 19:31   DG Chest Port 1 View  Result Date: 09/09/2019 CLINICAL DATA:  Fever. EXAM: PORTABLE CHEST 1 VIEW COMPARISON:  June 01, 2011. FINDINGS: The heart size and mediastinal contours are within normal limits. Both lungs are clear. The visualized skeletal structures are unremarkable. IMPRESSION: No active disease. Electronically Signed   By: Marijo Conception M.D.   On: 09/09/2019 17:08      Scheduled Meds: . ARIPiprazole  10 mg Oral Daily  . benztropine  0.5 mg Oral BID  . Chlorhexidine Gluconate Cloth  6 each Topical Daily  . enoxaparin (LOVENOX) injection  40 mg Subcutaneous Q24H  . OLANZapine  5 mg  Oral BID  . risperidone  1 mg Oral BID  . tamsulosin  0.4 mg Oral Daily   Continuous Infusions: . sodium chloride 100 mL/hr at 09/10/19 0934  . ceFEPime (MAXIPIME) IV 2 g (09/10/19 0550)     LOS: 1 day    Time spent: [place], MD [facility]   To contact the attending provider between 7A-7P or the covering provider during after hours 7P-7A, please log into the web site [email or web address] and access using universal [place] password for that web site. If you do not have the password, please call the hospital operator.  09/10/2019, 8:27 AM

## 2019-09-10 NOTE — Plan of Care (Signed)
Patient still has some blood from his penis from when foley was reinserted.     Problem: Education: Goal: Knowledge of General Education information will improve Description: Including pain rating scale, medication(s)/side effects and non-pharmacologic comfort measures Outcome: Progressing   Problem: Clinical Measurements: Goal: Will remain free from infection Outcome: Progressing   Problem: Activity: Goal: Risk for activity intolerance will decrease Outcome: Progressing   Problem: Safety: Goal: Ability to remain free from injury will improve Outcome: Progressing   Problem: Skin Integrity: Goal: Risk for impaired skin integrity will decrease Outcome: Progressing

## 2019-09-11 DIAGNOSIS — J209 Acute bronchitis, unspecified: Secondary | ICD-10-CM

## 2019-09-11 DIAGNOSIS — J440 Chronic obstructive pulmonary disease with acute lower respiratory infection: Secondary | ICD-10-CM

## 2019-09-11 LAB — CBC WITH DIFFERENTIAL/PLATELET
Abs Immature Granulocytes: 0.04 10*3/uL (ref 0.00–0.07)
Basophils Absolute: 0 10*3/uL (ref 0.0–0.1)
Basophils Relative: 1 %
Eosinophils Absolute: 0.1 10*3/uL (ref 0.0–0.5)
Eosinophils Relative: 2 %
HCT: 30.8 % — ABNORMAL LOW (ref 39.0–52.0)
Hemoglobin: 9.8 g/dL — ABNORMAL LOW (ref 13.0–17.0)
Immature Granulocytes: 1 %
Lymphocytes Relative: 19 %
Lymphs Abs: 0.9 10*3/uL (ref 0.7–4.0)
MCH: 29.4 pg (ref 26.0–34.0)
MCHC: 31.8 g/dL (ref 30.0–36.0)
MCV: 92.5 fL (ref 80.0–100.0)
Monocytes Absolute: 0.6 10*3/uL (ref 0.1–1.0)
Monocytes Relative: 13 %
Neutro Abs: 3 10*3/uL (ref 1.7–7.7)
Neutrophils Relative %: 64 %
Platelets: 293 10*3/uL (ref 150–400)
RBC: 3.33 MIL/uL — ABNORMAL LOW (ref 4.22–5.81)
RDW: 13.8 % (ref 11.5–15.5)
WBC: 4.6 10*3/uL (ref 4.0–10.5)
nRBC: 0 % (ref 0.0–0.2)

## 2019-09-11 LAB — BASIC METABOLIC PANEL
Anion gap: 6 (ref 5–15)
BUN: 15 mg/dL (ref 6–20)
CO2: 27 mmol/L (ref 22–32)
Calcium: 8.3 mg/dL — ABNORMAL LOW (ref 8.9–10.3)
Chloride: 105 mmol/L (ref 98–111)
Creatinine, Ser: 0.87 mg/dL (ref 0.61–1.24)
GFR calc Af Amer: >60 mL/min (ref >60)
GFR calc non Af Amer: >60 mL/min (ref >60)
Glucose, Bld: 96 mg/dL (ref 70–99)
Potassium: 4.4 mmol/L (ref 3.5–5.1)
Sodium: 138 mmol/L (ref 135–145)

## 2019-09-11 LAB — SARS CORONAVIRUS 2 (TAT 6-24 HRS): SARS Coronavirus 2: NEGATIVE

## 2019-09-11 MED ORDER — CEFDINIR 300 MG PO CAPS
300.0000 mg | ORAL_CAPSULE | Freq: Two times a day (BID) | ORAL | Status: DC
  Administered 2019-09-11 – 2019-09-13 (×5): 300 mg via ORAL
  Filled 2019-09-11 (×7): qty 1

## 2019-09-11 NOTE — Progress Notes (Signed)
PROGRESS NOTE    Lee House  [id number] DOB: 1965/11/09 DOA: 09/09/2019 PCP: Patient, [name] Per    Brief Narrative:  Lee House is a 54 year old Caucasian male with past medical history notable for neurogenic bladder with chronic Foley catheter, bipolar and schizoaffective disorder, COPD, recent right femur fracture status post IM nail at [facility] 5/4, who presented to the ED with confusion, fever 103.0 F likely secondary to UTI versus polypharmacy with acute urinary retention.   Assessment & Plan:   Principal Problem:   AMS (altered mental status) Active Problems:   Acute lower UTI   Schizophrenia (HCC)   COPD with acute bronchitis ([place])   Acute metabolic encephalopathy Etiology likely multifactorial with combined UTI versus polypharmacy. UDS positive for benzos. HIV nonreactive. CT head with no acute intracranial abnormalities and chest x-ray with no acute cardiopulmonary disease process. Continue treatment with antibiotics as depicted below. Discontinued Depakote and decrease olanzapine from 10 mg to 5 mg twice daily. --Mental status now seems to be back at baseline  UTI Urinalysis with negative leukoesterase, positive nitrite, many bacteria and 0-5 WBCs. Urine culture >100K DIPHTHEROIDS(CORYNEBACTERIUM SPECIES) --Transition cefepime to cefdinir 300 mg p.o. twice daily today  Acute urinary retention Hx Neurogenic bladder Patient with history of neurogenic bladder with chronic Foley catheter in place. Patient with abdominal distention, replace Foley catheter with immediate return of 2.7 L of urine. --Continue to monitor urine output --contginue tamsulosin 0.4 mg p.o. daily  Bipolar disorder Schizoaffective disorder --Olanzapine decreased from 10 mg to 5 mg p.o. twice daily --Continue Risperdal 1 mg PO BID --cogentin 0.5mg  BID --Abilify 10 mg p.o. daily --Trazodone 50 mg p.o. nightly  Recent right femur fracture Recent ORIF with IM nail at [facility]. --PT following, recommend return to SNF   DVT prophylaxis: Lovenox Code Status: Full code Family Communication: None present at bedside  Disposition Plan:  Status is: Inpatient  Remains inpatient appropriate because:Unsafe d/c plan and IV treatments appropriate due to intensity of illness or inability to take PO   Dispo: The patient is from: SNF, [facility]              Anticipated d/c is to: SNF, [facility]              Anticipated d/c date is: 1 day              Patient currently is medically stable to d/c.   Consultants:   none  Procedures:   Replaced Foley catheter  Antimicrobials:   Cefepime 5/24 - 5/26  Cefdinir 5/26>>   Subjective: Patient seen and examined bedside, resting comfortably. No complaints this morning. Mentation appears to be at his normal baseline. Denies headache, no fever/chills/night sweats, no nausea/vomiting/diarrhea, no chest pain, palpitations, no shortness of breath, no abdominal pain, no weakness. No acute events overnight per nursing staff.  Objective: Vitals:   09/10/19 2006 09/11/19 0515 09/11/19 0816 09/11/19 1349  BP: 97/62 102/64 103/70 (!) 152/91  Pulse: 86 74 87 83  Resp: 18 18 16 15   Temp: 98.7 F (37.1 C) 98.5 F (36.9 C) 98.3 F (36.8 C) 98 F (36.7 C)  TempSrc: Oral Oral Oral Oral  SpO2: 100% 100% 100%   Weight:      Height:        Intake/Output Summary (Last 24 hours) at 09/11/2019 1423 Last data filed at 09/11/2019 1100 Gross per 24 hour  Intake 720 ml  Output 3700 ml  Net -2980 ml  Filed Weights   09/09/19 1706  Weight: 88.5 kg    Examination:  General exam: Appears calm and comfortable  Respiratory system: Clear to auscultation. Respiratory effort normal. Oxygenating well on room air Cardiovascular system: S1 & S2 heard, RRR. No JVD, murmurs, rubs, gallops or clicks. No pedal edema. Gastrointestinal system: Abdomen is nondistended, soft and nontender. No organomegaly or  masses felt. Normal bowel sounds heard. Central nervous system: Alert and oriented. No focal neurological deficits. Extremities: Symmetric 5 x 5 power. Skin: No rashes, lesions or ulcers Psychiatry: Judgement and insight appear poor. Mood & affect appropriate.     Data Reviewed: I have personally reviewed following labs and imaging studies  CBC: Recent Labs  Lab 09/09/19 1634 09/10/19 0600 09/11/19 0421  WBC 17.6* 8.7 4.6  NEUTROABS 15.7*  --  3.0  HGB 11.8* 9.4* 9.8*  HCT 37.4* 30.0* 30.8*  MCV 92.8 91.7 92.5  PLT 403* 276 0000000   Basic Metabolic Panel: Recent Labs  Lab 09/09/19 1634 09/10/19 0600 09/11/19 0421  NA 133* 135 138  K 4.1 3.9 4.4  CL 97* 101 105  CO2 25 25 27   GLUCOSE 111* 96 96  BUN 20 16 15   CREATININE 1.06 0.85 0.87  CALCIUM 8.6* 8.0* 8.3*   GFR: Estimated Creatinine Clearance: 109.7 mL/min (by C-G formula based on SCr of 0.87 mg/dL). Liver Function Tests: Recent Labs  Lab 09/09/19 1634 09/10/19 0600  AST 16 13*  ALT 16 16  ALKPHOS 139* 98  BILITOT 0.7 1.0  PROT 6.5 4.8*  ALBUMIN 3.3* 2.4*   No results for input(s): LIPASE, AMYLASE in the last 168 hours. No results for input(s): AMMONIA in the last 168 hours. Coagulation Profile: Recent Labs  Lab 09/09/19 1634  INR 1.2   Cardiac Enzymes: No results for input(s): CKTOTAL, CKMB, CKMBINDEX, TROPONINI in the last 168 hours. BNP (last 3 results) No results for input(s): PROBNP in the last 8760 hours. HbA1C: No results for input(s): HGBA1C in the last 72 hours. CBG: No results for input(s): GLUCAP in the last 168 hours. Lipid Profile: No results for input(s): CHOL, HDL, LDLCALC, TRIG, CHOLHDL, LDLDIRECT in the last 72 hours. Thyroid Function Tests: No results for input(s): TSH, T4TOTAL, FREET4, T3FREE, THYROIDAB in the last 72 hours. Anemia Panel: No results for input(s): VITAMINB12, FOLATE, FERRITIN, TIBC, IRON, RETICCTPCT in the last 72 hours. Sepsis Labs: Recent Labs  Lab  09/09/19 1634  LATICACIDVEN 1.2    Recent Results (from the past 240 hour(s))  Blood Culture (routine x 2)     Status: None (Preliminary result)   Collection Time: 09/09/19  4:35 PM   Specimen: BLOOD LEFT FOREARM  Result Value Ref Range Status   Specimen Description BLOOD LEFT FOREARM  Final   Special Requests   Final    BOTTLES DRAWN AEROBIC AND ANAEROBIC Blood Culture adequate volume   Culture   Final    NO GROWTH 2 DAYS Performed at [facility], [place] [address] [place], [place] [address]    Report Status PENDING  Incomplete  Blood Culture (routine x 2)     Status: None (Preliminary result)   Collection Time: 09/09/19  4:36 PM   Specimen: BLOOD  Result Value Ref Range Status   Specimen Description BLOOD LEFT ANTECUBITAL  Final   Special Requests   Final    BOTTLES DRAWN AEROBIC AND ANAEROBIC Blood Culture results may not be optimal due to an inadequate volume of blood received in culture bottles  Culture   Final    NO GROWTH 2 DAYS Performed at [facility], [place] [address] [place], [place] [address]    Report Status PENDING  Incomplete  Urine culture     Status: Abnormal   Collection Time: 09/09/19  5:01 PM   Specimen: In/Out Cath Urine  Result Value Ref Range Status   Specimen Description IN/OUT CATH URINE  Final   Special Requests NONE  Final   Culture (A)  Final    >=100,000 COLONIES/mL DIPHTHEROIDS(CORYNEBACTERIUM SPECIES) Standardized susceptibility testing for this organism is not available. Performed at [facility], [place] [address], [place], [place] [address]    Report Status 09/10/2019 FINAL  Final  SARS Coronavirus 2 by RT PCR (hospital order, performed in [facility] hospital lab) Nasopharyngeal Urine, Catheterized     Status: None   Collection Time: 09/09/19  5:01 PM   Specimen: Urine, Catheterized; Nasopharyngeal  Result Value Ref Range Status   SARS Coronavirus 2 NEGATIVE NEGATIVE Final    Comment: (NOTE) SARS-CoV-2 target nucleic  acids are NOT DETECTED. The SARS-CoV-2 RNA is generally detectable in upper and lower respiratory specimens during the acute phase of infection. The lowest concentration of SARS-CoV-2 viral copies this assay can detect is 250 copies / mL. A negative result does not preclude SARS-CoV-2 infection and should not be used as the sole basis for treatment or other patient management decisions.  A negative result may occur with improper specimen collection / handling, submission of specimen other than nasopharyngeal swab, presence of viral mutation(s) within the areas targeted by this assay, and inadequate number of viral copies (<250 copies / mL). A negative result must be combined with clinical observations, patient history, and epidemiological information. Fact Sheet for Patients:   StrictlyIdeas.no Fact Sheet for Healthcare Providers: BankingDealers.co.za This test is not yet approved or cleared  by the [place] FDA and has been authorized for detection and/or diagnosis of SARS-CoV-2 by FDA under an Emergency Use Authorization (EUA).  This EUA will remain in effect (meaning this test can be used) for the duration of the COVID-19 declaration under Section 564(b)(1) of the Act, 21 U.S.C. section 360bbb-3(b)(1), unless the authorization is terminated or revoked sooner. Performed at [facility], [place] [address], [place], [place] [address]          Radiology Studies: CT Head Wo Contrast  Result Date: 09/09/2019 CLINICAL DATA:  Altered mental status. EXAM: CT HEAD WITHOUT CONTRAST TECHNIQUE: Contiguous axial images were obtained from the base of the skull through the vertex without intravenous contrast. COMPARISON:  None. FINDINGS: Brain: Mild chronic ischemic white matter disease is noted. Minimal diffuse cortical atrophy is noted. 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 No mass effect or midline shift is  noted. Ventricular size is within normal limits. There is no evidence of mass lesion, hemorrhage or acute infarction. Vascular: No hyperdense vessel or unexpected calcification. Skull: Normal. Negative for fracture or focal lesion. Sinuses/Orbits: No acute finding. Other: None. IMPRESSION: Mild chronic ischemic white matter disease. Minimal diffuse cortical atrophy. No acute intracranial abnormality seen. Electronically Signed   By: Marijo Conception M.D.   On: 09/09/2019 19:31   DG Chest Port 1 View  Result Date: 09/09/2019 CLINICAL DATA:  Fever. EXAM: PORTABLE CHEST 1 VIEW COMPARISON:  June 01, 2011. FINDINGS: The heart size and mediastinal contours are within normal limits. Both lungs are clear. The visualized skeletal structures are unremarkable. IMPRESSION: No active disease.  Electronically Signed   By: Marijo Conception M.D.   On: 09/09/2019 17:08        Scheduled Meds: . ARIPiprazole  10 mg Oral Daily  . benztropine  0.5 mg Oral BID  . Chlorhexidine Gluconate Cloth  6 each Topical Daily  . enoxaparin (LOVENOX) injection  40 mg Subcutaneous Q24H  . OLANZapine  5 mg Oral BID  . risperidone  1 mg Oral BID  . tamsulosin  0.4 mg Oral Daily   Continuous Infusions: . sodium chloride 100 mL/hr at 09/10/19 0934  . ceFEPime (MAXIPIME) IV 2 g (09/11/19 0528)     LOS: 2 days    Time spent: 36 minutes spent on chart review, discussion with nursing staff, consultants, updating family and interview/physical exam; more than 50% of that time was spent in counseling and/or coordination of care.    Jari Carollo J [place] DO [facility] Available via Epic secure chat 7am-7pm After these hours, please refer to coverage provider listed on [email or web address] 09/11/2019, 2:23 PM

## 2019-09-11 NOTE — Plan of Care (Signed)
  Problem: Education: Goal: Knowledge of General Education information will improve Description: Including pain rating scale, medication(s)/side effects and non-pharmacologic comfort measures Outcome: Progressing   Problem: Safety: Goal: Ability to remain free from injury will improve Outcome: Progressing   

## 2019-09-11 NOTE — Plan of Care (Signed)
  Problem: Education: Goal: Knowledge of General Education information will improve Description: Including pain rating scale, medication(s)/side effects and non-pharmacologic comfort measures Outcome: Progressing   Problem: Health Behavior/Discharge Planning: Goal: Ability to manage health-related needs will improve Outcome: Progressing   Problem: Activity: Goal: Risk for activity intolerance will decrease Outcome: Progressing   Problem: Nutrition: Goal: Adequate nutrition will be maintained Outcome: Progressing   Problem: Activity: Goal: Risk for activity intolerance will decrease Outcome: Progressing   Problem: Coping: Goal: Level of anxiety will decrease Outcome: Progressing   Problem: Elimination: Goal: Will not experience complications related to bowel motility Outcome: Progressing

## 2019-09-11 NOTE — Evaluation (Signed)
Physical Therapy Evaluation Patient Details Name: Lee House MRN: [id number] DOB: [birth year] Today's Date: 09/11/2019   History of Present Illness  Pt is a 54 y.o. M resident of [facility] facility with significant PMH of neurogenic bladder wtih Foley, bipolar + schizoaffective disorder, COPD, recent right femur fracture s/p IMN [place] 08/20/2019 who presents with toxic metabolic encephalopathy combined from UTI vs polypharmacy and acute urinary retention.  Clinical Impression  Pt admitted with above. Presents with decreased functional mobility secondary to weakness, balance impairments, and decreased cognition. Ambulating 30 feet with a walker at a min guard assist level (further distance deferred as pt requesting to eat his lunch). Recommend return to SNF upon discharge. Will continue to follow acutely to progress mobility as tolerated.     Follow Up Recommendations SNF;Supervision/Assistance - 24 hour    Equipment Recommendations  Other (comment)(defer)    Recommendations for Other Services       Precautions / Restrictions Precautions Precautions: Fall Restrictions Weight Bearing Restrictions: No Other Position/Activity Restrictions: WBAT per Care Everywhere notes      Mobility  Bed Mobility Overal bed mobility: Needs Assistance Bed Mobility: Supine to Sit     Supine to sit: Min guard        Transfers Overall transfer level: Needs assistance Equipment used: Rolling walker (2 wheeled) Transfers: Sit to/from Stand Sit to Stand: Min guard            Ambulation/Gait Ambulation/Gait assistance: Min guard;+2 safety/equipment Gait Distance (Feet): 30 Feet Assistive device: Rolling walker (2 wheeled) Gait Pattern/deviations: Step-through pattern Gait velocity: decreased   General Gait Details: Min guard for stability, +2 safety for lines. Pt mildly impulsive. Further distance deferred as pt requesting to eat  Stairs             Wheelchair Mobility    Modified Rankin (Stroke Patients Only)       Balance Overall balance assessment: Needs assistance Sitting-balance support: Feet supported Sitting balance-Leahy Scale: Good     Standing balance support: Bilateral upper extremity supported Standing balance-Leahy Scale: Fair                               Pertinent Vitals/Pain Pain Assessment: Faces Faces Pain Scale: No hurt    Home Living Family/patient expects to be discharged to:: Skilled nursing facility                      Prior Function Level of Independence: Needs assistance      ADL's / Homemaking Assistance Needed: States he was receiving intermittent assist with ADL's at [facility]  Comments: Pt unclear historian     Hand Dominance        Extremity/Trunk Assessment   Upper Extremity Assessment Upper Extremity Assessment: Overall WFL for tasks assessed    Lower Extremity Assessment Lower Extremity Assessment: RLE deficits/detail RLE Deficits / Details: Recent R femur fx s/p IMN       Communication   Communication: No difficulties  Cognition Arousal/Alertness: Awake/alert Behavior During Therapy: Impulsive Overall Cognitive Status: Impaired/Different from baseline Area of Impairment: Orientation;Safety/judgement                 Orientation Level: Disoriented to;Time       Safety/Judgement: Decreased awareness of deficits;Decreased awareness of safety     General Comments: Pt stating month was April and year was 1925. Demonstrates mild impulsivity but able to be redirected. One episode  of agitation when Foley was not placed how he liked it; able to resolve.      General Comments      Exercises     Assessment/Plan    PT Assessment Patient needs continued PT services  PT Problem List Decreased strength;Decreased activity tolerance;Decreased balance;Decreased mobility;Decreased cognition;Decreased safety awareness       PT  Treatment Interventions DME instruction;Gait training;Functional mobility training;Therapeutic activities;Therapeutic exercise;Balance training;Patient/family education    PT Goals (Current goals can be found in the Care Plan section)  Acute Rehab PT Goals Patient Stated Goal: eat lunch PT Goal Formulation: With patient Time For Goal Achievement: 09/25/19 Potential to Achieve Goals: Good    Frequency Min 2X/week   Barriers to discharge        Co-evaluation               AM-PAC PT "6 Clicks" Mobility  Outcome Measure Help needed turning from your back to your side while in a flat bed without using bedrails?: None Help needed moving from lying on your back to sitting on the side of a flat bed without using bedrails?: A Little Help needed moving to and from a bed to a chair (including a wheelchair)?: A Little Help needed standing up from a chair using your arms (e.g., wheelchair or bedside chair)?: A Little Help needed to walk in hospital room?: A Little Help needed climbing 3-5 steps with a railing? : A Little 6 Click Score: 19    End of Session Equipment Utilized During Treatment: Gait belt Activity Tolerance: Patient tolerated treatment well Patient left: in chair;with call bell/phone within reach;with chair alarm set Nurse Communication: Mobility status PT Visit Diagnosis: Unsteadiness on feet (R26.81);Other abnormalities of gait and mobility (R26.89);Difficulty in walking, not elsewhere classified (R26.2)    Time: [id number] PT Time Calculation (min) (ACUTE ONLY): 15 min   Charges:   PT Evaluation $PT Eval Moderate Complexity: 1 Mod            Wyona Almas, PT, DPT Acute Rehabilitation Services Pager [phone] Office [phone]   Deno Etienne 09/11/2019, 1:57 PM

## 2019-09-12 MED ORDER — OXYCODONE HCL 5 MG PO TABS: 5.0000 mg | ORAL_TABLET | Freq: Four times a day (QID) | ORAL | 0 refills | Status: DC | PRN

## 2019-09-12 MED ORDER — CEFDINIR 300 MG PO CAPS: 300.0000 mg | ORAL_CAPSULE | Freq: Two times a day (BID) | ORAL | 0 refills | Status: AC

## 2019-09-12 MED ORDER — ALPRAZOLAM 0.25 MG PO TABS: 0.2500 mg | ORAL_TABLET | Freq: Two times a day (BID) | ORAL | 0 refills | Status: DC | PRN

## 2019-09-12 NOTE — TOC Progression Note (Signed)
Transition of Care [facility]) - Progression Note    Patient Details  Name: Lee House MRN: [id number] Date of Birth: Oct 11, 1965  Transition of [facility] CM/SW Contact  Curlene Labrum, RN Phone Number: 09/12/2019, 2:49 PM  Clinical Narrative:    Case management called [facility] and they are holding a bed for the patient.  I called [facility] and sent all necessary clinicals to confirm insurance authorization to return to the facility.  Waiting on call back from [facility] before the patient can be transported back to the facility via [facility].   Expected Discharge Plan: [place]    Expected Discharge Plan and Services Expected Discharge Plan: [place]   Discharge Planning Services: CM Consult   Living arrangements for the past 2 months: [place] Expected Discharge Date: 09/12/19                                     Social Determinants of Health (SDOH) Interventions    Readmission Risk Interventions Readmission Risk Prevention Plan 09/12/2019  Post Dischage Appt Complete  Medication Screening Complete  Transportation Screening Complete  Some recent data might be hidden

## 2019-09-12 NOTE — NC FL2 (Signed)
[place] LEVEL OF CARE SCREENING TOOL     IDENTIFICATION  Patient Name: Lee House Birthdate: Mar 23, 1966 Sex: male Admission Date (Current Location): 09/09/2019  [place] and [place] Number:  Kathleen Argue [id number] Facility and Address:  [facility] [place], [place] [address], [place],  [address]      Provider Number: [id number]  Attending Physician Name and Address:  [place] Eric J, DO  Relative Name and Phone Number:  Richarda Blade [phone]    Current Level of Care: Hospital Recommended Level of Care: [place] Prior Approval Number:    Date Approved/Denied:   PASRR Number:    Discharge Plan: SNF    Current Diagnoses: Patient Active Problem List   Diagnosis Date Noted  . AMS (altered mental status) 09/09/2019  . Acute lower UTI 09/09/2019  . Schizophrenia ([place]) 09/09/2019  . COPD with acute bronchitis ([place]) 09/09/2019  . Distal radial fracture 02/27/2012  . Anemia 06/02/2011  . Cellulitis and abscess of leg 06/01/2011  . Altered mental status 06/01/2011  . Thrombocytopenia ([place]) 06/01/2011    Orientation RESPIRATION BLADDER Height & Weight     Self  Normal Indwelling catheter Weight: 88.5 kg Height:  6\' 1"  (185.4 cm)  BEHAVIORAL SYMPTOMS/MOOD NEUROLOGICAL BOWEL NUTRITION STATUS      Continent Diet(See discharge summary)  AMBULATORY STATUS COMMUNICATION OF NEEDS Skin   Limited Assist Verbally Surgical wounds                       Personal Care Assistance Level of Assistance  Bathing, Dressing Bathing Assistance: Limited assistance   Dressing Assistance: Limited assistance     Functional Limitations Info  Sight, Hearing, Speech Sight Info: Adequate Hearing Info: Adequate Speech Info: Adequate    SPECIAL CARE FACTORS FREQUENCY  PT (By licensed PT), OT (By licensed OT)     PT Frequency: 5 times per week OT Frequency: 5 times per week            Contractures Contractures Info:  Present(booties to prevent heel breakdown and foot drop)    Additional Factors Info  Code Status, Allergies Code Status Info: Full Allergies Info: Penicillin           Current Medications (09/12/2019):  This is the current hospital active medication list Current Facility-Administered Medications  Medication Dose Route Frequency Provider Last Rate Last Admin  . acetaminophen (TYLENOL) tablet 650 mg  650 mg Oral Q6H PRN Elwyn Reach, MD       Or  . acetaminophen (TYLENOL) suppository 650 mg  650 mg Rectal Q6H PRN Gala Romney L, MD      . ARIPiprazole (ABILIFY) tablet 10 mg  10 mg Oral Daily Nita Sells, MD   10 mg at 09/12/19 0904  . benztropine (COGENTIN) tablet 0.5 mg  0.5 mg Oral BID Nita Sells, MD   0.5 mg at 09/12/19 0904  . cefdinir (OMNICEF) capsule 300 mg  300 mg Oral Q12H [place] Donnamarie Poag, DO   300 mg at 09/12/19 [id number]  . Chlorhexidine Gluconate Cloth 2 % PADS 6 each  6 each Topical Daily Elwyn Reach, MD   6 each at 09/12/19 0905  . enoxaparin (LOVENOX) injection 40 mg  40 mg Subcutaneous Q24H Gala Romney L, MD   40 mg at 09/12/19 0905  . OLANZapine (ZYPREXA) tablet 5 mg  5 mg Oral BID Nita Sells, MD   5 mg at 09/12/19 0905  . ondansetron (ZOFRAN) tablet 4 mg  4 mg Oral Q6H PRN Elwyn Reach, MD       Or  . ondansetron (ZOFRAN) injection 4 mg  4 mg Intravenous Q6H PRN Gala Romney L, MD      . risperiDONE (RISPERDAL) tablet 1 mg  1 mg Oral BID Nita Sells, MD   1 mg at 09/12/19 0904  . tamsulosin (FLOMAX) capsule 0.4 mg  0.4 mg Oral Daily Nita Sells, MD   0.4 mg at 09/12/19 0904  . traZODone (DESYREL) tablet 50 mg  50 mg Oral QHS PRN Nita Sells, MD         Discharge Medications: Please see discharge summary for a list of discharge medications.  Relevant Imaging Results:  Relevant Lab Results:   Additional Information SS# [id number]  Curlene Labrum, RN

## 2019-09-12 NOTE — TOC Initial Note (Signed)
Transition of Care [facility]) - Initial/Assessment Note    Patient Details  Name: Lee House MRN: [id number] Date of Birth: 11-04-1965  Transition of Care [facility]) CM/SW Contact:    Curlene Labrum, RN Phone Number: 09/12/2019, 9:56 AM  Clinical Narrative:                 Case management met with the patient at the bedside - patient only oriented to self and time.  Called [name], ALF care provider, and confirmed family number to contact - [name], legal guardian,  at [phone] to notify him that the patient would be returning to [facility] today. Will continue to assess for discharge needs.  Expected Discharge Plan: [place]     Patient Goals and CMS Choice Patient states their goals for this hospitalization and ongoing recovery are:: Patient is not oriented to time or situation.      Expected Discharge Plan and Services Expected Discharge Plan: [place]   Discharge Planning Services: CM Consult   Living arrangements for the past 2 months: [place]                                      Prior Living Arrangements/Services Living arrangements for the past 2 months: [place] Lives with:: Other (Comment)(Cared Provider is [name] at [phone]) Patient language and need for interpreter reviewed:: Yes Do you feel safe going back to the place where you live?: Yes      Need for Family Participation in Patient Care: Yes (Comment) Care giver support system in place?: Yes (comment)   Criminal Activity/Legal Involvement Pertinent to Current Situation/Hospitalization: No - Comment as needed  Activities of Daily Living      Permission Sought/Granted Permission sought to share information with : Case Manager       Permission granted to share info w AGENCY: [facility]  Permission granted to share info w Relationship: Left message with [name] at [phone]      Emotional Assessment Appearance:: Appears older than stated age Attitude/Demeanor/Rapport: Engaged, Inconsistent Affect (typically observed): Accepting Orientation: : Oriented to Self Alcohol / Substance Use: Not Applicable Psych Involvement: No (comment)  Admission diagnosis:  Urinary retention [R33.9] Acute UTI [N39.0] AMS (altered mental status) [R41.82] Sepsis without acute organ dysfunction, due to unspecified organism [facility]) [A41.9] Patient Active Problem List   Diagnosis Date Noted  . AMS (altered mental status) 09/09/2019  . Acute lower UTI 09/09/2019  . Schizophrenia ([place]) 09/09/2019  . COPD with acute bronchitis ([place]) 09/09/2019  . Distal radial fracture 02/27/2012  . Anemia 06/02/2011  . Cellulitis and abscess of leg 06/01/2011  . Altered mental status 06/01/2011  . Thrombocytopenia ([place]) 06/01/2011   PCP:  Patient, [name] Per Pharmacy:  No Pharmacies Listed    Social Determinants of Health (SDOH) Interventions    Readmission Risk Interventions Readmission Risk Prevention Plan 09/12/2019  Post Dischage Appt Complete  Medication Screening Complete  Transportation Screening Complete  Some recent data might be hidden

## 2019-09-12 NOTE — Discharge Summary (Addendum)
Physician Discharge Summary  Lee House [id number] DOB: 03/30/1966 DOA: 09/09/2019  PCP: Patient, [name] Per  Admit date: 09/09/2019 Discharge date: 09/13/2019  Admitted From: [facility] Disposition: [facility]  Recommendations for Outpatient Follow-up:  1. Follow up with PCP/physician at facility in 1-2 weeks 2. Outpatient follow-up with orthopedics for recent hip fracture 3. Please obtain BMP/CBC in one week 4. Obtain Depakote level in 1-2 weeks 5. Continue antibiotics with cefdinir 300 mg p.o. twice daily to complete a 10-day course for UTI 6. Ensure to monitor urine output given his chronic Foley catheter for neurogenic bladder, as this was obstructed on admission which is the leading etiology to his hospitalization  Home Health: No Equipment/Devices: None  Discharge Condition: Stable CODE STATUS: Full code Diet recommendation: Regular diet  History of present illness:  Lee House is a 54 year old Caucasian male with past medical history notable for neurogenic bladder with chronic Foley catheter, bipolar and schizoaffective disorder, COPD, recent right femur fracture status post IM nail at [facility] 5/4, who presented to the ED with confusion, fever 103.0 F likely secondary to UTI versus polypharmacy with acute urinary retention.  Hospital course:  Acute metabolic encephalopathy Etiology likely multifactorial with combined UTI versus polypharmacy. UDS positive for benzos. HIV nonreactive. CT head with no acute intracranial abnormalities and chest x-ray with no acute cardiopulmonary disease process. Continue treatment with antibiotics as depicted below. Decrease olanzapine from 10 mg to 5 mg twice daily; and discontinued 15 mg nighttime dose. Mental status now seems to be back at baseline  UTI Urinalysis with negative leukoesterase, positive nitrite, many bacteria and 0-5 WBCs. Urine culture >100K DIPHTHEROIDS(CORYNEBACTERIUM SPECIES). Cefepime to  cefdinir 300 mg p.o. twice daily to complete 10-day antibiotic course.  Acute urinary retention Hx Neurogenic bladder Patient with history of neurogenic bladder with chronic Foley catheter in place. Patient with abdominal distention, replaced Foley catheter with immediate return of 2.7 L of urine. Continue tamsulosin 0.4 mg p.o. daily.  Bipolar disorder Schizoaffective disorder Olanzapine decreased from 10 mg to 5 mg p.o. twice daily, with discontinuation of the 15 mg nighttime dose. Continue Risperdal 1 mg PO BID, cogentin 0.5mg  BID, Abilify 10 mg p.o. daily, and Depakote.  Consider checking Depakote level in 1-2 weeks.  Recent right femur fracture Recent ORIF with IM nail at [facility]. PT following, recommend return to SNF.  Continue outpatient follow-up with orthopedics as scheduled.  Discharge Diagnoses:  Principal Problem:   AMS (altered mental status) Active Problems:   Acute lower UTI   Schizophrenia ([place])   COPD with acute bronchitis [facility])    Discharge Instructions  Discharge Instructions    Call MD for:  difficulty breathing, headache or visual disturbances   Complete by: As directed    Call MD for:  extreme fatigue   Complete by: As directed    Call MD for:  persistant dizziness or light-headedness   Complete by: As directed    Call MD for:  persistant nausea and vomiting   Complete by: As directed    Call MD for:  severe uncontrolled pain   Complete by: As directed    Call MD for:  temperature >100.4   Complete by: As directed    Diet - low sodium heart healthy   Complete by: As directed    Increase activity slowly   Complete by: As directed      Allergies as of 09/13/2019      Reactions   Penicillins Anaphylaxis  Medication List    STOP taking these medications   hydrOXYzine 25 MG tablet Commonly known as: ATARAX/VISTARIL     TAKE these medications   albuterol 108 (90 Base) MCG/ACT inhaler Commonly known as: VENTOLIN  HFA Inhale 2 puffs into the lungs every 4 (four) hours as needed. As needed for shortness of breath.   ALPRAZolam 0.25 MG tablet Commonly known as: XANAX Take 1 tablet (0.25 mg total) by mouth 2 (two) times daily as needed for anxiety.   ARIPiprazole 10 MG tablet Commonly known as: ABILIFY Take 5 mg by mouth in the morning and at bedtime.   aspirin EC 325 MG tablet Take 325 mg by mouth in the morning and at bedtime.   benztropine 0.5 MG tablet Commonly known as: COGENTIN Take 0.5 mg by mouth 2 (two) times daily. What changed: Another medication with the same name was removed. Continue taking this medication, and follow the directions you see here.   Calcium 500+D High Potency 500-400 MG-UNIT tablet Generic drug: calcium-vitamin D Take 1 tablet by mouth daily.   cefdinir 300 MG capsule Commonly known as: OMNICEF Take 1 capsule (300 mg total) by mouth every 12 (twelve) hours for 8 days.   divalproex 125 MG DR tablet Commonly known as: DEPAKOTE Take 125 mg by mouth 3 (three) times daily.   enoxaparin 40 MG/0.4ML injection Commonly known as: LOVENOX Inject 40 mg into the skin daily. X 14 days   ferrous sulfate 325 (65 FE) MG tablet Take 325 mg by mouth in the morning and at bedtime.   Fluticasone-Salmeterol 250-50 MCG/DOSE Aepb Commonly known as: ADVAIR Inhale 1 puff into the lungs every 12 (twelve) hours.   meloxicam 7.5 MG tablet Commonly known as: MOBIC Take 7.5 mg by mouth daily.   Multi-Vitamin tablet Take 1 tablet by mouth daily.   OLANZapine 10 MG tablet Commonly known as: ZYPREXA Take 10 mg by mouth 2 (two) times daily. What changed: Another medication with the same name was removed. Continue taking this medication, and follow the directions you see here.   oxyCODONE 5 MG immediate release tablet Commonly known as: Oxy IR/ROXICODONE Take 1 tablet (5 mg total) by mouth every 6 (six) hours as needed for moderate pain.   pantoprazole 40 MG tablet Commonly  known as: PROTONIX Take 40 mg by mouth daily.   risperidone 4 MG tablet Commonly known as: RISPERDAL Take 1 mg by mouth 2 (two) times daily.   senna-docusate 8.6-50 MG tablet Commonly known as: Senokot-S Take 1 tablet by mouth 2 (two) times daily as needed for constipation.   tamsulosin 0.4 MG Caps capsule Commonly known as: FLOMAX Take 0.4 mg by mouth daily.   traZODone 50 MG tablet Commonly known as: DESYREL Take 50 mg by mouth at bedtime as needed for sleep.       Allergies  Allergen Reactions  . Penicillins Anaphylaxis    Consultations:  none   Procedures/Studies: CT Head Wo Contrast  Result Date: 09/09/2019 CLINICAL DATA:  Altered mental status. EXAM: CT HEAD WITHOUT CONTRAST TECHNIQUE: Contiguous axial images were obtained from the base of the skull through the vertex without intravenous contrast. COMPARISON:  None. FINDINGS: Brain: Mild chronic ischemic white matter disease is noted. Minimal diffuse cortical atrophy is noted. 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 No mass effect or midline shift is noted. Ventricular size is within normal limits. There is no evidence of mass lesion, hemorrhage or acute infarction. Vascular: No hyperdense vessel or  unexpected calcification. Skull: Normal. Negative for fracture or focal lesion. Sinuses/Orbits: No acute finding. Other: None. IMPRESSION: Mild chronic ischemic white matter disease. Minimal diffuse cortical atrophy. No acute intracranial abnormality seen. Electronically Signed   By: Marijo Conception M.D.   On: 09/09/2019 19:31   DG Chest Port 1 View  Result Date: 09/09/2019 CLINICAL DATA:  Fever. EXAM: PORTABLE CHEST 1 VIEW COMPARISON:  June 01, 2011. FINDINGS: The heart size and mediastinal contours are within normal limits. Both lungs are clear. The visualized skeletal structures are unremarkable. IMPRESSION: No active disease. Electronically Signed   By: Marijo Conception M.D.   On:  09/09/2019 17:08     Subjective: Patient seen and examined at bedside, resting comfortably.  States had poor sleep overnight as he was woken up on several occasions and wants to know where his "cookies were".  No other complaints or concerns at this time.  Happy that he is returning to his rehab facility.  Denies headache, no chest pain, no palpitation, no shortness of breath, no abdominal pain.  No acute events overnight per nursing staff.  Discharge Exam: Vitals:   09/12/19 2011 09/13/19 0819  BP: (!) 107/54 100/72  Pulse: 85 89  Resp: 16   Temp: 98.6 F (37 C) 97.9 F (36.6 C)  SpO2: 100% 100%   Vitals:   09/12/19 0726 09/12/19 1416 09/12/19 2011 09/13/19 0819  BP: 101/69 (!) 102/59 (!) 107/54 100/72  Pulse: 92 77 85 89  Resp: 17 15 16    Temp: 98.3 F (36.8 C) 98 F (36.7 C) 98.6 F (37 C) 97.9 F (36.6 C)  TempSrc: Oral Oral  Oral  SpO2: 95% 99% 100% 100%  Weight:      Height:        General: Pt is alert, awake, not in acute distress Cardiovascular: RRR, S1/S2 +, no rubs, no gallops Respiratory: CTA bilaterally, no wheezing, no rhonchi Abdominal: Soft, NT, ND, bowel sounds + Extremities: no edema, no cyanosis    The results of significant diagnostics from this hospitalization (including imaging, microbiology, ancillary and laboratory) are listed below for reference.     Microbiology: Recent Results (from the past 240 hour(s))  Blood Culture (routine x 2)     Status: None (Preliminary result)   Collection Time: 09/09/19  4:35 PM   Specimen: BLOOD LEFT FOREARM  Result Value Ref Range Status   Specimen Description BLOOD LEFT FOREARM  Final   Special Requests   Final    BOTTLES DRAWN AEROBIC AND ANAEROBIC Blood Culture adequate volume   Culture   Final    NO GROWTH 4 DAYS Performed at [facility], [address] [place], [place] [address]    Report Status PENDING  Incomplete  Blood Culture (routine x 2)     Status: None (Preliminary result)    Collection Time: 09/09/19  4:36 PM   Specimen: BLOOD  Result Value Ref Range Status   Specimen Description BLOOD LEFT ANTECUBITAL  Final   Special Requests   Final    BOTTLES DRAWN AEROBIC AND ANAEROBIC Blood Culture results may not be optimal due to an inadequate volume of blood received in culture bottles   Culture   Final    NO GROWTH 4 DAYS Performed at [facility], [place] [address] [place], [place] [address]    Report Status PENDING  Incomplete  Urine culture     Status: Abnormal   Collection Time: 09/09/19  5:01 PM   Specimen:  In/Out Cath Urine  Result Value Ref Range Status   Specimen Description IN/OUT CATH URINE  Final   Special Requests NONE  Final   Culture (A)  Final    >=100,000 COLONIES/mL DIPHTHEROIDS(CORYNEBACTERIUM SPECIES) Standardized susceptibility testing for this organism is not available. Performed at [facility], [place] [address] [place], [place] [address]    Report Status 09/10/2019 FINAL  Final  SARS Coronavirus 2 by RT PCR (hospital order, performed in [facility] hospital lab) Nasopharyngeal Urine, Catheterized     Status: None   Collection Time: 09/09/19  5:01 PM   Specimen: Urine, Catheterized; Nasopharyngeal  Result Value Ref Range Status   SARS Coronavirus 2 NEGATIVE NEGATIVE Final    Comment: (NOTE) SARS-CoV-2 target nucleic acids are NOT DETECTED. The SARS-CoV-2 RNA is generally detectable in upper and lower respiratory specimens during the acute phase of infection. The lowest concentration of SARS-CoV-2 viral copies this assay can detect is 250 copies / mL. A negative result does not preclude SARS-CoV-2 infection and should not be used as the sole basis for treatment or other patient management decisions.  A negative result may occur with improper specimen collection / handling, submission of specimen other than nasopharyngeal swab, presence of viral mutation(s) within the areas targeted by this assay, and inadequate number of viral  copies (<250 copies / mL). A negative result must be combined with clinical observations, patient history, and epidemiological information. Fact Sheet for Patients:   StrictlyIdeas.no Fact Sheet for Healthcare Providers: BankingDealers.co.za This test is not yet approved or cleared  by the [place] FDA and has been authorized for detection and/or diagnosis of SARS-CoV-2 by FDA under an Emergency Use Authorization (EUA).  This EUA will remain in effect (meaning this test can be used) for the duration of the COVID-19 declaration under Section 564(b)(1) of the Act, 21 U.S.C. section 360bbb-3(b)(1), unless the authorization is terminated or revoked sooner. Performed at [facility], [place] [address], [place], [place] [address]   SARS CORONAVIRUS 2 (TAT 6-24 HRS) Nasopharyngeal Nasopharyngeal Swab     Status: None   Collection Time: 09/11/19  9:41 AM   Specimen: Nasopharyngeal Swab  Result Value Ref Range Status   SARS Coronavirus 2 NEGATIVE NEGATIVE Final    Comment: (NOTE) SARS-CoV-2 target nucleic acids are NOT DETECTED. The SARS-CoV-2 RNA is generally detectable in upper and lower respiratory specimens during the acute phase of infection. Negative results do not preclude SARS-CoV-2 infection, do not rule out co-infections with other pathogens, and should not be used as the sole basis for treatment or other patient management decisions. Negative results must be combined with clinical observations, patient history, and epidemiological information. The expected result is Negative. Fact Sheet for Patients: SugarRoll.be Fact Sheet for Healthcare Providers: https://www.woods-mathews.com/ This test is not yet approved or cleared by the [place] FDA and  has been authorized for detection and/or diagnosis of SARS-CoV-2 by FDA under an Emergency Use Authorization (EUA). This EUA will remain   in effect (meaning this test can be used) for the duration of the COVID-19 declaration under Section 56 4(b)(1) of the Act, 21 U.S.C. section 360bbb-3(b)(1), unless the authorization is terminated or revoked sooner. Performed at [facility], [place] [address] [place], [place] [address]      Labs: BNP (last 3 results) No results for input(s): BNP in the last 8760 hours. Basic Metabolic Panel: Recent Labs  Lab 09/09/19 1634 09/10/19 0600 09/11/19 0421  NA 133* 135 138  K 4.1 3.9 4.4  CL 97* 101 105  CO2 25 25 27   GLUCOSE 111* 96 96  BUN 20 16 15   CREATININE 1.06 0.85 0.87  CALCIUM 8.6* 8.0* 8.3*   Liver Function Tests: Recent Labs  Lab 09/09/19 1634 09/10/19 0600  AST 16 13*  ALT 16 16  ALKPHOS 139* 98  BILITOT 0.7 1.0  PROT 6.5 4.8*  ALBUMIN 3.3* 2.4*   No results for input(s): LIPASE, AMYLASE in the last 168 hours. No results for input(s): AMMONIA in the last 168 hours. CBC: Recent Labs  Lab 09/09/19 1634 09/10/19 0600 09/11/19 0421  WBC 17.6* 8.7 4.6  NEUTROABS 15.7*  --  3.0  HGB 11.8* 9.4* 9.8*  HCT 37.4* 30.0* 30.8*  MCV 92.8 91.7 92.5  PLT 403* 276 293   Cardiac Enzymes: No results for input(s): CKTOTAL, CKMB, CKMBINDEX, TROPONINI in the last 168 hours. BNP: Invalid input(s): POCBNP CBG: No results for input(s): GLUCAP in the last 168 hours. D-Dimer No results for input(s): DDIMER in the last 72 hours. Hgb A1c No results for input(s): HGBA1C in the last 72 hours. Lipid Profile No results for input(s): CHOL, HDL, LDLCALC, TRIG, CHOLHDL, LDLDIRECT in the last 72 hours. Thyroid function studies No results for input(s): TSH, T4TOTAL, T3FREE, THYROIDAB in the last 72 hours.  Invalid input(s): FREET3 Anemia work up No results for input(s): VITAMINB12, FOLATE, FERRITIN, TIBC, IRON, RETICCTPCT in the last 72 hours. Urinalysis    Component Value Date/Time   COLORURINE YELLOW 09/09/2019 1701   APPEARANCEUR CLEAR 09/09/2019 1701   LABSPEC  1.010 09/09/2019 1701   PHURINE 7.0 09/09/2019 1701   GLUCOSEU NEGATIVE 09/09/2019 1701   HGBUR LARGE (A) 09/09/2019 1701   BILIRUBINUR NEGATIVE 09/09/2019 1701   KETONESUR NEGATIVE 09/09/2019 1701   PROTEINUR NEGATIVE 09/09/2019 1701   UROBILINOGEN 0.2 05/12/2013 2100   NITRITE POSITIVE (A) 09/09/2019 1701   LEUKOCYTESUR NEGATIVE 09/09/2019 1701   Sepsis Labs Invalid input(s): PROCALCITONIN,  WBC,  LACTICIDVEN Microbiology Recent Results (from the past 240 hour(s))  Blood Culture (routine x 2)     Status: None (Preliminary result)   Collection Time: 09/09/19  4:35 PM   Specimen: BLOOD LEFT FOREARM  Result Value Ref Range Status   Specimen Description BLOOD LEFT FOREARM  Final   Special Requests   Final    BOTTLES DRAWN AEROBIC AND ANAEROBIC Blood Culture adequate volume   Culture   Final    NO GROWTH 4 DAYS Performed at [facility], [place] [address], [place], [place] [address]    Report Status PENDING  Incomplete  Blood Culture (routine x 2)     Status: None (Preliminary result)   Collection Time: 09/09/19  4:36 PM   Specimen: BLOOD  Result Value Ref Range Status   Specimen Description BLOOD LEFT ANTECUBITAL  Final   Special Requests   Final    BOTTLES DRAWN AEROBIC AND ANAEROBIC Blood Culture results may not be optimal due to an inadequate volume of blood received in culture bottles   Culture   Final    NO GROWTH 4 DAYS Performed at [facility], [place] [address], [place], [place] [address]    Report Status PENDING  Incomplete  Urine culture     Status: Abnormal   Collection Time: 09/09/19  5:01 PM   Specimen: In/Out Cath Urine  Result Value Ref Range Status   Specimen Description IN/OUT CATH URINE  Final   Special Requests NONE  Final   Culture (  A)  Final    >=100,000 COLONIES/mL DIPHTHEROIDS(CORYNEBACTERIUM SPECIES) Standardized susceptibility testing for this organism is not available. Performed at [facility], [place] [address] [place], [place]  [address]    Report Status 09/10/2019 FINAL  Final  SARS Coronavirus 2 by RT PCR (hospital order, performed in [facility] hospital lab) Nasopharyngeal Urine, Catheterized     Status: None   Collection Time: 09/09/19  5:01 PM   Specimen: Urine, Catheterized; Nasopharyngeal  Result Value Ref Range Status   SARS Coronavirus 2 NEGATIVE NEGATIVE Final    Comment: (NOTE) SARS-CoV-2 target nucleic acids are NOT DETECTED. The SARS-CoV-2 RNA is generally detectable in upper and lower respiratory specimens during the acute phase of infection. The lowest concentration of SARS-CoV-2 viral copies this assay can detect is 250 copies / mL. A negative result does not preclude SARS-CoV-2 infection and should not be used as the sole basis for treatment or other patient management decisions.  A negative result may occur with improper specimen collection / handling, submission of specimen other than nasopharyngeal swab, presence of viral mutation(s) within the areas targeted by this assay, and inadequate number of viral copies (<250 copies / mL). A negative result must be combined with clinical observations, patient history, and epidemiological information. Fact Sheet for Patients:   StrictlyIdeas.no Fact Sheet for Healthcare Providers: BankingDealers.co.za This test is not yet approved or cleared  by the [place] FDA and has been authorized for detection and/or diagnosis of SARS-CoV-2 by FDA under an Emergency Use Authorization (EUA).  This EUA will remain in effect (meaning this test can be used) for the duration of the COVID-19 declaration under Section 564(b)(1) of the Act, 21 U.S.C. section 360bbb-3(b)(1), unless the authorization is terminated or revoked sooner. Performed at [facility], [place] [address], [place], [place] [address]   SARS CORONAVIRUS 2 (TAT 6-24 HRS) Nasopharyngeal Nasopharyngeal Swab     Status: None   Collection Time:  09/11/19  9:41 AM   Specimen: Nasopharyngeal Swab  Result Value Ref Range Status   SARS Coronavirus 2 NEGATIVE NEGATIVE Final    Comment: (NOTE) SARS-CoV-2 target nucleic acids are NOT DETECTED. The SARS-CoV-2 RNA is generally detectable in upper and lower respiratory specimens during the acute phase of infection. Negative results do not preclude SARS-CoV-2 infection, do not rule out co-infections with other pathogens, and should not be used as the sole basis for treatment or other patient management decisions. Negative results must be combined with clinical observations, patient history, and epidemiological information. The expected result is Negative. Fact Sheet for Patients: SugarRoll.be Fact Sheet for Healthcare Providers: https://www.woods-mathews.com/ This test is not yet approved or cleared by the [place] FDA and  has been authorized for detection and/or diagnosis of SARS-CoV-2 by FDA under an Emergency Use Authorization (EUA). This EUA will remain  in effect (meaning this test can be used) for the duration of the COVID-19 declaration under Section 56 4(b)(1) of the Act, 21 U.S.C. section 360bbb-3(b)(1), unless the authorization is terminated or revoked sooner. Performed at [facility], [place] [address], [place], [place] [address]      Time coordinating discharge: Over 30 minutes  SIGNED:   Dayanne Yiu J [place] DO  [facility] 09/13/2019, 9:19 AM

## 2019-09-13 NOTE — Progress Notes (Signed)
Physical Therapy Treatment Patient Details Name: Lee House MRN: [id number] DOB: 11-Nov-1965 Today's Date: 09/13/2019    History of Present Illness Pt is a 54 y.o. M resident of [facility] with significant PMH of neurogenic bladder wtih Foley, bipolar + schizoaffective disorder, COPD, recent right femur fracture s/p IMN [place] 08/20/2019 who presents with toxic metabolic encephalopathy combined from UTI vs polypharmacy and acute urinary retention.    PT Comments     Pt making steady progress towards his physical therapy goals; denies pain. Not oriented to place (city) or time, but able to consistently follow 1 step commands. Ambulating hallway distances with a walker at a min guard assist level. Increased antalgic gait pattern with distance. Presents as a high fall risk based on decreased gait speed and decreased safety awareness. Continue to recommend SNF for ongoing Physical Therapy.     Follow Up Recommendations  SNF;Supervision/Assistance - 24 hour     Equipment Recommendations  Other (comment)(defer)    Recommendations for Other Services       Precautions / Restrictions Precautions Precautions: Fall Restrictions Weight Bearing Restrictions: No Other Position/Activity Restrictions: WBAT per Care Everywhere notes    Mobility  Bed Mobility Overal bed mobility: Needs Assistance Bed Mobility: Supine to Sit     Supine to sit: Supervision        Transfers Overall transfer level: Needs assistance Equipment used: Rolling walker (2 wheeled) Transfers: Sit to/from Stand Sit to Stand: Min guard         General transfer comment: Min guard for safety with transition to standing from edge of bed and toilet  Ambulation/Gait Ambulation/Gait assistance: Min guard Gait Distance (Feet): 540 Feet Assistive device: Rolling walker (2 wheeled) Gait Pattern/deviations: Step-through pattern;Decreased stride length;Antalgic Gait velocity:  decreased   General Gait Details: Min guard for safety, increased antalgic gait pattern towards end. Cues for upper trunk control, upward gaze   Stairs             Wheelchair Mobility    Modified Rankin (Stroke Patients Only)       Balance Overall balance assessment: Needs assistance Sitting-balance support: Feet supported Sitting balance-Leahy Scale: Good     Standing balance support: Bilateral upper extremity supported Standing balance-Leahy Scale: Fair                              Cognition Arousal/Alertness: Awake/alert Behavior During Therapy: Impulsive Overall Cognitive Status: Impaired/Different from baseline Area of Impairment: Orientation;Safety/judgement                 Orientation Level: Disoriented to;Time       Safety/Judgement: Decreased awareness of deficits;Decreased awareness of safety     General Comments: Pt oriented to self, hospital. Stating month was June, year was 2025, and that he was in [employer].      Exercises      General Comments        Pertinent Vitals/Pain Pain Assessment: Faces Faces Pain Scale: No hurt    Home Living                      Prior Function            PT Goals (current goals can now be found in the care plan section) Acute Rehab PT Goals Patient Stated Goal: get his watch PT Goal Formulation: With patient Time For Goal Achievement: 09/25/19 Potential to Achieve Goals: Good Progress  towards PT goals: Progressing toward goals    Frequency    Min 2X/week      PT Plan Current plan remains appropriate    Co-evaluation              AM-PAC PT "6 Clicks" Mobility   Outcome Measure  Help needed turning from your back to your side while in a flat bed without using bedrails?: None Help needed moving from lying on your back to sitting on the side of a flat bed without using bedrails?: A Little Help needed moving to and from a bed to a chair (including a  wheelchair)?: A Little Help needed standing up from a chair using your arms (e.g., wheelchair or bedside chair)?: A Little Help needed to walk in hospital room?: A Little Help needed climbing 3-5 steps with a railing? : A Little 6 Click Score: 19    End of Session Equipment Utilized During Treatment: Gait belt Activity Tolerance: Patient tolerated treatment well Patient left: in chair;with call bell/phone within reach;with chair alarm set Nurse Communication: Mobility status PT Visit Diagnosis: Unsteadiness on feet (R26.81);Other abnormalities of gait and mobility (R26.89);Difficulty in walking, not elsewhere classified (R26.2)     Time: [id number] PT Time Calculation (min) (ACUTE ONLY): 28 min  Charges:  $Gait Training: 8-22 mins $Therapeutic Activity: 8-22 mins                       Wyona Almas, PT, DPT Acute Rehabilitation Services Pager [phone] Office [phone]    Deno Etienne 09/13/2019, 9:10 AM

## 2019-09-13 NOTE — Care Management Important Message (Signed)
Important Message  Patient Details  Name: Lee House MRN: [id number] Date of Birth: Mar 20, 1966   Medicare Important Message Given:  Yes     Orbie Pyo 09/13/2019, 3:02 PM

## 2019-09-13 NOTE — Progress Notes (Signed)
PROGRESS NOTE    Lee House  [id number] DOB: 1965/05/25 DOA: 09/09/2019 PCP: Patient, [name] Per    Brief Narrative:  Lee House is a 54 year old Caucasian male with past medical history notable for neurogenic bladder with chronic Foley catheter, bipolar and schizoaffective disorder, COPD, recent right femur fracture status post IM nail at [facility] 5/4, who presented to the ED with confusion, fever 103.0 F likely secondary to UTI versus polypharmacy with acute urinary retention.   Assessment & Plan:   Principal Problem:   AMS (altered mental status) Active Problems:   Acute lower UTI   Schizophrenia (HCC)   COPD with acute bronchitis ([place])   Acute metabolic encephalopathy Etiology likely multifactorial with combined UTI versus polypharmacy. UDS positive for benzos. HIV nonreactive. CT head with no acute intracranial abnormalities and chest x-ray with no acute cardiopulmonary disease process. Continue treatment with antibiotics as depicted below. Discontinued Depakote and decrease olanzapine from 10 mg to 5 mg twice daily. --Mental status now seems to be back at baseline  UTI Urinalysis with negative leukoesterase, positive nitrite, many bacteria and 0-5 WBCs. Urine culture >100K DIPHTHEROIDS(CORYNEBACTERIUM SPECIES) --Transitioned cefepime to cefdinir 300 mg p.o. twice daily today  Acute urinary retention Hx Neurogenic bladder Patient with history of neurogenic bladder with chronic Foley catheter in place. Patient with abdominal distention, replace Foley catheter with immediate return of 2.7 L of urine. --Continue to monitor urine output --continue tamsulosin 0.4 mg p.o. daily  Bipolar disorder Schizoaffective disorder --Olanzapine decreased from 10 mg to 5 mg p.o. twice daily --Continue Risperdal 1 mg PO BID --cogentin 0.5mg  BID --Abilify 10 mg p.o. daily --Trazodone 50 mg p.o. nightly  Recent right femur fracture Recent ORIF with IM nail at [facility]. --PT following, recommend return to SNF   DVT prophylaxis: Lovenox Code Status: Full code Family Communication: None present at bedside  Disposition Plan:  Status is: Inpatient  Remains inpatient appropriate because:Unsafe d/c plan and IV treatments appropriate due to intensity of illness or inability to take PO   Dispo: The patient is from: SNF, [facility]              Anticipated d/c is to: SNF, [facility]              Anticipated d/c date is: 1 day              Patient currently is medically stable to d/c.   Consultants:   none  Procedures:   Replaced Foley catheter  Antimicrobials:   Cefepime 5/24 - 5/26  Cefdinir 5/26>>   Subjective: Patient seen and examined bedside, resting comfortably. No complaints this morning. Mentation appears to be at his normal baseline. Denies headache, no fever/chills/night sweats, no nausea/vomiting/diarrhea, no chest pain, palpitations, no shortness of breath, no abdominal pain, no weakness. No acute events overnight per nursing staff.  Objective: Vitals:   09/12/19 0726 09/12/19 1416 09/12/19 2011 09/13/19 0819  BP: 101/69 (!) 102/59 (!) 107/54 100/72  Pulse: 92 77 85 89  Resp: 17 15 16    Temp: 98.3 F (36.8 C) 98 F (36.7 C) 98.6 F (37 C) 97.9 F (36.6 C)  TempSrc: Oral Oral  Oral  SpO2: 95% 99% 100% 100%  Weight:      Height:        Intake/Output Summary (Last 24 hours) at 09/13/2019 0918 Last data filed at 09/13/2019 [id number] Gross per 24 hour  Intake 1440 ml  Output 4800 ml  Net -3360 ml  Filed Weights   09/09/19 1706  Weight: 88.5 kg    Examination:  General exam: Appears calm and comfortable  Respiratory system: Clear to auscultation. Respiratory effort normal. Oxygenating well on room air Cardiovascular system: S1 & S2 heard, RRR. No JVD, murmurs, rubs, gallops or clicks. No pedal edema. Gastrointestinal system: Abdomen is nondistended, soft and nontender. No organomegaly or  masses felt. Normal bowel sounds heard. Central nervous system: Alert and oriented. No focal neurological deficits. Extremities: Symmetric 5 x 5 power. Skin: No rashes, lesions or ulcers Psychiatry: Judgement and insight appear poor. Mood & affect appropriate.     Data Reviewed: I have personally reviewed following labs and imaging studies  CBC: Recent Labs  Lab 09/09/19 1634 09/10/19 0600 09/11/19 0421  WBC 17.6* 8.7 4.6  NEUTROABS 15.7*  --  3.0  HGB 11.8* 9.4* 9.8*  HCT 37.4* 30.0* 30.8*  MCV 92.8 91.7 92.5  PLT 403* 276 0000000   Basic Metabolic Panel: Recent Labs  Lab 09/09/19 1634 09/10/19 0600 09/11/19 0421  NA 133* 135 138  K 4.1 3.9 4.4  CL 97* 101 105  CO2 25 25 27   GLUCOSE 111* 96 96  BUN 20 16 15   CREATININE 1.06 0.85 0.87  CALCIUM 8.6* 8.0* 8.3*   GFR: Estimated Creatinine Clearance: 109.7 mL/min (by C-G formula based on SCr of 0.87 mg/dL). Liver Function Tests: Recent Labs  Lab 09/09/19 1634 09/10/19 0600  AST 16 13*  ALT 16 16  ALKPHOS 139* 98  BILITOT 0.7 1.0  PROT 6.5 4.8*  ALBUMIN 3.3* 2.4*   No results for input(s): LIPASE, AMYLASE in the last 168 hours. No results for input(s): AMMONIA in the last 168 hours. Coagulation Profile: Recent Labs  Lab 09/09/19 1634  INR 1.2   Cardiac Enzymes: No results for input(s): CKTOTAL, CKMB, CKMBINDEX, TROPONINI in the last 168 hours. BNP (last 3 results) No results for input(s): PROBNP in the last 8760 hours. HbA1C: No results for input(s): HGBA1C in the last 72 hours. CBG: No results for input(s): GLUCAP in the last 168 hours. Lipid Profile: No results for input(s): CHOL, HDL, LDLCALC, TRIG, CHOLHDL, LDLDIRECT in the last 72 hours. Thyroid Function Tests: No results for input(s): TSH, T4TOTAL, FREET4, T3FREE, THYROIDAB in the last 72 hours. Anemia Panel: No results for input(s): VITAMINB12, FOLATE, FERRITIN, TIBC, IRON, RETICCTPCT in the last 72 hours. Sepsis Labs: Recent Labs  Lab  09/09/19 1634  LATICACIDVEN 1.2    Recent Results (from the past 240 hour(s))  Blood Culture (routine x 2)     Status: None (Preliminary result)   Collection Time: 09/09/19  4:35 PM   Specimen: BLOOD LEFT FOREARM  Result Value Ref Range Status   Specimen Description BLOOD LEFT FOREARM  Final   Special Requests   Final    BOTTLES DRAWN AEROBIC AND ANAEROBIC Blood Culture adequate volume   Culture   Final    NO GROWTH 4 DAYS Performed at [facility], [place] [address], [place], [place] [address]    Report Status PENDING  Incomplete  Blood Culture (routine x 2)     Status: None (Preliminary result)   Collection Time: 09/09/19  4:36 PM   Specimen: BLOOD  Result Value Ref Range Status   Specimen Description BLOOD LEFT ANTECUBITAL  Final   Special Requests   Final    BOTTLES DRAWN AEROBIC AND ANAEROBIC Blood Culture results may not be optimal due to an inadequate volume of blood received in culture bottles  Culture   Final    NO GROWTH 4 DAYS Performed at [facility], [place] [address], [place], [place] [address]    Report Status PENDING  Incomplete  Urine culture     Status: Abnormal   Collection Time: 09/09/19  5:01 PM   Specimen: In/Out Cath Urine  Result Value Ref Range Status   Specimen Description IN/OUT CATH URINE  Final   Special Requests NONE  Final   Culture (A)  Final    >=100,000 COLONIES/mL DIPHTHEROIDS(CORYNEBACTERIUM SPECIES) Standardized susceptibility testing for this organism is not available. Performed at [facility], [place] [address], [place], [place] [address]    Report Status 09/10/2019 FINAL  Final  SARS Coronavirus 2 by RT PCR (hospital order, performed in [facility] hospital lab) Nasopharyngeal Urine, Catheterized     Status: None   Collection Time: 09/09/19  5:01 PM   Specimen: Urine, Catheterized; Nasopharyngeal  Result Value Ref Range Status   SARS Coronavirus 2 NEGATIVE NEGATIVE Final    Comment: (NOTE) SARS-CoV-2 target nucleic  acids are NOT DETECTED. The SARS-CoV-2 RNA is generally detectable in upper and lower respiratory specimens during the acute phase of infection. The lowest concentration of SARS-CoV-2 viral copies this assay can detect is 250 copies / mL. A negative result does not preclude SARS-CoV-2 infection and should not be used as the sole basis for treatment or other patient management decisions.  A negative result may occur with improper specimen collection / handling, submission of specimen other than nasopharyngeal swab, presence of viral mutation(s) within the areas targeted by this assay, and inadequate number of viral copies (<250 copies / mL). A negative result must be combined with clinical observations, patient history, and epidemiological information. Fact Sheet for Patients:   StrictlyIdeas.no Fact Sheet for Healthcare Providers: BankingDealers.co.za This test is not yet approved or cleared  by the [place] FDA and has been authorized for detection and/or diagnosis of SARS-CoV-2 by FDA under an Emergency Use Authorization (EUA).  This EUA will remain in effect (meaning this test can be used) for the duration of the COVID-19 declaration under Section 564(b)(1) of the Act, 21 U.S.C. section 360bbb-3(b)(1), unless the authorization is terminated or revoked sooner. Performed at [facility], [place] [address] [place], [place] [address]   SARS CORONAVIRUS 2 (TAT 6-24 HRS) Nasopharyngeal Nasopharyngeal Swab     Status: None   Collection Time: 09/11/19  9:41 AM   Specimen: Nasopharyngeal Swab  Result Value Ref Range Status   SARS Coronavirus 2 NEGATIVE NEGATIVE Final    Comment: (NOTE) SARS-CoV-2 target nucleic acids are NOT DETECTED. The SARS-CoV-2 RNA is generally detectable in upper and lower respiratory specimens during the acute phase of infection. Negative results do not preclude SARS-CoV-2 infection, do not rule  out co-infections with other pathogens, and should not be used as the sole basis for treatment or other patient management decisions. Negative results must be combined with clinical observations, patient history, and epidemiological information. The expected result is Negative. Fact Sheet for Patients: SugarRoll.be Fact Sheet for Healthcare Providers: https://www.woods-mathews.com/ This test is not yet approved or cleared by the [place] FDA and  has been authorized for detection and/or diagnosis of SARS-CoV-2 by FDA under an Emergency Use Authorization (EUA). This EUA will remain  in effect (meaning this test can be used) for the duration of the COVID-19 declaration under Section 56 4(b)(1) of the Act, 21 U.S.C. section 360bbb-3(b)(1), unless the authorization is terminated or revoked sooner.  Performed at [facility], [place] [address] [place], [place] [address]          Radiology Studies: No results found.      Scheduled Meds: . ARIPiprazole  10 mg Oral Daily  . benztropine  0.5 mg Oral BID  . cefdinir  300 mg Oral Q12H  . Chlorhexidine Gluconate Cloth  6 each Topical Daily  . enoxaparin (LOVENOX) injection  40 mg Subcutaneous Q24H  . OLANZapine  5 mg Oral BID  . risperidone  1 mg Oral BID  . tamsulosin  0.4 mg Oral Daily   Continuous Infusions:    LOS: 4 days    Time spent: 36 minutes spent on chart review, discussion with nursing staff, consultants, updating family and interview/physical exam; more than 50% of that time was spent in counseling and/or coordination of care.    Jemarcus Dougal J [place] DO [facility] Available via Epic secure chat 7am-7pm After these hours, please refer to coverage provider listed on [email or web address] 09/13/2019, 9:18 AM

## 2019-09-14 LAB — CULTURE, BLOOD (ROUTINE X 2)
Culture: NO GROWTH
Culture: NO GROWTH
Special Requests: ADEQUATE

## 2019-09-28 ENCOUNTER — Encounter (HOSPITAL_COMMUNITY): Payer: Self-pay

## 2019-09-28 ENCOUNTER — Encounter (HOSPITAL_COMMUNITY): Payer: Self-pay | Admitting: Emergency Medicine

## 2019-09-28 ENCOUNTER — Emergency Department (HOSPITAL_COMMUNITY)
Admission: EM | Admit: 2019-09-28 | Discharge: 2019-09-28 | Disposition: A | Discharge status: Home / Self Care | Payer: 59 | Source: Home / Self Care | Attending: Emergency Medicine | Admitting: Emergency Medicine

## 2019-09-28 ENCOUNTER — Emergency Department (HOSPITAL_COMMUNITY)
Admission: EM | Admit: 2019-09-28 | Discharge: 2019-09-29 | Disposition: A | Discharge status: Home / Self Care | Payer: 59 | Source: Home / Self Care

## 2019-09-28 ENCOUNTER — Other Ambulatory Visit: Payer: Self-pay

## 2019-09-28 DIAGNOSIS — A419 Sepsis, unspecified organism: Secondary | ICD-10-CM | POA: Diagnosis not present

## 2019-09-28 DIAGNOSIS — Z87891 Personal history of nicotine dependence: Secondary | ICD-10-CM | POA: Insufficient documentation

## 2019-09-28 DIAGNOSIS — T83518A Infection and inflammatory reaction due to other urinary catheter, initial encounter: Secondary | ICD-10-CM | POA: Diagnosis not present

## 2019-09-28 DIAGNOSIS — T83028A Displacement of other indwelling urethral catheter, initial encounter: Secondary | ICD-10-CM | POA: Insufficient documentation

## 2019-09-28 DIAGNOSIS — N319 Neuromuscular dysfunction of bladder, unspecified: Secondary | ICD-10-CM | POA: Insufficient documentation

## 2019-09-28 DIAGNOSIS — J449 Chronic obstructive pulmonary disease, unspecified: Secondary | ICD-10-CM | POA: Insufficient documentation

## 2019-09-28 DIAGNOSIS — Z7689 Persons encountering health services in other specified circumstances: Secondary | ICD-10-CM

## 2019-09-28 DIAGNOSIS — Z7901 Long term (current) use of anticoagulants: Secondary | ICD-10-CM | POA: Insufficient documentation

## 2019-09-28 DIAGNOSIS — J45909 Unspecified asthma, uncomplicated: Secondary | ICD-10-CM | POA: Insufficient documentation

## 2019-09-28 DIAGNOSIS — Z79899 Other long term (current) drug therapy: Secondary | ICD-10-CM | POA: Insufficient documentation

## 2019-09-28 NOTE — ED Triage Notes (Signed)
Pt presents via [facility] from [facility] after removing foley catheter. Pt has hx of schizophrenia and paranoid tendencies. EMS reports pt pulled foley catheter appx 1 hr prior to arrival.

## 2019-09-28 NOTE — ED Triage Notes (Addendum)
Pt facility refused patient. They states that patient must have a foley catheter. He refuses a foley catheter and has no legal guardian and is not IVC'd. Patient urinated on his own in a urinal upon arrival to the ED.   AC has been made aware of situation d/t facility refusal of allowing this patient in his home. Spoke with Wallene Dales from the facility. She states that they have no supervisor for [place] to talk to.

## 2019-09-28 NOTE — ED Notes (Signed)
Report attempted to be called to [facility] and was advised by [facility] that pt could not return without catheter. However pt is adamantly refusing to have catheter placed. Dr.Zackowski was notified and stated that pt can not be forced to have catheter placed. [facility] called regarding situation.

## 2019-09-28 NOTE — ED Notes (Signed)
Reassessment of pt found that pt had not been able to urinate into urinal and blood was noted in urinal from previous trauma of catheter being removed by pt earlier. Dr.Zackowski notified.

## 2019-09-28 NOTE — Discharge Instructions (Addendum)
Patient refuses Foley catheter.  He claims he does not need it.  Long discussion with him letting him know his get a neurogenic bladder.  And most likely has bladder will become overdistended and he will be back.  Patient is adamant about not having the Foley catheter.  Discussed with personnel at [facility].  Will discharge patient back to nursing facility.

## 2019-09-28 NOTE — ED Notes (Signed)
Pt given sandwich and assisted into adult brief and paper scrub pants. Pt awaiting transport.

## 2019-09-28 NOTE — ED Notes (Signed)
Pt pacing around room still refusing to have catheter placed. Dr.Zackowski aware.

## 2019-09-28 NOTE — ED Notes (Signed)
[facility] [employer] notified of need for transport of pt back to residence.  

## 2019-09-28 NOTE — ED Notes (Signed)
Pt states that he does not want to have another catheter placed and can use a urinal. Pt given a urinal and privacy. Will reassess output in urinal.

## 2019-09-28 NOTE — ED Provider Notes (Signed)
[facility] DEPT Provider Note   CSN: [id number] Arrival date & time: 09/28/19  1911     History Chief Complaint  Patient presents with  . Removed Foley Catheter    Lee House is a 54 y.o. male.  Patient sent in from [facility].  Where patient is there for physical therapy following a broken femur that was nailed repaired.  Patient apparently has a history of neurogenic bladder and as had Foley catheter long-term.  Patient pulled it out today approximately 1 hour prior to arrival.  He is stating he does not need it.  We had a long discussion about that that his bladder may become over distended and he have to come back and he is convinced that he will be able to pee.  Patient still confused refusing the Foley catheter.  Past medical history significant for schizophrenia and paranoid tendencies.  But patient seems to be fairly lucid.        Past Medical History:  Diagnosis Date  . Asthma   . Bipolar 1 disorder ([place])   . COPD (chronic obstructive pulmonary disease) ([place])   . GERD (gastroesophageal reflux disease)   . Schizo affective schizophrenia [facility])     Patient Active Problem List   Diagnosis Date Noted  . AMS (altered mental status) 09/09/2019  . Acute lower UTI 09/09/2019  . Schizophrenia ([place]) 09/09/2019  . COPD with acute bronchitis ([place]) 09/09/2019  . Distal radial fracture 02/27/2012  . Anemia 06/02/2011  . Cellulitis and abscess of leg 06/01/2011  . Altered mental status 06/01/2011  . Thrombocytopenia ([place]) 06/01/2011    Past Surgical History:  Procedure Laterality Date  . CARPAL TUNNEL RELEASE  02/27/2012   Procedure: CARPAL TUNNEL RELEASE;  Surgeon: Schuyler Amor, MD;  Location: [place];  Service: Orthopedics;  Laterality: Left;  . OPEN REDUCTION INTERNAL FIXATION (ORIF) DISTAL RADIAL FRACTURE  02/27/2012   Procedure: OPEN REDUCTION INTERNAL FIXATION (ORIF) DISTAL RADIAL FRACTURE;   Surgeon: Schuyler Amor, MD;  Location: [place];  Service: Orthopedics;  Laterality: Left;  Open reduction internal fixation of  left distal radius fracture.        History reviewed. No pertinent family history.  Social History   Tobacco Use  . Smoking status: Former Research scientist (life sciences)  . Smokeless tobacco: Never Used  Substance Use Topics  . Alcohol use: No  . Drug use: No    Home Medications Prior to Admission medications   Medication Sig Start Date End Date Taking? Authorizing Provider  albuterol (PROVENTIL HFA;VENTOLIN HFA) 108 (90 BASE) MCG/ACT inhaler Inhale 2 puffs into the lungs every 4 (four) hours as needed. As needed for shortness of breath.    [provider]  ALPRAZolam Duanne Moron) 0.25 MG tablet Take 1 tablet (0.25 mg total) by mouth 2 (two) times daily as needed for anxiety. 09/12/19   [place] Eric J, DO  ARIPiprazole (ABILIFY) 10 MG tablet Take 5 mg by mouth in the morning and at bedtime.    [provider]  aspirin EC 325 MG tablet Take 325 mg by mouth in the morning and at bedtime. 08/23/19   [provider]  benztropine (COGENTIN) 0.5 MG tablet Take 0.5 mg by mouth 2 (two) times daily.    [provider]  calcium-vitamin D (CALCIUM 500+D HIGH POTENCY) 500-400 MG-UNIT tablet Take 1 tablet by mouth daily.    [provider]  divalproex (DEPAKOTE) 125 MG DR tablet Take 125 mg by mouth 3 (three)  times daily.    [provider]  enoxaparin (LOVENOX) 40 MG/0.4ML injection Inject 40 mg into the skin daily. X 14 days 08/25/19   [provider]  ferrous sulfate 325 (65 FE) MG tablet Take 325 mg by mouth in the morning and at bedtime. 08/23/19 09/22/19  [provider]  Fluticasone-Salmeterol (ADVAIR) 250-50 MCG/DOSE AEPB Inhale 1 puff into the lungs every 12 (twelve) hours.    [provider]  meloxicam (MOBIC) 7.5 MG tablet Take 7.5 mg by mouth daily.    [provider]  Multiple Vitamin (MULTI-VITAMIN)  tablet Take 1 tablet by mouth daily.    [provider]  OLANZapine (ZYPREXA) 10 MG tablet Take 10 mg by mouth 2 (two) times daily.     [provider]  oxyCODONE (OXY IR/ROXICODONE) 5 MG immediate release tablet Take 1 tablet (5 mg total) by mouth every 6 (six) hours as needed for moderate pain. 09/12/19 10/12/19  [place] Donnamarie Poag, DO  risperidone (RISPERDAL) 4 MG tablet Take 1 mg by mouth 2 (two) times daily.     [provider]  senna-docusate (SENOKOT-S) 8.6-50 MG tablet Take 1 tablet by mouth 2 (two) times daily as needed for constipation. 08/23/19   [provider]  traZODone (DESYREL) 50 MG tablet Take 50 mg by mouth at bedtime as needed for sleep.     [provider]    Allergies    Penicillins  Review of Systems   Review of Systems  Constitutional: Negative for chills and fever.  HENT: Negative for congestion, rhinorrhea and sore throat.   Eyes: Negative for visual disturbance.  Respiratory: Negative for cough and shortness of breath.   Cardiovascular: Negative for chest pain and leg swelling.  Gastrointestinal: Negative for abdominal pain, diarrhea, nausea and vomiting.  Genitourinary: Positive for difficulty urinating. Negative for dysuria.  Musculoskeletal: Negative for back pain and neck pain.  Skin: Negative for rash.  Neurological: Negative for dizziness, light-headedness and headaches.  Hematological: Does not bruise/bleed easily.  Psychiatric/Behavioral: Negative for confusion.    Physical Exam Updated Vital Signs BP 108/69 (BP Location: Left Arm)   Pulse 82   Temp 98.4 F (36.9 C) (Oral)   Resp 17   Ht 1.854 m (6\' 1" )   Wt 89 kg   SpO2 100%   BMI 25.89 kg/m   Physical Exam Vitals and nursing note reviewed.  Constitutional:      Appearance: Normal appearance. He is well-developed.  HENT:     Head: Normocephalic and atraumatic.  Eyes:     Extraocular Movements: Extraocular movements intact.     Conjunctiva/sclera:  Conjunctivae normal.     Pupils: Pupils are equal, round, and reactive to light.  Cardiovascular:     Rate and Rhythm: Normal rate and regular rhythm.     Heart sounds: No murmur heard.   Pulmonary:     Effort: Pulmonary effort is normal. No respiratory distress.     Breath sounds: Normal breath sounds.  Abdominal:     Palpations: Abdomen is soft.     Tenderness: There is no abdominal tenderness.  Genitourinary:    Penis: Normal.   Musculoskeletal:        General: Normal range of motion.     Cervical back: Normal range of motion and neck supple.  Skin:    General: Skin is warm and dry.  Neurological:     General: No focal deficit present.     Mental Status: He is alert. Mental status  is at baseline.     ED Results / Procedures / Treatments   Labs (all labs ordered are listed, but only abnormal results are displayed) Labs Reviewed - No data to display  EKG None  Radiology No results found.  Procedures Procedures (including critical care time)  Medications Ordered in ED Medications - No data to display  ED Course  I have reviewed the triage vital signs and the nursing notes.  Pertinent labs & imaging results that were available during my care of the patient were reviewed by me and considered in my medical decision making (see chart for details).    MDM Rules/Calculators/A&P                          Patient is absolutely refusing Foley catheter.  We tried to convince him otherwise.  He is adamant about it.  We contacted [facility] facility.  They understand.  And patient will be discharged back.  Patient will need to return if he is unable to void.  No evidence of any trauma of any significance from the Foley catheter being pulled out.   Final Clinical Impression(s) / ED Diagnoses Final diagnoses:  Encounter for evaluation of Foley catheter    Rx / DC Orders ED Discharge Orders    None       Fredia Sorrow, MD 09/28/19 2132

## 2019-09-29 ENCOUNTER — Observation Stay (HOSPITAL_COMMUNITY): Payer: 59

## 2019-09-29 ENCOUNTER — Other Ambulatory Visit: Payer: Self-pay

## 2019-09-29 ENCOUNTER — Emergency Department (HOSPITAL_COMMUNITY): Payer: 59

## 2019-09-29 ENCOUNTER — Inpatient Hospital Stay (HOSPITAL_COMMUNITY)
Admission: AD | Admit: 2019-09-29 | Discharge: 2019-10-14 | DRG: 698 | Disposition: A | Discharge status: Skilled Nursing Facility | Payer: 59 | Attending: Internal Medicine | Admitting: Internal Medicine

## 2019-09-29 ENCOUNTER — Encounter (HOSPITAL_COMMUNITY): Payer: Self-pay

## 2019-09-29 DIAGNOSIS — Z87891 Personal history of nicotine dependence: Secondary | ICD-10-CM

## 2019-09-29 DIAGNOSIS — Z87892 Personal history of anaphylaxis: Secondary | ICD-10-CM

## 2019-09-29 DIAGNOSIS — N390 Urinary tract infection, site not specified: Secondary | ICD-10-CM | POA: Diagnosis not present

## 2019-09-29 DIAGNOSIS — E876 Hypokalemia: Secondary | ICD-10-CM | POA: Diagnosis present

## 2019-09-29 DIAGNOSIS — Y846 Urinary catheterization as the cause of abnormal reaction of the patient, or of later complication, without mention of misadventure at the time of the procedure: Secondary | ICD-10-CM | POA: Diagnosis present

## 2019-09-29 DIAGNOSIS — N319 Neuromuscular dysfunction of bladder, unspecified: Secondary | ICD-10-CM | POA: Diagnosis not present

## 2019-09-29 DIAGNOSIS — J449 Chronic obstructive pulmonary disease, unspecified: Secondary | ICD-10-CM | POA: Diagnosis present

## 2019-09-29 DIAGNOSIS — A419 Sepsis, unspecified organism: Secondary | ICD-10-CM | POA: Diagnosis present

## 2019-09-29 DIAGNOSIS — Z880 Allergy status to penicillin: Secondary | ICD-10-CM

## 2019-09-29 DIAGNOSIS — Z79899 Other long term (current) drug therapy: Secondary | ICD-10-CM

## 2019-09-29 DIAGNOSIS — G9341 Metabolic encephalopathy: Secondary | ICD-10-CM | POA: Diagnosis present

## 2019-09-29 DIAGNOSIS — F200 Paranoid schizophrenia: Secondary | ICD-10-CM | POA: Diagnosis present

## 2019-09-29 DIAGNOSIS — Z7982 Long term (current) use of aspirin: Secondary | ICD-10-CM

## 2019-09-29 DIAGNOSIS — T83518A Infection and inflammatory reaction due to other urinary catheter, initial encounter: Principal | ICD-10-CM | POA: Diagnosis present

## 2019-09-29 DIAGNOSIS — Z20822 Contact with and (suspected) exposure to covid-19: Secondary | ICD-10-CM | POA: Diagnosis present

## 2019-09-29 DIAGNOSIS — F319 Bipolar disorder, unspecified: Secondary | ICD-10-CM | POA: Diagnosis present

## 2019-09-29 DIAGNOSIS — T83511A Infection and inflammatory reaction due to indwelling urethral catheter, initial encounter: Secondary | ICD-10-CM | POA: Diagnosis not present

## 2019-09-29 DIAGNOSIS — F209 Schizophrenia, unspecified: Secondary | ICD-10-CM | POA: Diagnosis not present

## 2019-09-29 DIAGNOSIS — D509 Iron deficiency anemia, unspecified: Secondary | ICD-10-CM | POA: Diagnosis present

## 2019-09-29 DIAGNOSIS — B9689 Other specified bacterial agents as the cause of diseases classified elsewhere: Secondary | ICD-10-CM | POA: Diagnosis present

## 2019-09-29 DIAGNOSIS — T360X5A Adverse effect of penicillins, initial encounter: Secondary | ICD-10-CM | POA: Diagnosis not present

## 2019-09-29 DIAGNOSIS — B965 Pseudomonas (aeruginosa) (mallei) (pseudomallei) as the cause of diseases classified elsewhere: Secondary | ICD-10-CM | POA: Diagnosis present

## 2019-09-29 DIAGNOSIS — N179 Acute kidney failure, unspecified: Secondary | ICD-10-CM | POA: Diagnosis present

## 2019-09-29 DIAGNOSIS — R6521 Severe sepsis with septic shock: Secondary | ICD-10-CM | POA: Diagnosis present

## 2019-09-29 LAB — CBC
HCT: 40 % (ref 39.0–52.0)
Hemoglobin: 12.4 g/dL — ABNORMAL LOW (ref 13.0–17.0)
MCH: 27.8 pg (ref 26.0–34.0)
MCHC: 31 g/dL (ref 30.0–36.0)
MCV: 89.7 fL (ref 80.0–100.0)
Platelets: 278 10*3/uL (ref 150–400)
RBC: 4.46 MIL/uL (ref 4.22–5.81)
RDW: 14.1 % (ref 11.5–15.5)
WBC: 10.9 10*3/uL — ABNORMAL HIGH (ref 4.0–10.5)
nRBC: 0 % (ref 0.0–0.2)

## 2019-09-29 LAB — URINALYSIS, ROUTINE W REFLEX MICROSCOPIC
Bilirubin Urine: NEGATIVE
Glucose, UA: NEGATIVE mg/dL
Ketones, ur: NEGATIVE mg/dL
Nitrite: POSITIVE — AB
Protein, ur: >300 mg/dL — AB
Specific Gravity, Urine: 1.02 (ref 1.005–1.030)
pH: 7 (ref 5.0–8.0)

## 2019-09-29 LAB — COMPREHENSIVE METABOLIC PANEL
ALT: 22 U/L (ref 0–44)
AST: 22 U/L (ref 15–41)
Albumin: 3.3 g/dL — ABNORMAL LOW (ref 3.5–5.0)
Alkaline Phosphatase: 114 U/L (ref 38–126)
Anion gap: 10 (ref 5–15)
BUN: 22 mg/dL — ABNORMAL HIGH (ref 6–20)
CO2: 26 mmol/L (ref 22–32)
Calcium: 9 mg/dL (ref 8.9–10.3)
Chloride: 99 mmol/L (ref 98–111)
Creatinine, Ser: 1.16 mg/dL (ref 0.61–1.24)
GFR calc Af Amer: >60 mL/min (ref >60)
GFR calc non Af Amer: >60 mL/min (ref >60)
Glucose, Bld: 103 mg/dL — ABNORMAL HIGH (ref 70–99)
Potassium: 4.1 mmol/L (ref 3.5–5.1)
Sodium: 135 mmol/L (ref 135–145)
Total Bilirubin: 0.7 mg/dL (ref 0.3–1.2)
Total Protein: 6.7 g/dL (ref 6.5–8.1)

## 2019-09-29 LAB — URINALYSIS, MICROSCOPIC (REFLEX)
RBC / HPF: >50 RBC/hpf (ref 0–5)
Squamous Epithelial / HPF: NONE SEEN (ref 0–5)
WBC, UA: >50 WBC/hpf (ref 0–5)

## 2019-09-29 LAB — SARS CORONAVIRUS 2 BY RT PCR (HOSPITAL ORDER, PERFORMED IN ~~LOC~~ HOSPITAL LAB): SARS Coronavirus 2: NEGATIVE

## 2019-09-29 MED ORDER — ONDANSETRON HCL 4 MG PO TABS: 4.0000 mg | ORAL_TABLET | Freq: Four times a day (QID) | ORAL | Status: DC | PRN

## 2019-09-29 MED ORDER — SENNOSIDES-DOCUSATE SODIUM 8.6-50 MG PO TABS: 1.0000 | ORAL_TABLET | Freq: Two times a day (BID) | ORAL | Status: DC | PRN

## 2019-09-29 MED ORDER — ALBUTEROL SULFATE (2.5 MG/3ML) 0.083% IN NEBU: 3.0000 mL | INHALATION_SOLUTION | RESPIRATORY_TRACT | Status: DC | PRN

## 2019-09-29 MED ORDER — ONDANSETRON HCL 4 MG/2ML IJ SOLN: 4.0000 mg | Freq: Four times a day (QID) | INTRAMUSCULAR | Status: DC | PRN

## 2019-09-29 MED ORDER — HALOPERIDOL LACTATE 5 MG/ML IJ SOLN
2.0000 mg | Freq: Once | INTRAMUSCULAR | Status: AC
  Administered 2019-09-29: 2 mg via INTRAVENOUS
  Filled 2019-09-29: qty 1

## 2019-09-29 MED ORDER — SODIUM CHLORIDE 0.9 % IV SOLN
2.0000 g | Freq: Two times a day (BID) | INTRAVENOUS | Status: DC
  Administered 2019-09-30 – 2019-10-01 (×3): 2 g via INTRAVENOUS
  Filled 2019-09-29 (×5): qty 2

## 2019-09-29 MED ORDER — CALCIUM CARB-CHOLECALCIFEROL 500-400 MG-UNIT PO TABS: ORAL_TABLET | Freq: Every day | ORAL | Status: DC

## 2019-09-29 MED ORDER — SODIUM CHLORIDE 0.9 % IV BOLUS
1000.0000 mL | Freq: Once | INTRAVENOUS | Status: AC
  Administered 2019-09-29 (×2): 1000 mL via INTRAVENOUS

## 2019-09-29 MED ORDER — ARIPIPRAZOLE 5 MG PO TABS
10.0000 mg | ORAL_TABLET | Freq: Two times a day (BID) | ORAL | Status: DC
  Administered 2019-09-29 – 2019-10-14 (×30): 10 mg via ORAL
  Filled 2019-09-29 (×6): qty 2
  Filled 2019-09-29: qty 1
  Filled 2019-09-29 (×5): qty 2
  Filled 2019-09-29: qty 1
  Filled 2019-09-29 (×4): qty 2
  Filled 2019-09-29 (×2): qty 1
  Filled 2019-09-29 (×8): qty 2
  Filled 2019-09-29: qty 1
  Filled 2019-09-29 (×4): qty 2

## 2019-09-29 MED ORDER — SODIUM CHLORIDE 0.9 % IV SOLN
2.0000 g | Freq: Once | INTRAVENOUS | Status: AC
  Administered 2019-09-29: 2 g via INTRAVENOUS
  Filled 2019-09-29: qty 2

## 2019-09-29 MED ORDER — OXYCODONE HCL 5 MG PO TABS: 5.0000 mg | ORAL_TABLET | Freq: Four times a day (QID) | ORAL | Status: DC | PRN

## 2019-09-29 MED ORDER — LACTATED RINGERS IV SOLN: INTRAVENOUS | Status: DC

## 2019-09-29 MED ORDER — CEROVITE SENIOR PO TABS: 1.0000 | ORAL_TABLET | Freq: Every day | ORAL | Status: DC

## 2019-09-29 MED ORDER — SODIUM CHLORIDE 0.9 % IV SOLN
2.0000 g | Freq: Once | INTRAVENOUS | Status: AC
  Administered 2019-09-29: 2 g via INTRAVENOUS
  Filled 2019-09-29: qty 20

## 2019-09-29 MED ORDER — ASPIRIN EC 81 MG PO TBEC
81.0000 mg | DELAYED_RELEASE_TABLET | Freq: Two times a day (BID) | ORAL | Status: DC
  Administered 2019-09-29 – 2019-10-14 (×30): 81 mg via ORAL
  Filled 2019-09-29 (×30): qty 1

## 2019-09-29 MED ORDER — DIVALPROEX SODIUM 125 MG PO DR TAB
125.0000 mg | DELAYED_RELEASE_TABLET | Freq: Three times a day (TID) | ORAL | Status: DC
  Administered 2019-09-29 – 2019-10-14 (×45): 125 mg via ORAL
  Filled 2019-09-29 (×48): qty 1

## 2019-09-29 MED ORDER — PANTOPRAZOLE SODIUM 40 MG PO TBEC
40.0000 mg | DELAYED_RELEASE_TABLET | Freq: Every day | ORAL | Status: DC
  Administered 2019-09-30 – 2019-10-14 (×15): 40 mg via ORAL
  Filled 2019-09-29 (×15): qty 1

## 2019-09-29 MED ORDER — CALCIUM CARBONATE-VITAMIN D 500-200 MG-UNIT PO TABS
1.0000 | ORAL_TABLET | Freq: Every day | ORAL | Status: DC
  Administered 2019-09-30 – 2019-10-14 (×15): 1 via ORAL
  Filled 2019-09-29 (×17): qty 1

## 2019-09-29 MED ORDER — SODIUM CHLORIDE 0.9 % IV SOLN: 2.0000 g | Freq: Once | INTRAVENOUS | Status: DC

## 2019-09-29 MED ORDER — FERROUS SULFATE 325 (65 FE) MG PO TABS
325.0000 mg | ORAL_TABLET | Freq: Two times a day (BID) | ORAL | Status: DC
  Administered 2019-09-30 – 2019-10-14 (×30): 325 mg via ORAL
  Filled 2019-09-29 (×30): qty 1

## 2019-09-29 MED ORDER — ACETAMINOPHEN 650 MG RE SUPP
650.0000 mg | Freq: Four times a day (QID) | RECTAL | Status: DC | PRN
  Administered 2019-09-29: 650 mg via RECTAL
  Filled 2019-09-29: qty 1

## 2019-09-29 MED ORDER — TRAZODONE HCL 50 MG PO TABS
50.0000 mg | ORAL_TABLET | Freq: Every evening | ORAL | Status: DC | PRN
  Administered 2019-10-11: 50 mg via ORAL
  Filled 2019-09-29 (×2): qty 1

## 2019-09-29 MED ORDER — MELOXICAM 7.5 MG PO TABS
7.5000 mg | ORAL_TABLET | Freq: Every day | ORAL | Status: DC
  Administered 2019-09-30 – 2019-10-14 (×15): 7.5 mg via ORAL
  Filled 2019-09-29 (×15): qty 1

## 2019-09-29 MED ORDER — BENZTROPINE MESYLATE 0.5 MG PO TABS
0.5000 mg | ORAL_TABLET | Freq: Two times a day (BID) | ORAL | Status: DC
  Administered 2019-09-29 – 2019-10-14 (×30): 0.5 mg via ORAL
  Filled 2019-09-29 (×31): qty 1

## 2019-09-29 MED ORDER — MOMETASONE FURO-FORMOTEROL FUM 200-5 MCG/ACT IN AERO
2.0000 | INHALATION_SPRAY | Freq: Two times a day (BID) | RESPIRATORY_TRACT | Status: DC
  Administered 2019-09-30 – 2019-10-14 (×28): 2 via RESPIRATORY_TRACT
  Filled 2019-09-29: qty 8.8

## 2019-09-29 MED ORDER — TAMSULOSIN HCL 0.4 MG PO CAPS: 0.4000 mg | ORAL_CAPSULE | Freq: Every day | ORAL | Status: DC

## 2019-09-29 MED ORDER — SODIUM CHLORIDE 0.9 % IV BOLUS
1000.0000 mL | Freq: Once | INTRAVENOUS | Status: AC
  Administered 2019-09-29: 1000 mL via INTRAVENOUS

## 2019-09-29 MED ORDER — ALPRAZOLAM 0.25 MG PO TABS: 0.2500 mg | ORAL_TABLET | Freq: Two times a day (BID) | ORAL | Status: DC | PRN

## 2019-09-29 MED ORDER — ADULT MULTIVITAMIN W/MINERALS CH
1.0000 | ORAL_TABLET | Freq: Every day | ORAL | Status: DC
  Administered 2019-09-30 – 2019-10-14 (×15): 1 via ORAL
  Filled 2019-09-29 (×15): qty 1

## 2019-09-29 MED ORDER — RISPERIDONE 2 MG PO TABS
4.0000 mg | ORAL_TABLET | Freq: Two times a day (BID) | ORAL | Status: DC
  Administered 2019-09-29 – 2019-10-09 (×20): 4 mg via ORAL
  Filled 2019-09-29 (×16): qty 2
  Filled 2019-09-29: qty 8
  Filled 2019-09-29 (×2): qty 2
  Filled 2019-09-29: qty 8
  Filled 2019-09-29: qty 2
  Filled 2019-09-29: qty 8
  Filled 2019-09-29 (×2): qty 2

## 2019-09-29 MED ORDER — OLANZAPINE 5 MG PO TABS
10.0000 mg | ORAL_TABLET | Freq: Two times a day (BID) | ORAL | Status: DC
  Administered 2019-09-29 – 2019-10-14 (×30): 10 mg via ORAL
  Filled 2019-09-29 (×9): qty 2
  Filled 2019-09-29: qty 1
  Filled 2019-09-29: qty 2
  Filled 2019-09-29: qty 1
  Filled 2019-09-29: qty 2
  Filled 2019-09-29: qty 1
  Filled 2019-09-29 (×2): qty 2
  Filled 2019-09-29: qty 1
  Filled 2019-09-29 (×5): qty 2
  Filled 2019-09-29: qty 1
  Filled 2019-09-29 (×8): qty 2

## 2019-09-29 MED ORDER — ACETAMINOPHEN 325 MG PO TABS: 650.0000 mg | ORAL_TABLET | Freq: Four times a day (QID) | ORAL | Status: DC | PRN

## 2019-09-29 NOTE — ED Notes (Signed)
Pt attempted to use urinal but had issue and accidentally peed on himself. Pt became extremely agitated and upset about the matter and started ripping everything off. This RN and a tech went in cleaned pt up and got him back into the bed. [name] urine had a significant amount of blood in it and the pt's penis is actively dripping blood. MD notified, will continue to monitor.

## 2019-09-29 NOTE — H&P (Signed)
History and Physical    Lee House HAL:[id number] DOB: October 15, 1965 DOA: 09/29/2019  PCP: Patient, [name] Per  Patient coming from: [facility]  I have personally briefly reviewed patient's old medical records in [place]  Chief Complaint: UTI, foley need?  HPI: Lee House is a 54 y.o. male with medical history significant of schizoaffective disorder, BPD1, neurogenic bladder.  Pt previously had chronic foley catheter however has h/o UTIs and pulling foley catheter out.  Most recently just last month.  On 5/24 pt apparently had foley pulled out, was admitted with AMS and UTI.  Some bloody urine output until new foley placed, then had 2700cc UOP.  UCx at that time grew out corynebacterium (although worth noting that his UA was nitrite positive).  Yesterday pt apparently pulled foley out, sent to ED.  EDP evaluated pt, pt refusing to have foley put back in, EDP felt pt did have decision making capacity, sent pt back to SNF.  Today pt sent back to ED from facility today, no report from facility.  Facility wants him to have a Foley catheter apparently.   ED Course: Large LE, nitrite positive > 50 WBC, > 50 rbc on UA.  Tm 100.1, WBC 10.9k, HR 101, initial BP 88/56, improved to 128/77 with IVF bolus.  Getting 2L bolus and rocephin in ED.  Pt refusing to have foley put back in.  Although, bladder scan only showed 241cc today...   Review of Systems: As per HPI, otherwise all review of systems negative.  Past Medical History:  Diagnosis Date  . Asthma   . Bipolar 1 disorder ([place])   . COPD (chronic obstructive pulmonary disease) ([place])   . GERD (gastroesophageal reflux disease)   . Schizo affective schizophrenia [facility])     Past Surgical History:  Procedure Laterality Date  . CARPAL TUNNEL RELEASE  02/27/2012   Procedure: CARPAL TUNNEL RELEASE;  Surgeon: Schuyler Amor, MD;  Location: [facility];  Service: Orthopedics;  Laterality: Left;  . OPEN REDUCTION  INTERNAL FIXATION (ORIF) DISTAL RADIAL FRACTURE  02/27/2012   Procedure: OPEN REDUCTION INTERNAL FIXATION (ORIF) DISTAL RADIAL FRACTURE;  Surgeon: Schuyler Amor, MD;  Location: [place];  Service: Orthopedics;  Laterality: Left;  Open reduction internal fixation of  left distal radius fracture.      reports that he has quit smoking. He has never used smokeless tobacco. He reports that he does not drink alcohol and does not use drugs.  Allergies  Allergen Reactions  . Penicillins Anaphylaxis    History reviewed. No pertinent family history.   Prior to Admission medications   Medication Sig Start Date End Date Taking? Authorizing Provider  albuterol (PROVENTIL HFA;VENTOLIN HFA) 108 (90 BASE) MCG/ACT inhaler Inhale 2 puffs into the lungs every 4 (four) hours as needed for shortness of breath.    Yes [provider]  ALPRAZolam (XANAX) 0.25 MG tablet Take 1 tablet (0.25 mg total) by mouth 2 (two) times daily as needed for anxiety. 09/12/19  Yes [place] Eric J, DO  ARIPiprazole (ABILIFY) 10 MG tablet Take 10 mg by mouth in the morning and at bedtime.    Yes [provider]  aspirin EC 81 MG tablet Take 81 mg by mouth in the morning and at bedtime. Swallow whole.   Yes [provider]  benztropine (COGENTIN) 0.5 MG tablet Take 0.5 mg by mouth 2 (two) times daily.   Yes [provider]  Calcium Carb-Cholecalciferol (CALCIUM 500 +D PO) Take 1 tablet  by mouth daily.   Yes [provider]  divalproex (DEPAKOTE) 125 MG DR tablet Take 125 mg by mouth 3 (three) times daily.   Yes [provider]  ferrous sulfate (FEROSUL) 325 (65 FE) MG tablet Take 325 mg by mouth 2 (two) times daily with a meal.   Yes [provider]  Fluticasone-Salmeterol (ADVAIR) 250-50 MCG/DOSE AEPB Inhale 1 puff into the lungs every 12 (twelve) hours.   Yes [provider]  meloxicam (MOBIC) 7.5 MG tablet Take 7.5 mg by mouth daily.   Yes [provider]  Multiple Vitamins-Minerals (CEROVITE SENIOR) TABS Take 1 tablet by mouth daily.   Yes [provider]  OLANZapine (ZYPREXA) 10 MG tablet Take 10 mg by mouth 2 (two) times daily.    Yes [provider]  oxyCODONE (OXY IR/ROXICODONE) 5 MG immediate release tablet Take 1 tablet (5 mg total) by mouth every 6 (six) hours as needed for moderate pain. 09/12/19 10/12/19 Yes [place] Eric J, DO  pantoprazole (PROTONIX) 40 MG tablet Take 40 mg by mouth daily.   Yes [provider]  risperidone (RISPERDAL) 4 MG tablet Take 4 mg by mouth 2 (two) times daily.    Yes [provider]  senna-docusate (SENOKOT-S) 8.6-50 MG tablet Take 1 tablet by mouth 2 (two) times daily as needed for constipation. 08/23/19  Yes [provider]  tamsulosin (FLOMAX) 0.4 MG CAPS capsule Take 0.4 mg by mouth daily.   Yes [provider]  traZODone (DESYREL) 50 MG tablet Take 50 mg by mouth at bedtime as needed for sleep.    Yes [provider]    Physical Exam: Vitals:   09/29/19 1800 09/29/19 1815 09/29/19 1830 09/29/19 1948  BP: 105/63 103/64 108/74 128/77  Pulse:    (!) 101  Resp:  10  16  Temp:      TempSrc:      SpO2:    99%    Constitutional: NAD, calm, comfortable Eyes: PERRL, lids and conjunctivae normal ENMT: Mucous membranes are moist. Posterior pharynx clear of any exudate or lesions.Normal dentition.  Neck: normal, supple, no masses, no thyromegaly Respiratory: clear to auscultation bilaterally, no wheezing, no crackles. Normal respiratory effort. No accessory muscle use.  Cardiovascular: Regular rate and rhythm, no murmurs / rubs / gallops. No extremity edema. 2+ pedal pulses. No carotid bruits.  Abdomen: no tenderness, no masses palpated. No hepatosplenomegaly. Bowel sounds positive.  Musculoskeletal: no clubbing / cyanosis. No joint deformity upper and lower extremities. Good ROM, no contractures. Normal muscle tone.  Skin: no  rashes, lesions, ulcers. No induration Neurologic: CN 2-12 grossly intact. Sensation intact, DTR normal. Strength 5/5 in all 4.  Psychiatric: Normal judgment and insight. Alert and oriented x 3. Normal mood.    Labs on Admission: I have personally reviewed following labs and imaging studies  CBC: Recent Labs  Lab 09/29/19 1815  WBC 10.9*  HGB 12.4*  HCT 40.0  MCV 89.7  PLT 867   Basic Metabolic Panel: Recent Labs  Lab 09/29/19 1815  NA 135  K 4.1  CL 99  CO2 26  GLUCOSE 103*  BUN 22*  CREATININE 1.16  CALCIUM 9.0   GFR: Estimated Creatinine Clearance: 82.3 mL/min (by C-G formula based on SCr of 1.16 mg/dL). Liver Function Tests: Recent Labs  Lab 09/29/19 1815  AST 22  ALT 22  ALKPHOS 114  BILITOT 0.7  PROT 6.7  ALBUMIN 3.3*   No results for input(s): LIPASE, AMYLASE in the  last 168 hours. No results for input(s): AMMONIA in the last 168 hours. Coagulation Profile: No results for input(s): INR, PROTIME in the last 168 hours. Cardiac Enzymes: No results for input(s): CKTOTAL, CKMB, CKMBINDEX, TROPONINI in the last 168 hours. BNP (last 3 results) No results for input(s): PROBNP in the last 8760 hours. HbA1C: No results for input(s): HGBA1C in the last 72 hours. CBG: No results for input(s): GLUCAP in the last 168 hours. Lipid Profile: No results for input(s): CHOL, HDL, LDLCALC, TRIG, CHOLHDL, LDLDIRECT in the last 72 hours. Thyroid Function Tests: No results for input(s): TSH, T4TOTAL, FREET4, T3FREE, THYROIDAB in the last 72 hours. Anemia Panel: No results for input(s): VITAMINB12, FOLATE, FERRITIN, TIBC, IRON, RETICCTPCT in the last 72 hours. Urine analysis:    Component Value Date/Time   COLORURINE RED (A) 09/29/2019 1923   APPEARANCEUR TURBID (A) 09/29/2019 1923   LABSPEC 1.020 09/29/2019 1923   PHURINE 7.0 09/29/2019 1923   GLUCOSEU NEGATIVE 09/29/2019 1923   HGBUR LARGE (A) 09/29/2019 1923   BILIRUBINUR NEGATIVE 09/29/2019 [place]  NEGATIVE 09/29/2019 1923   PROTEINUR >300 (A) 09/29/2019 1923   UROBILINOGEN 0.2 05/12/2013 2100   NITRITE POSITIVE (A) 09/29/2019 1923   LEUKOCYTESUR MODERATE (A) 09/29/2019 1923    Radiological Exams on Admission: DG Chest Port 1 View  Result Date: 09/29/2019 CLINICAL DATA:  Tired.  Question pneumonia. EXAM: PORTABLE CHEST 1 VIEW COMPARISON:  Radiograph 09/09/2019 FINDINGS: The cardiomediastinal contours are normal. The lungs are clear. Pulmonary vasculature is normal. No consolidation, pleural effusion, or pneumothorax. No acute osseous abnormalities are seen. IMPRESSION: No evidence of pneumonia. Electronically Signed   By: Keith Rake M.D.   On: 09/29/2019 18:45    EKG: Independently reviewed.  Assessment/Plan Principal Problem:   Catheter-associated urinary tract infection (HCC) Active Problems:   Schizophrenia (HCC)   Neurogenic bladder    1. CAUTI - 1. Cefepime 2. UCx 3. IVF: 2L bolus in ED, hold off on further IVF for the moment 2. Neurogenic bladder - 1. Patient apparently able to urinate in ED yesterday, today, and bladder scan in ED today only showing 241cc of urine. 2. Will get formal renal US to confirm presence or lack of major retention. 3. Strict intake and output 4. If not large amount of urinary retention, then may be able to get away without foley... which he is refusing anyhow currently 5. On the other hand, obviously if he has 2700cc retention like last admit, he needs a foley.  Try to use that information to convince him. 6. If large amount of urinary retention and still unwilling to get foley, get TTS consult and see if he has decision making capacity. 3. Schizophrenia - cont baseline meds  DVT prophylaxis: SCDs - hematuria Code Status: Full Family Communication: No family in room Disposition Plan: Facility after treatment for UTI and decision make regarding need or lack of need for chronic foley catheter Consults called: None, may need TTS consult  depending on US findings. Admission status: Place in obs   Resa Rinks, [place] Hospitalists  How to contact the [facility] Attending or Consulting provider [place] or covering provider during after hours [place], for this patient?  1. Check the care team in [facility] and look for a) attending/consulting TRH provider listed and b) the [facility] team listed 2. Log into [email or web address]  Amion Physician Scheduling and messaging for groups and whole hospitals  On call and physician scheduling software for group practices, residents, hospitalists  and other medical providers for call, clinic, rotation and shift schedules. OnCall [employer] is a hospital-wide system for scheduling doctors and paging doctors on call. EasyPlot is for scientific plotting and data analysis.  [email or web address]  and use [place]'s universal password to access. If you do not have the password, please contact the hospital operator.  3. Locate the [facility] provider you are looking for under [facility] and page to a number that you can be directly reached. 4. If you still have difficulty reaching the provider, please page the [facility] (Director on Call) for the Hospitalists listed on amion for assistance.  09/29/2019, 9:21 PM

## 2019-09-29 NOTE — Progress Notes (Signed)
Pt developing further SIRS / sepsis: HR 001, BP 95 systolic.  Got 2L bolus earlier.  Ordering LR at 125cc/hr.  On cefepime for nitrite positive UTI.  Foley in place and draining.

## 2019-09-29 NOTE — ED Notes (Signed)
Bladder scan done on PT, recorded 241 ml

## 2019-09-29 NOTE — ED Notes (Signed)
Luz Brazen, [facility] spoke with Development worker, international aid of [place] and since patient has voided (see previous chart), he can return. Pt was already discharged and does not want to see a doctor again as he has no complaints. He wants to return home and continues to refuse catheterization. Facility aware and [facility] transporting.

## 2019-09-29 NOTE — ED Provider Notes (Signed)
[facility] Emergency Department Provider Note MRN:  [id number]  [place] date & time: 09/29/19     Chief Complaint   Altered Mental Status   History of Present Illness   Lee House is a 54 y.o. year-old male with a history of schizophrenia, COPD, bipolar disorder, asthma presenting to the ED with chief complaint of altered mental status.  Patient sent from facility without much report.  Was seen yesterday after he pulled out his Foley catheter.  He refused to have it be replaced and he was sent back to his facility.  Facility wants him to have a Foley catheter.  Patient has no complaints but also has cognitive impairment largely related to psychiatric conditions.  Unclear baseline.  I was unable to obtain an accurate HPI, PMH, or ROS due to the patient's psychiatric conditions.  Level 5 caveat.  Review of Systems  Positive for Foley catheter issue.  Patient's Health History    Past Medical History:  Diagnosis Date  . Asthma   . Bipolar 1 disorder ([place])   . COPD (chronic obstructive pulmonary disease) ([place])   . GERD (gastroesophageal reflux disease)   . Schizo affective schizophrenia [facility])     Past Surgical History:  Procedure Laterality Date  . CARPAL TUNNEL RELEASE  02/27/2012   Procedure: CARPAL TUNNEL RELEASE;  Surgeon: Schuyler Amor, MD;  Location: [place];  Service: Orthopedics;  Laterality: Left;  . OPEN REDUCTION INTERNAL FIXATION (ORIF) DISTAL RADIAL FRACTURE  02/27/2012   Procedure: OPEN REDUCTION INTERNAL FIXATION (ORIF) DISTAL RADIAL FRACTURE;  Surgeon: Schuyler Amor, MD;  Location: [place];  Service: Orthopedics;  Laterality: Left;  Open reduction internal fixation of  left distal radius fracture.     History reviewed. No pertinent family history.  Social History   Socioeconomic History  . Marital status: Single    Spouse name: Not on file  . Number of children: Not on file  . Years of education: Not on file  . Highest  education level: Not on file  Occupational History  . Not on file  Tobacco Use  . Smoking status: Former Research scientist (life sciences)  . Smokeless tobacco: Never Used  Substance and Sexual Activity  . Alcohol use: No  . Drug use: No  . Sexual activity: Not on file  Other Topics Concern  . Not on file  Social History Narrative  . Not on file   Social Determinants of Health   Financial Resource Strain:   . Difficulty of Paying Living Expenses:   Food Insecurity:   . Worried About Charity fundraiser in the Last Year:   . Arboriculturist in the Last Year:   Transportation Needs:   . Film/video editor (Medical):   Marland Kitchen Lack of Transportation (Non-Medical):   Physical Activity:   . Days of Exercise per Week:   . Minutes of Exercise per Session:   Stress:   . Feeling of Stress :   Social Connections:   . Frequency of Communication with Friends and Family:   . Frequency of Social Gatherings with Friends and Family:   . Attends Religious Services:   . Active Member of Clubs or Organizations:   . Attends Archivist Meetings:   Marland Kitchen Marital Status:   Intimate Partner Violence:   . Fear of Current or Ex-Partner:   . Emotionally Abused:   Marland Kitchen Physically Abused:   . Sexually Abused:      Physical Exam   Vitals:  09/29/19 2258 09/29/19 2313  BP: (!) 108/58   Pulse: (!) 113   Resp: (!) 22   Temp:  (!) 103.3 F (39.6 C)  SpO2: 97%     CONSTITUTIONAL: Chronically ill-appearing, NAD NEURO:  Alert and oriented x 2, moves all extremities, answers simple questions EYES:  eyes equal and reactive ENT/NECK:  no LAD, no JVD CARDIO: Regular rate, well-perfused, normal S1 and S2 PULM:  CTAB no wheezing or rhonchi GI/GU:  normal bowel sounds, non-distended, non-tender MSK/SPINE:  No gross deformities, no edema SKIN:  no rash, atraumatic PSYCH:  Appropriate speech and behavior  *Additional and/or pertinent findings included in MDM below  Diagnostic and Interventional Summary    EKG  Interpretation  Date/Time:  Sunday September 29 2019 18:13:45 EDT Ventricular Rate:  91 PR Interval:    QRS Duration: 104 QT Interval:  351 QTC Calculation: 432 R Axis:   62 Text Interpretation: Sinus rhythm Confirmed by Gerlene Fee [phone]) on 09/29/2019 7:32:34 PM      Labs Reviewed  CBC - Abnormal; Notable for the following components:      Result Value   WBC 10.9 (*)    Hemoglobin 12.4 (*)    All other components within normal limits  COMPREHENSIVE METABOLIC PANEL - Abnormal; Notable for the following components:   Glucose, Bld 103 (*)    BUN 22 (*)    Albumin 3.3 (*)    All other components within normal limits  URINALYSIS, ROUTINE W REFLEX MICROSCOPIC - Abnormal; Notable for the following components:   Color, Urine RED (*)    APPearance TURBID (*)    Hgb urine dipstick LARGE (*)    Protein, ur >300 (*)    Nitrite POSITIVE (*)    Leukocytes,Ua MODERATE (*)    All other components within normal limits  URINALYSIS, MICROSCOPIC (REFLEX) - Abnormal; Notable for the following components:   Bacteria, UA MANY (*)    All other components within normal limits  SARS CORONAVIRUS 2 BY RT PCR (HOSPITAL ORDER, [facility])  CULTURE, BLOOD (SINGLE)  URINE CULTURE  CORTISOL-AM, BLOOD  PROCALCITONIN  CBC  BASIC METABOLIC PANEL  PROTIME-INR    DG Chest Port 1 View  Final Result    US RENAL    (Results Pending)    Medications  acetaminophen (TYLENOL) tablet 650 mg ( Oral See Alternative 09/29/19 2226)    Or  acetaminophen (TYLENOL) suppository 650 mg (650 mg Rectal Given 09/29/19 2226)  ondansetron (ZOFRAN) tablet 4 mg (has no administration in time range)    Or  ondansetron (ZOFRAN) injection 4 mg (has no administration in time range)  ceFEPIme (MAXIPIME) 2 g in sodium chloride 0.9 % 100 mL IVPB (has no administration in time range)  ALPRAZolam (XANAX) tablet 0.25 mg (has no administration in time range)  ARIPiprazole (ABILIFY) tablet 10 mg (10 mg Oral  Given 09/29/19 2236)  albuterol (PROVENTIL) (2.5 MG/3ML) 0.083% nebulizer solution 3 mL (has no administration in time range)  aspirin EC tablet 81 mg (81 mg Oral Given 09/29/19 2234)  benztropine (COGENTIN) tablet 0.5 mg (0.5 mg Oral Given 09/29/19 2234)  divalproex (DEPAKOTE) DR tablet 125 mg (125 mg Oral Given 09/29/19 2234)  ferrous sulfate tablet 325 mg (has no administration in time range)  oxyCODONE (Oxy IR/ROXICODONE) immediate release tablet 5 mg (has no administration in time range)  OLANZapine (ZYPREXA) tablet 10 mg (10 mg Oral Given 09/29/19 2235)  meloxicam (MOBIC) tablet 7.5 mg (has no administration in time  range)  mometasone-formoterol (DULERA) 200-5 MCG/ACT inhaler 2 puff (2 puffs Inhalation Not Given 09/29/19 2126)  traZODone (DESYREL) tablet 50 mg (has no administration in time range)  tamsulosin (FLOMAX) capsule 0.4 mg (has no administration in time range)  risperiDONE (RISPERDAL) tablet 4 mg (4 mg Oral Given 09/29/19 2235)  senna-docusate (Senokot-S) tablet 1 tablet (has no administration in time range)  pantoprazole (PROTONIX) EC tablet 40 mg (has no administration in time range)  calcium-vitamin D (OSCAL WITH D) 500-200 MG-UNIT per tablet 1 tablet (has no administration in time range)  multivitamin with minerals tablet 1 tablet (has no administration in time range)  sodium chloride 0.9 % bolus 1,000 mL (0 mLs Intravenous Stopped 09/29/19 2233)  haloperidol lactate (HALDOL) injection 2 mg (2 mg Intravenous Given 09/29/19 1949)  cefTRIAXone (ROCEPHIN) 2 g in sodium chloride 0.9 % 100 mL IVPB (0 g Intravenous Stopped 09/29/19 2224)  sodium chloride 0.9 % bolus 1,000 mL (1,000 mLs Intravenous Bolus from Bag 09/29/19 2232)  ceFEPIme (MAXIPIME) 2 g in sodium chloride 0.9 % 100 mL IVPB (0 g Intravenous Stopped 09/29/19 2305)     Procedures  /  Critical Care Procedures  ED Course and Medical Decision Making  I have reviewed the triage vital signs, the nursing notes, and pertinent  available records from the EMR.  Listed above are laboratory and imaging tests that I personally ordered, reviewed, and interpreted and then considered in my medical decision making (see below for details).      Unclear reason for ED visit from [facility], however patient arrives hypotensive with a oral temperature of 100.1.  He has no complaints.  Will need work-up for possible infection, will provide fluids and monitor closely, awaiting callback from the facility to gather more information.  Urinalysis consistent with infection, and given patient's initial low blood pressure, oral temperature 100.1 there is concern for systemic illness and possibly early sepsis.  Patient given 2 L of fluid, admitted to hospital service for further care.  Barth Kirks. Sedonia Small, MD [place] [email or web address]  Final Clinical Impressions(s) / ED Diagnoses     ICD-10-CM   1. Lower urinary tract infectious disease  N39.0   2. Neurogenic bladder  N31.9 US RENAL    US RENAL    CANCELED: US RENAL    CANCELED: US RENAL    ED Discharge Orders    None       Discharge Instructions Discussed with and Provided to Patient:   Discharge Instructions   None       Maudie Flakes, MD 09/29/19 2325

## 2019-09-29 NOTE — Progress Notes (Signed)
Pharmacy Antibiotic Note  Lee House is a 54 y.o. male admitted on 09/29/2019 with UTI.  Pharmacy has been consulted for cefepime dosing. Pt with Tmax 100.1 and WBC 10.9. SCr is WNL.  Plan: Cefepime 2g IV Q12H F/u renal fxn, C&S, clinical status    Temp (24hrs), Avg:99.1 F (37.3 C), Min:98.1 F (36.7 C), Max:100.1 F (37.8 C)  Recent Labs  Lab 09/29/19 1815  WBC 10.9*  CREATININE 1.16    Estimated Creatinine Clearance: 82.3 mL/min (by C-G formula based on SCr of 1.16 mg/dL).    Allergies  Allergen Reactions  . Penicillins Anaphylaxis    Antimicrobials this admission: Cefepime 6/13>> CTX x 1 6/13  Dose adjustments this admission: N/A  Microbiology results: Pending  Thank you for allowing pharmacy to be a part of this patient's care.  Asad Keeven, Rande Lawman 09/29/2019 9:21 PM

## 2019-09-29 NOTE — ED Triage Notes (Addendum)
Pt BIB [facility] from [facility]. Facility states pt was seen yesterday at [facility] and should not have been sent back to the facility. Pt is suppose to have a foley due to urinary rentention and UTI but has a hx of pulling foley out. Pt has a hx of schizophrenia and believes the foley is harming him when he pulls it out. RN from [employer] stated pt was A&Ox4 and did not require the need for a foley yesterday before discharge. Pt does not have any complaints besides he is tired and pt denies any pain. Pt is currently A&Ox2.

## 2019-09-29 NOTE — Progress Notes (Addendum)
Pt now with AMS and fever of 105.1.  Given tylenol. Changing bed req to SDU Ordering cooling blanket. US Renal to be done portable stat.  Find out: 1) how much in bladder and 2) any hydronephrosis.  Suspect he needs foley catheter.  Update: foley in, 375cc UOP.  Renal US complete, results pending.

## 2019-09-30 ENCOUNTER — Observation Stay (HOSPITAL_COMMUNITY): Payer: 59

## 2019-09-30 DIAGNOSIS — E876 Hypokalemia: Secondary | ICD-10-CM | POA: Diagnosis present

## 2019-09-30 DIAGNOSIS — F203 Undifferentiated schizophrenia: Secondary | ICD-10-CM | POA: Diagnosis not present

## 2019-09-30 DIAGNOSIS — N179 Acute kidney failure, unspecified: Secondary | ICD-10-CM | POA: Diagnosis present

## 2019-09-30 DIAGNOSIS — R6521 Severe sepsis with septic shock: Secondary | ICD-10-CM

## 2019-09-30 DIAGNOSIS — B9689 Other specified bacterial agents as the cause of diseases classified elsewhere: Secondary | ICD-10-CM | POA: Diagnosis present

## 2019-09-30 DIAGNOSIS — Z880 Allergy status to penicillin: Secondary | ICD-10-CM | POA: Diagnosis not present

## 2019-09-30 DIAGNOSIS — N3001 Acute cystitis with hematuria: Secondary | ICD-10-CM

## 2019-09-30 DIAGNOSIS — Z7982 Long term (current) use of aspirin: Secondary | ICD-10-CM | POA: Diagnosis not present

## 2019-09-30 DIAGNOSIS — A419 Sepsis, unspecified organism: Secondary | ICD-10-CM | POA: Diagnosis present

## 2019-09-30 DIAGNOSIS — T83511D Infection and inflammatory reaction due to indwelling urethral catheter, subsequent encounter: Secondary | ICD-10-CM | POA: Diagnosis not present

## 2019-09-30 DIAGNOSIS — N390 Urinary tract infection, site not specified: Secondary | ICD-10-CM | POA: Diagnosis present

## 2019-09-30 DIAGNOSIS — T83518A Infection and inflammatory reaction due to other urinary catheter, initial encounter: Secondary | ICD-10-CM | POA: Diagnosis present

## 2019-09-30 DIAGNOSIS — F200 Paranoid schizophrenia: Secondary | ICD-10-CM

## 2019-09-30 DIAGNOSIS — Z87892 Personal history of anaphylaxis: Secondary | ICD-10-CM | POA: Diagnosis not present

## 2019-09-30 DIAGNOSIS — N319 Neuromuscular dysfunction of bladder, unspecified: Secondary | ICD-10-CM | POA: Diagnosis present

## 2019-09-30 DIAGNOSIS — Z87891 Personal history of nicotine dependence: Secondary | ICD-10-CM | POA: Diagnosis not present

## 2019-09-30 DIAGNOSIS — T83511A Infection and inflammatory reaction due to indwelling urethral catheter, initial encounter: Secondary | ICD-10-CM

## 2019-09-30 DIAGNOSIS — Y846 Urinary catheterization as the cause of abnormal reaction of the patient, or of later complication, without mention of misadventure at the time of the procedure: Secondary | ICD-10-CM | POA: Diagnosis present

## 2019-09-30 DIAGNOSIS — D509 Iron deficiency anemia, unspecified: Secondary | ICD-10-CM | POA: Diagnosis present

## 2019-09-30 DIAGNOSIS — G9341 Metabolic encephalopathy: Secondary | ICD-10-CM | POA: Diagnosis present

## 2019-09-30 DIAGNOSIS — F319 Bipolar disorder, unspecified: Secondary | ICD-10-CM | POA: Diagnosis present

## 2019-09-30 DIAGNOSIS — T360X5A Adverse effect of penicillins, initial encounter: Secondary | ICD-10-CM | POA: Diagnosis not present

## 2019-09-30 DIAGNOSIS — Z20822 Contact with and (suspected) exposure to covid-19: Secondary | ICD-10-CM | POA: Diagnosis present

## 2019-09-30 DIAGNOSIS — Z79899 Other long term (current) drug therapy: Secondary | ICD-10-CM | POA: Diagnosis not present

## 2019-09-30 DIAGNOSIS — B965 Pseudomonas (aeruginosa) (mallei) (pseudomallei) as the cause of diseases classified elsewhere: Secondary | ICD-10-CM | POA: Diagnosis present

## 2019-09-30 DIAGNOSIS — J449 Chronic obstructive pulmonary disease, unspecified: Secondary | ICD-10-CM | POA: Diagnosis present

## 2019-09-30 LAB — BASIC METABOLIC PANEL
Anion gap: 9 (ref 5–15)
BUN: 19 mg/dL (ref 6–20)
CO2: 22 mmol/L (ref 22–32)
Calcium: 8.1 mg/dL — ABNORMAL LOW (ref 8.9–10.3)
Chloride: 103 mmol/L (ref 98–111)
Creatinine, Ser: 0.86 mg/dL (ref 0.61–1.24)
GFR calc Af Amer: >60 mL/min (ref >60)
GFR calc non Af Amer: >60 mL/min (ref >60)
Glucose, Bld: 130 mg/dL — ABNORMAL HIGH (ref 70–99)
Potassium: 3.2 mmol/L — ABNORMAL LOW (ref 3.5–5.1)
Sodium: 134 mmol/L — ABNORMAL LOW (ref 135–145)

## 2019-09-30 LAB — PROTIME-INR
INR: 1.3 — ABNORMAL HIGH (ref 0.8–1.2)
Prothrombin Time: 15.7 seconds — ABNORMAL HIGH (ref 11.4–15.2)

## 2019-09-30 LAB — MRSA PCR SCREENING: MRSA by PCR: NEGATIVE

## 2019-09-30 LAB — CBC
HCT: 32.6 % — ABNORMAL LOW (ref 39.0–52.0)
Hemoglobin: 10.3 g/dL — ABNORMAL LOW (ref 13.0–17.0)
MCH: 28 pg (ref 26.0–34.0)
MCHC: 31.6 g/dL (ref 30.0–36.0)
MCV: 88.6 fL (ref 80.0–100.0)
Platelets: 189 10*3/uL (ref 150–400)
RBC: 3.68 MIL/uL — ABNORMAL LOW (ref 4.22–5.81)
RDW: 14 % (ref 11.5–15.5)
WBC: 11.2 10*3/uL — ABNORMAL HIGH (ref 4.0–10.5)
nRBC: 0 % (ref 0.0–0.2)

## 2019-09-30 LAB — ECHOCARDIOGRAM COMPLETE

## 2019-09-30 LAB — MAGNESIUM: Magnesium: 1.6 mg/dL — ABNORMAL LOW (ref 1.7–2.4)

## 2019-09-30 LAB — LACTIC ACID, PLASMA
Lactic Acid, Venous: 1.7 mmol/L (ref 0.5–1.9)
Lactic Acid, Venous: 2 mmol/L (ref 0.5–1.9)
Lactic Acid, Venous: 1.4 mmol/L (ref 0.5–1.9)

## 2019-09-30 LAB — PROCALCITONIN: Procalcitonin: 27.02 ng/mL

## 2019-09-30 LAB — GLUCOSE, CAPILLARY: Glucose-Capillary: 106 mg/dL — ABNORMAL HIGH (ref 70–99)

## 2019-09-30 LAB — CORTISOL-AM, BLOOD: Cortisol - AM: 16.4 ug/dL (ref 6.7–22.6)

## 2019-09-30 MED ORDER — NOREPINEPHRINE 4 MG/250ML-% IV SOLN
2.0000 ug/min | INTRAVENOUS | Status: DC
  Administered 2019-09-30: 2 ug/min via INTRAVENOUS
  Administered 2019-10-01: 1 ug/min via INTRAVENOUS
  Filled 2019-09-30 (×2): qty 250

## 2019-09-30 MED ORDER — LACTATED RINGERS IV BOLUS
1000.0000 mL | Freq: Once | INTRAVENOUS | Status: AC
  Administered 2019-09-30: 1000 mL via INTRAVENOUS

## 2019-09-30 MED ORDER — POTASSIUM CHLORIDE 10 MEQ/100ML IV SOLN
10.0000 meq | INTRAVENOUS | Status: AC
  Administered 2019-09-30 (×5): 10 meq via INTRAVENOUS
  Filled 2019-09-30 (×5): qty 100

## 2019-09-30 MED ORDER — CHLORHEXIDINE GLUCONATE CLOTH 2 % EX PADS
6.0000 | MEDICATED_PAD | Freq: Every day | CUTANEOUS | Status: DC
  Administered 2019-09-30 – 2019-10-14 (×14): 6 via TOPICAL

## 2019-09-30 MED ORDER — LACTATED RINGERS IV BOLUS
500.0000 mL | Freq: Once | INTRAVENOUS | Status: AC
  Administered 2019-09-30: 500 mL via INTRAVENOUS

## 2019-09-30 MED ORDER — SODIUM CHLORIDE 0.9 % IV BOLUS
1000.0000 mL | Freq: Once | INTRAVENOUS | Status: AC
  Administered 2019-09-30: 1000 mL via INTRAVENOUS

## 2019-09-30 MED ORDER — HEPARIN SODIUM (PORCINE) 5000 UNIT/ML IJ SOLN
5000.0000 [IU] | Freq: Three times a day (TID) | INTRAMUSCULAR | Status: DC
  Administered 2019-09-30 – 2019-10-14 (×43): 5000 [IU] via SUBCUTANEOUS
  Filled 2019-09-30 (×41): qty 1

## 2019-09-30 MED ORDER — IBUPROFEN 400 MG PO TABS
800.0000 mg | ORAL_TABLET | Freq: Four times a day (QID) | ORAL | Status: DC | PRN
  Administered 2019-09-30: 800 mg via ORAL
  Filled 2019-09-30: qty 2

## 2019-09-30 MED ORDER — MAGNESIUM SULFATE 2 GM/50ML IV SOLN
2.0000 g | Freq: Once | INTRAVENOUS | Status: AC
  Administered 2019-09-30: 2 g via INTRAVENOUS
  Filled 2019-09-30: qty 50

## 2019-09-30 MED ORDER — SODIUM CHLORIDE 0.9 % IV SOLN
250.0000 mL | INTRAVENOUS | Status: DC
  Administered 2019-09-30: 250 mL via INTRAVENOUS

## 2019-09-30 MED ORDER — SODIUM CHLORIDE 0.9 % IV SOLN: INTRAVENOUS | Status: DC | PRN

## 2019-09-30 NOTE — Progress Notes (Signed)
  Echocardiogram 2D Echocardiogram has been attempted. Patient OTF/ not in room. Will reattempt at later time.  [name] 09/30/2019, 8:49 AM

## 2019-09-30 NOTE — Progress Notes (Signed)
Dr. Alcario Drought return call o.k. with L.A. 2.0

## 2019-09-30 NOTE — Progress Notes (Signed)
Called by RN - BP now trended down to 78/55.  Fever improved to 99.7 and HR 100-110.  No respiratory difficulty: satting 99% on RA.  The down trending BP is of course worrisome for worsening sepsis: 1) doing LR 1L bolus, this will be 3rd L bolus for pt tonight (has been on 125 cc/hr LR while not being bolused) 2) Informed Dr. Gillermina Phy (PCCM) about pt, just to put him on their radar 3) Per Dr. Gillermina Phy: if we get to 4-5L, if pt starts to have respiratory problems (fluid overload), or if pt stops responding to IVF; then will need to start levophed through peripheral IV and PCCM will need to formally consult / take pt to ICU.

## 2019-09-30 NOTE — Progress Notes (Signed)
  Echocardiogram 2D Echocardiogram has been performed.  Jennette Dubin 09/30/2019, 2:41 PM

## 2019-09-30 NOTE — Progress Notes (Addendum)
Spoke to RN about update since MEWS 6  HR 140 now, fever down to 102.7  Will add ibuprofen to see if we can break fever.  Pt with sepsis from UTI as noted in prior notes in setting of neurogenic bladder...  Foley now in place and draining as noted earlier (pt agreed to foley).  [place] neg for any hydronephrosis findings.  Lactate pending.  BP remains good with MAPs consistently above 100.  Most recent BP 134/117.  So dont see any indication for pressors at this point.  If BP starts trending down or if Lactate comes back very elevated, then would increase IVF and call PCCM.  Pt denies pain anywhere.  No SOB, no cough, No ulcers, no skin breakdown, no erythema or rash anywhere.  I dont really have a reason to suspect another source of infection.  UA is nitrite positive, thus I would suspect the causative organism to be a gram negative bacteria, so I dont really have a reason to add MRSA coverage at this time.  Will hold flomax in setting of sepsis.

## 2019-09-30 NOTE — Progress Notes (Signed)
PROGRESS NOTE  Lee House FMB:[id number] DOB: 03-26-1966   PCP: Patient, [name] Per  Patient is from: Nursing home, [facility]  DOA: 09/29/2019 LOS: 0  Brief Narrative / Interim history: 54 year old male with history of schizophrenia/paranoia, bipolar disorder, COPD/asthma and neurogenic bladder with chronic Foley.  Patient presented to [facility] on 6/12 after pulling his Foley catheter but refused to have Foley replaced and discharged back to SNF just to be sent back to ED on 6/13 for Foley placement.  In ED, hypotensive requiring IV boluses.  UA concerning for UTI.  Spiked fever to 105.1.  In ED, slight temperature to 100.1.  Hypotensive to 88/56.  HR 94.  100% on room air.  UA concerning for UTI. Cultures obtained.  LA 1.7.  WBC 11.2.  CXR without acute finding.  He was started on ceftriaxone.  However, patient spiked fever to 105.1.  HR up to 120s.  Remained hypotensive despite IV fluid boluses.  Antibiotic escalated to cefepime.  Started on cooling blanket.  Upgraded to stepdown unit.   Patient remained hypotensive despite 4 to 5 L of IV fluid boluses.  Lactic acid increased to 2.0.  Also some mental status changes.  Additional IV fluid bolus ordered.  PCCM consulted out of concern for septic shock.  Subjective: Paged by patient's RN about his low blood pressure.  When I went to see him, patient was lethargic and barely follows command.  No apparent focal neuro deficit.  SBP in 70s to 80s.  No apparent respiratory distress.  Objective: Vitals:   09/30/19 0614 09/30/19 0704 09/30/19 0735 09/30/19 0737  BP: (!) 80/58 (!) 71/47 (!) 77/50 (!) 82/56  Pulse: 80 79 85 80  Resp: 16 16 12 15   Temp: (!) 96.9 F (36.1 C) (!) 97 F (36.1 C) (!) 97.4 F (36.3 C) (!) 97.5 F (36.4 C)  TempSrc: Rectal Rectal Rectal Rectal  SpO2: 100% 96% 100% 100%    Intake/Output Summary (Last 24 hours) at 09/30/2019 0744 Last data filed at 09/30/2019 0540 Gross per 24 hour  Intake 3680 ml  Output  1275 ml  Net 2405 ml   There were no vitals filed for this visit.  Examination:  GENERAL: Somewhat lethargic or sleepy.  Briefly wakes to voice but falls back to sleep. HEENT: MMM.  Vision and hearing grossly intact.  NECK: Supple.  No apparent JVD.  RESP:  No IWOB.  Fair aeration bilaterally. CVS:  RRR. Heart sounds normal.  ABD/GI/GU: BS+. Abd soft, NTND.  Foley in place. MSK/EXT:  Moves extremities. No apparent deformity. No edema.  SKIN: no apparent skin lesion or wound NEURO: Sleepy.  Briefly wakes to voice and falls back to sleep.  Barely follows command.  No apparent focal neuro deficit. PSYCH: Sleepy lethargic.  Procedures:  None  Microbiology summarized: COVID-19 PCR negative. Urine cultures pending. Blood cultures pending. MRSA PCR negative.  Assessment & Plan: Septic shock likely due to complicated UTI and patient with history of neurogenic bladder/chronic Foley -Transferred to ICU by PCCM for vasopressor support and further care. -Obtain 2 sets of blood cultures-only one ordered previously.  Acute metabolic encephalopathy-could be due to sepsis.  Also received multiple antipsychotics in ED. -Treat sepsis as above -Would benefit from psych consult on his psych medications.  Neurogenic bladder with chronic Foley -Foley reinserted in ED.  It seems to nursing facility wants foley reinserted after patient pulled out his foley. Not sure if depends or diapers would be an option  Schizophrenia/bipolar disorder-on multiple antipsychotics.  At risk for QTC prolongation. -Could benefit from psych consult -Optimize Mg and K   Chronic COPD/asthma: Stable -Continue breathing treatments         DVT prophylaxis:  SCDs Start: 09/29/19 2112  Code Status: Full code Family Communication: Patient and/or RN. Available if any question. Status is: Inpatient  The patient will require care spanning > 2 midnights and should be moved to inpatient because: Hemodynamically  unstable, Altered mental status, Ongoing diagnostic testing needed not appropriate for outpatient work up, IV treatments appropriate due to intensity of illness or inability to take PO and Inpatient level of care appropriate due to severity of illness  Dispo: The patient is from: SNF              Anticipated d/c is to: SNF              Anticipated d/c date is: 3 days              Patient currently is not medically stable to d/c.       Consultants:  PCCM   Sch Meds:  Scheduled Meds: . ARIPiprazole  10 mg Oral BID  . aspirin EC  81 mg Oral BID  . benztropine  0.5 mg Oral BID  . calcium-vitamin D  1 tablet Oral Q breakfast  . Chlorhexidine Gluconate Cloth  6 each Topical Daily  . divalproex  125 mg Oral TID  . ferrous sulfate  325 mg Oral BID WC  . meloxicam  7.5 mg Oral Daily  . mometasone-formoterol  2 puff Inhalation BID  . multivitamin with minerals  1 tablet Oral Daily  . OLANZapine  10 mg Oral BID  . pantoprazole  40 mg Oral Daily  . risperidone  4 mg Oral BID   Continuous Infusions: . ceFEPime (MAXIPIME) IV    . lactated ringers 125 mL/hr at 09/30/19 0359  . potassium chloride     PRN Meds:.acetaminophen **OR** acetaminophen, albuterol, ALPRAZolam, ibuprofen, ondansetron **OR** ondansetron (ZOFRAN) IV, oxyCODONE, senna-docusate, traZODone  Antimicrobials: Anti-infectives (From admission, onward)   Start     Dose/Rate Route Frequency Ordered Stop   09/30/19 1000  ceFEPIme (MAXIPIME) 2 g in sodium chloride 0.9 % 100 mL IVPB     Discontinue     2 g 200 mL/hr over 30 Minutes Intravenous Every 12 hours 09/29/19 2120     09/29/19 2200  ceFEPIme (MAXIPIME) 2 g in sodium chloride 0.9 % 100 mL IVPB        2 g 200 mL/hr over 30 Minutes Intravenous  Once 09/29/19 2120 09/29/19 2305   09/29/19 2130  ceFEPIme (MAXIPIME) 2 g in sodium chloride 0.9 % 100 mL IVPB  Status:  Discontinued        2 g 200 mL/hr over 30 Minutes Intravenous  Once 09/29/19 2116 09/29/19 2120   09/29/19  2000  cefTRIAXone (ROCEPHIN) 2 g in sodium chloride 0.9 % 100 mL IVPB        2 g 200 mL/hr over 30 Minutes Intravenous  Once 09/29/19 1956 09/29/19 2224       I have personally reviewed the following labs and images: CBC: Recent Labs  Lab 09/29/19 1815 09/30/19 0521  WBC 10.9* 11.2*  HGB 12.4* 10.3*  HCT 40.0 32.6*  MCV 89.7 88.6  PLT 278 189   BMP &GFR Recent Labs  Lab 09/29/19 1815 09/30/19 0521  NA 135 134*  K 4.1 3.2*  CL 99 103  CO2 26 22  GLUCOSE 103* 130*  BUN 22* 19  CREATININE 1.16 0.86  CALCIUM 9.0 8.1*   Estimated Creatinine Clearance: 111 mL/min (by C-G formula based on SCr of 0.86 mg/dL). Liver & Pancreas: Recent Labs  Lab 09/29/19 1815  AST 22  ALT 22  ALKPHOS 114  BILITOT 0.7  PROT 6.7  ALBUMIN 3.3*   No results for input(s): LIPASE, AMYLASE in the last 168 hours. No results for input(s): AMMONIA in the last 168 hours. Diabetic: No results for input(s): HGBA1C in the last 72 hours. No results for input(s): GLUCAP in the last 168 hours. Cardiac Enzymes: No results for input(s): CKTOTAL, CKMB, CKMBINDEX, TROPONINI in the last 168 hours. No results for input(s): PROBNP in the last 8760 hours. Coagulation Profile: Recent Labs  Lab 09/30/19 0521  INR 1.3*   Thyroid Function Tests: No results for input(s): TSH, T4TOTAL, FREET4, T3FREE, THYROIDAB in the last 72 hours. Lipid Profile: No results for input(s): CHOL, HDL, LDLCALC, TRIG, CHOLHDL, LDLDIRECT in the last 72 hours. Anemia Panel: No results for input(s): VITAMINB12, FOLATE, FERRITIN, TIBC, IRON, RETICCTPCT in the last 72 hours. Urine analysis:    Component Value Date/Time   COLORURINE RED (A) 09/29/2019 1923   APPEARANCEUR TURBID (A) 09/29/2019 1923   LABSPEC 1.020 09/29/2019 1923   PHURINE 7.0 09/29/2019 1923   GLUCOSEU NEGATIVE 09/29/2019 1923   HGBUR LARGE (A) 09/29/2019 1923   BILIRUBINUR NEGATIVE 09/29/2019 [place] NEGATIVE 09/29/2019 1923   PROTEINUR >300 (A)  09/29/2019 1923   UROBILINOGEN 0.2 05/12/2013 2100   NITRITE POSITIVE (A) 09/29/2019 1923   LEUKOCYTESUR MODERATE (A) 09/29/2019 1923   Sepsis Labs: Invalid input(s): PROCALCITONIN, [place]  Microbiology: Recent Results (from the past 240 hour(s))  SARS Coronavirus 2 by RT PCR (hospital order, performed in [facility] hospital lab) Nasopharyngeal Nasopharyngeal Swab     Status: None   Collection Time: 09/29/19  8:52 PM   Specimen: Nasopharyngeal Swab  Result Value Ref Range Status   SARS Coronavirus 2 NEGATIVE NEGATIVE Final    Comment: (NOTE) SARS-CoV-2 target nucleic acids are NOT DETECTED.  The SARS-CoV-2 RNA is generally detectable in upper and lower respiratory specimens during the acute phase of infection. The lowest concentration of SARS-CoV-2 viral copies this assay can detect is 250 copies / mL. A negative result does not preclude SARS-CoV-2 infection and should not be used as the sole basis for treatment or other patient management decisions.  A negative result may occur with improper specimen collection / handling, submission of specimen other than nasopharyngeal swab, presence of viral mutation(s) within the areas targeted by this assay, and inadequate number of viral copies (<250 copies / mL). A negative result must be combined with clinical observations, patient history, and epidemiological information.  Fact Sheet for Patients:   StrictlyIdeas.no  Fact Sheet for Healthcare Providers: BankingDealers.co.za  This test is not yet approved or  cleared by the [place] FDA and has been authorized for detection and/or diagnosis of SARS-CoV-2 by FDA under an Emergency Use Authorization (EUA).  This EUA will remain in effect (meaning this test can be used) for the duration of the COVID-19 declaration under Section 564(b)(1) of the Act, 21 U.S.C. section 360bbb-3(b)(1), unless the authorization is terminated  or revoked sooner.  Performed at [facility], [place] [address], [place], [place] [address]     Radiology Studies: US RENAL  Result Date: 09/29/2019 CLINICAL DATA:  History of neurogenic bladder EXAM: RENAL / URINARY TRACT ULTRASOUND COMPLETE COMPARISON:  None. FINDINGS: Right Kidney:  Renal measurements: 13.6 x 4.6 x 5.1 cm. = volume: 167 mL . Echogenicity within normal limits. No mass or hydronephrosis visualized. Left Kidney: Renal measurements: 10.4 x 4.0 x 4.3 cm. = volume: 93 mL. Echogenicity within normal limits. No mass or hydronephrosis visualized. Bladder: Bladder is partially distended with a Foley catheter in place. Wall thickening is noted consistent with the given clinical history of neurogenic bladder. Other: None. IMPRESSION: Normal-appearing kidneys. Wall thickening in the bladder consistent with the given clinical history. Foley catheter is noted in place. Electronically Signed   By: Inez Catalina M.D.   On: 09/29/2019 23:23   DG Chest Port 1 View  Result Date: 09/29/2019 CLINICAL DATA:  Tired.  Question pneumonia. EXAM: PORTABLE CHEST 1 VIEW COMPARISON:  Radiograph 09/09/2019 FINDINGS: The cardiomediastinal contours are normal. The lungs are clear. Pulmonary vasculature is normal. No consolidation, pleural effusion, or pneumothorax. No acute osseous abnormalities are seen. IMPRESSION: No evidence of pneumonia. Electronically Signed   By: Keith Rake M.D.   On: 09/29/2019 18:45   35 minutes with more than 50% spent in reviewing records, counseling patient/family and coordinating care.   Kayloni Rocco T. [place]  If 7PM-7AM, please contact night-coverage [email or web address] Password [facility] 09/30/2019, 7:44 AM

## 2019-09-30 NOTE — Progress Notes (Signed)
V.S.S. remain abnl. MEWS score 6 . MEWS has been Red. Not new. H.R. up 140 S.T. ,Rm. Air sats 100%, B.P. coming up 116/94 MAP 101, R-24  Skin very warm and dry. On cooling blanket Per [employer]. applied and set parameters . Dr. Alcario Drought aware of all vital signs and patient assessment

## 2019-09-30 NOTE — Progress Notes (Signed)
Temp 96.9 rectal Tim R.N. put warming blanket on Dr Alcario Drought return call o.k. with B.P, 80/58  MAP 35 Nikki Rn aware and into see patient

## 2019-09-30 NOTE — Progress Notes (Signed)
Text page Dr. Alcario Drought L.A. now 2.0. Tim R.N. aware

## 2019-09-30 NOTE — Progress Notes (Signed)
B.P. now 78/55 Dr. Alcario Drought return call see orders for L.R. fl. bolus one liter.Tim R.N. aware

## 2019-09-30 NOTE — Consult Note (Signed)
NAME:  Lee House, MRN:  [id number], DOB:  1965/08/17, LOS: 0 ADMISSION DATE:  09/29/2019, CONSULTATION DATE:  6/14 REFERRING MD:  Dr. Cyndia Skeeters, CHIEF COMPLAINT:  Hypotension    Brief History   54 y/o M admitted 6/13 after pulling out chronic foley with urosepsis (corynebacterium).    History of present illness   54 y/o M who presented to [facility] on 6/12 from [facility] after removing his foley catheter. The patient was admitted to [facility] for rehab after a broken femur s/p IM nail.  He initially refused replacement of the foley and was to return to the SNF but the facility would not take him without a foley.  He urinated and was sent back to the SNF.  He returned on 6/13 to the ER from the SNF with reports of altered mental status.  He was found to be hypotensive with an oral temperature of 100.1.  Foley catheter was placed with 2.7L of UOP.  UA was concerning for UTI (large hgb, mod leuks, nitirte posi, >300 protein, many bacteria, >50 WBC) and he was admitted for treatment of possible urosepsis with IVF and rocephin.  Recent (5/24) urine culture grew corynebacterium. He later developed fever of 105.1 and was given tylenol, cooling blanket and renal US was ordered.  ABX were adjusted to cefepime.  Renal US was negative for hydronephrosis.  Overnight 6/14, he had ongoing low blood pressure and altered mental status.    PCCM consulted for evaluation of urosepsis.    Past Medical History  Schizophrenia with paranoid tendencies Bipolar Disorder  COPD / Asthma  Neurogenic Bladder with chronic Foley  Significant Hospital Events   6/13 Admit with possible urosepsis   Consults:    Procedures:    Significant Diagnostic Tests:  Renal US 6/14 >> normal appearing kidneys, wall thickening in the bladder consistent with the given clinical hisotry  Micro Data:  COVID 6/13 >> negative  BCx2 6/13 >>   Antimicrobials:  Ceftriaxone 6/13 x1  Cefepime 6/13 >>   Interim  history/subjective:  Pt transferred to ICU for hypotension  Tmax 105.1 > currently 98.3  Glucose range 103-130 I/O 1.2L UOP, 2.4L positive   Objective   Blood pressure (!) 76/52, pulse 83, temperature 98.1 F (36.7 C), temperature source Rectal, resp. rate 12, SpO2 96 %.        Intake/Output Summary (Last 24 hours) at 09/30/2019 0820 Last data filed at 09/30/2019 0540 Gross per 24 hour  Intake 3680 ml  Output 1275 ml  Net 2405 ml   There were no vitals filed for this visit.  Examination: General: adult male lying in bed in NAD, appears sleepy  HEENT: MM pink/dry, no jvd, pupils reactive Neuro: awakens, alert, oriented to self / place, time / events.  MAE / normal strength CV: s1s2 rrr, no m/r/g PULM:  Non-labored, lungs bilaterally clear  GI: soft, bsx4 active, non-tender to palpation  GU: dried bloody secretions on foley, foley with yellow urine  Extremities: warm/dry, no edema  Skin: no rashes or lesions  Resolved Hospital Problem list      Assessment & Plan:   Septic Shock in setting of UTI Recent UC 5/24 with corynebacterium.  Pt pulled chronic indwelling foley out.  PCT 27.  -transfer to ICU -LR 500 ml bolus now, then 125 ml/hr  -vasopressors as needed to support MAP >65  -continue peripheral levophed for now (on 82mcg's), hopeful to wean to off  -abx as above, cefepime D2/x -follow cultures to maturity -trend  PCT, lactic acid   Chronic Indwelling Foley  Reported Neurogenic Bladder Reported neurogenic bladder, chronic foley - no hx of birth defects or surgical interventions.  ? If this is related to his baseline PSY medications / possible urinary retention.  Pt pulled out foley prior to admit  -continue foley for now -strict I/O's  -follow bloody drainage from foley, ensure no clot / obstruction.  Hypokalemia  -follow electrolytes, replace as indicated  -KCL 50 mEq IV 6/14  Anemia -trend CBC -MVI   Bipolar Disorder Schizophrenia  -continue abilify,  cogentin, depakote, risperdal -PRN oxycodone, xanax, trazodone  Reported COPD / Asthma  No PFT's on file -continue dulera  Best practice:  Diet: As tolerated  Pain/Anxiety/Delirium protocol (if indicated): n/a  VAP protocol (if indicated): n/a  DVT prophylaxis: heparin SQ GI prophylaxis: protonix  Glucose control: n/a  Mobility: As tolerated  Code Status: Full Code  Family Communication: Patient updated on plan of care 6/14 Disposition: ICU   Labs   CBC: Recent Labs  Lab 09/29/19 1815 09/30/19 0521  WBC 10.9* 11.2*  HGB 12.4* 10.3*  HCT 40.0 32.6*  MCV 89.7 88.6  PLT 278 947    Basic Metabolic Panel: Recent Labs  Lab 09/29/19 1815 09/30/19 0521  NA 135 134*  K 4.1 3.2*  CL 99 103  CO2 26 22  GLUCOSE 103* 130*  BUN 22* 19  CREATININE 1.16 0.86  CALCIUM 9.0 8.1*   GFR: Estimated Creatinine Clearance: 111 mL/min (by C-G formula based on SCr of 0.86 mg/dL). Recent Labs  Lab 09/29/19 1815 09/30/19 0154 09/30/19 0521  PROCALCITON  --   --  27.02  WBC 10.9*  --  11.2*  LATICACIDVEN  --  1.7 2.0*    Liver Function Tests: Recent Labs  Lab 09/29/19 1815  AST 22  ALT 22  ALKPHOS 114  BILITOT 0.7  PROT 6.7  ALBUMIN 3.3*   No results for input(s): LIPASE, AMYLASE in the last 168 hours. No results for input(s): AMMONIA in the last 168 hours.  ABG No results found for: PHART, PCO2ART, PO2ART, HCO3, TCO2, ACIDBASEDEF, O2SAT   Coagulation Profile: Recent Labs  Lab 09/30/19 0521  INR 1.3*    Cardiac Enzymes: No results for input(s): CKTOTAL, CKMB, CKMBINDEX, TROPONINI in the last 168 hours.  HbA1C: No results found for: HGBA1C  CBG: No results for input(s): GLUCAP in the last 168 hours.  Review of Systems:   Gen: Denies fever, chills, weight change, fatigue, night sweats HEENT: Denies blurred vision, double vision, hearing loss, tinnitus, sinus congestion, rhinorrhea, sore throat, neck stiffness, dysphagia PULM: Denies shortness of breath,  cough, sputum production, hemoptysis, wheezing CV: Denies chest pain, edema, orthopnea, paroxysmal nocturnal dyspnea, palpitations GI: Denies abdominal pain, nausea, vomiting, diarrhea, hematochezia, melena, constipation, change in bowel habits GU: Denies dysuria, hematuria, polyuria, oliguria, urethral discharge Endocrine: Denies hot or cold intolerance, polyuria, polyphagia or appetite change Derm: Denies rash, dry skin, scaling or peeling skin change Heme: Denies easy bruising, bleeding, bleeding gums Neuro: Denies headache, numbness, weakness, slurred speech, loss of memory or consciousness  Past Medical History  He,  has a past medical history of Asthma, Bipolar 1 disorder ([place]), COPD (chronic obstructive pulmonary disease) ([place]), GERD (gastroesophageal reflux disease), and Schizo affective schizophrenia ([place]).   Surgical History    Past Surgical History:  Procedure Laterality Date  . CARPAL TUNNEL RELEASE  02/27/2012   Procedure: CARPAL TUNNEL RELEASE;  Surgeon: Schuyler Amor, MD;  Location: [facility];  Service: Orthopedics;  Laterality: Left;  . OPEN REDUCTION INTERNAL FIXATION (ORIF) DISTAL RADIAL FRACTURE  02/27/2012   Procedure: OPEN REDUCTION INTERNAL FIXATION (ORIF) DISTAL RADIAL FRACTURE;  Surgeon: Schuyler Amor, MD;  Location: [place] [place];  Service: Orthopedics;  Laterality: Left;  Open reduction internal fixation of  left distal radius fracture.      Social History   reports that he has quit smoking. He has never used smokeless tobacco. He reports that he does not drink alcohol and does not use drugs.   Family History   His family history is not on file.   Allergies Allergies  Allergen Reactions  . Penicillins Anaphylaxis     Home Medications  Prior to Admission medications   Medication Sig Start Date End Date Taking? Authorizing Provider  albuterol (PROVENTIL HFA;VENTOLIN HFA) 108 (90 BASE) MCG/ACT inhaler Inhale 2 puffs into the lungs every 4 (four) hours as  needed for shortness of breath.    Yes [provider]  ALPRAZolam (XANAX) 0.25 MG tablet Take 1 tablet (0.25 mg total) by mouth 2 (two) times daily as needed for anxiety. 09/12/19  Yes [place] Eric J, DO  ARIPiprazole (ABILIFY) 10 MG tablet Take 10 mg by mouth in the morning and at bedtime.    Yes [provider]  aspirin EC 81 MG tablet Take 81 mg by mouth in the morning and at bedtime. Swallow whole.   Yes [provider]  benztropine (COGENTIN) 0.5 MG tablet Take 0.5 mg by mouth 2 (two) times daily.   Yes [provider]  Calcium Carb-Cholecalciferol (CALCIUM 500 +D PO) Take 1 tablet by mouth daily.   Yes [provider]  divalproex (DEPAKOTE) 125 MG DR tablet Take 125 mg by mouth 3 (three) times daily.   Yes [provider]  ferrous sulfate (FEROSUL) 325 (65 FE) MG tablet Take 325 mg by mouth 2 (two) times daily with a meal.   Yes [provider]  Fluticasone-Salmeterol (ADVAIR) 250-50 MCG/DOSE AEPB Inhale 1 puff into the lungs every 12 (twelve) hours.   Yes [provider]  meloxicam (MOBIC) 7.5 MG tablet Take 7.5 mg by mouth daily.   Yes [provider]  Multiple Vitamins-Minerals (CEROVITE SENIOR) TABS Take 1 tablet by mouth daily.   Yes [provider]  OLANZapine (ZYPREXA) 10 MG tablet Take 10 mg by mouth 2 (two) times daily.    Yes [provider]  oxyCODONE (OXY IR/ROXICODONE) 5 MG immediate release tablet Take 1 tablet (5 mg total) by mouth every 6 (six) hours as needed for moderate pain. 09/12/19 10/12/19 Yes [place] Eric J, DO  pantoprazole (PROTONIX) 40 MG tablet Take 40 mg by mouth daily.   Yes [provider]  risperidone (RISPERDAL) 4 MG tablet Take 4 mg by mouth 2 (two) times daily.    Yes [provider]  senna-docusate (SENOKOT-S) 8.6-50 MG tablet Take 1 tablet by mouth 2 (two) times daily as needed for constipation. 08/23/19  Yes [provider]  tamsulosin  (FLOMAX) 0.4 MG CAPS capsule Take 0.4 mg by mouth daily.   Yes [provider]  traZODone (DESYREL) 50 MG tablet Take 50 mg by mouth at bedtime as needed for sleep.    Yes [provider]     Critical care time: 8 minutes      Noe Gens, MSN, NP-C [facility] 09/30/2019, 8:20 AM   Please see [email or web address] for pager details.

## 2019-09-30 NOTE — Progress Notes (Signed)
B.P. 71/47 MAP [place] aware and Dr Cyndia Skeeters text paged

## 2019-09-30 NOTE — Progress Notes (Signed)
Patient received via stretcher with E.R. nurse . Aert and orin. To room . R.N. into assess patient. See V.S.S. flow sheet for all vital signs. . Patient is orin. To name place not date and situation. No pain.

## 2019-09-30 NOTE — Progress Notes (Signed)
B.P. now 86/56 patient more awake but sleepy H.R. 81 MAP 67 Dr Alcario Drought made aware and call return cont. To monitor V.S. and call if B.P. gets any lower. For not monitor patient.

## 2019-09-30 NOTE — Progress Notes (Signed)
B.P. 80/58 MAP 66 T- 96.9 rn aware and Dr Alcario Drought call awaiting return call

## 2019-09-30 NOTE — Progress Notes (Addendum)
Have been having problems getting B.P. equip. to work. Change cables and bp cuff. Still not working. Got probable B.P. machine  And now getting accurate B.P.'s . At 02:35 B.P. 85/55 MAP-65 Patient sleeping in bed setting up. Temp. coming down 100.5 rectal P-110, R-19 rm air sat 100%. R.N aware and came to see patient.

## 2019-09-30 NOTE — Progress Notes (Signed)
Dr .Alcario Drought aware of b.p. 94/60 after fl. Bolus no orders given at this time

## 2019-09-30 NOTE — Progress Notes (Signed)
Dr Alcario Drought aware of b.p. 78/58 map 66. Enc. Me to wake patient up and get another bp

## 2019-09-30 NOTE — Progress Notes (Signed)
Fever and tachycardia completely resolved at this time.  BP up to map of 67.  Will just continue the LR at 125 for now.  Think portion of low BP due to sleeping / numerous psych meds patient takes.  Repeat lactate is being drawn right now.

## 2019-10-01 ENCOUNTER — Encounter (HOSPITAL_COMMUNITY): Payer: Self-pay | Admitting: *Deleted

## 2019-10-01 DIAGNOSIS — N390 Urinary tract infection, site not specified: Secondary | ICD-10-CM

## 2019-10-01 LAB — CBC
HCT: 30.2 % — ABNORMAL LOW (ref 39.0–52.0)
Hemoglobin: 9.6 g/dL — ABNORMAL LOW (ref 13.0–17.0)
MCH: 28.1 pg (ref 26.0–34.0)
MCHC: 31.8 g/dL (ref 30.0–36.0)
MCV: 88.3 fL (ref 80.0–100.0)
Platelets: 173 10*3/uL (ref 150–400)
RBC: 3.42 MIL/uL — ABNORMAL LOW (ref 4.22–5.81)
RDW: 14.2 % (ref 11.5–15.5)
WBC: 8.1 10*3/uL (ref 4.0–10.5)
nRBC: 0 % (ref 0.0–0.2)

## 2019-10-01 LAB — COMPREHENSIVE METABOLIC PANEL
ALT: 29 U/L (ref 0–44)
AST: 28 U/L (ref 15–41)
Albumin: 2.2 g/dL — ABNORMAL LOW (ref 3.5–5.0)
Alkaline Phosphatase: 94 U/L (ref 38–126)
Anion gap: 7 (ref 5–15)
BUN: 12 mg/dL (ref 6–20)
CO2: 22 mmol/L (ref 22–32)
Calcium: 7.6 mg/dL — ABNORMAL LOW (ref 8.9–10.3)
Chloride: 106 mmol/L (ref 98–111)
Creatinine, Ser: 0.89 mg/dL (ref 0.61–1.24)
GFR calc Af Amer: >60 mL/min (ref >60)
GFR calc non Af Amer: >60 mL/min (ref >60)
Glucose, Bld: 117 mg/dL — ABNORMAL HIGH (ref 70–99)
Potassium: 3.9 mmol/L (ref 3.5–5.1)
Sodium: 135 mmol/L (ref 135–145)
Total Bilirubin: 0.2 mg/dL — ABNORMAL LOW (ref 0.3–1.2)
Total Protein: 5.1 g/dL — ABNORMAL LOW (ref 6.5–8.1)

## 2019-10-01 LAB — MAGNESIUM: Magnesium: 2 mg/dL (ref 1.7–2.4)

## 2019-10-01 MED ORDER — SODIUM CHLORIDE 0.9 % IV SOLN
2.0000 g | Freq: Three times a day (TID) | INTRAVENOUS | Status: AC
  Administered 2019-10-01 – 2019-10-08 (×23): 2 g via INTRAVENOUS
  Filled 2019-10-01 (×23): qty 2

## 2019-10-01 MED ORDER — VANCOMYCIN HCL 1500 MG/300ML IV SOLN
1500.0000 mg | Freq: Two times a day (BID) | INTRAVENOUS | Status: AC
  Administered 2019-10-01 – 2019-10-11 (×20): 1500 mg via INTRAVENOUS
  Filled 2019-10-01 (×21): qty 300

## 2019-10-01 NOTE — Progress Notes (Signed)
MSW Intern received call from CM noting that pt's family member, Holley Raring, had concerns about the facility pt was at, and wanted to switch. A voicemail was left for her to call back when she was able. SW will continue to follow.

## 2019-10-01 NOTE — Progress Notes (Signed)
Pharmacy Antibiotic Note  Lee House is a 54 y.o. male admitted on 09/29/2019 with sepsis.  Pharmacy has been consulted for Vancomycin dosing.  Patient's sepsis is likely due to a urinary tract source. Urine culture has come back with Pseudomonas and Enterococcus faecalis.  Patient currently on cefepime alone. Due to a significant penicillin allergy, will need to utilize vancomycin for enterococcus coverage.  CrCl 107 ml/min.  Plan: Continue Cefepime 2 gms IV q8hr x 10 days Start Vancomycin 1500 mg IV q12hr x 10 days Monitor renal function, clinicqal status and sensitivities.   Temp (24hrs), Avg:98.2 F (36.8 C), Min:97.8 F (36.6 C), Max:98.6 F (37 C)  Recent Labs  Lab 09/29/19 1815 09/30/19 0154 09/30/19 0521 09/30/19 0812 10/01/19 0510  WBC 10.9*  --  11.2*  --  8.1  CREATININE 1.16  --  0.86  --  0.89  LATICACIDVEN  --  1.7 2.0* 1.4  --     Estimated Creatinine Clearance: 107.2 mL/min (by C-G formula based on SCr of 0.89 mg/dL).    Allergies  Allergen Reactions  . Penicillins Anaphylaxis    Antimicrobials this admission: Cefepime 6/13 >>  Vanc 6/15 >>   Thank you for allowing pharmacy to be a part of this patient's care.  Alanda Slim, PharmD, [facility] Clinical Pharmacist Please see AMION for all Pharmacists' Contact Phone Numbers 10/01/2019, 12:56 PM

## 2019-10-01 NOTE — TOC Initial Note (Signed)
Transition of Care [facility]) - Initial/Assessment Note    Patient Details  Name: Lee House MRN: [id number] Date of Birth: Oct 12, 1965  Transition [facility]) CM/SW Contact:    Kirstie Peri, [place] Work Phone Number: 10/01/2019, 2:22 PM  Clinical Narrative:                 MSW Intern received a call from pt's uncle, He noted that he wanted to speak with someone to make a care plan. He noted that they are very happy with [facility] and would not mind for pt to return there after discharge. His concern was about what to do after SNF and wanted to discuss long term care. He stated that pt had been in a group home, but they could not take him back with any medical issues. He had no preference for where the pt is placed, but did note that pt's doctors are in [place].  He asked to meet with a social worker to discuss options so that he could also include other family members as well. He was given the information for the social worker who will have this pt on Thursday and requested a meeting between 1 and 3. SW will continue to follow.    Barriers to Discharge: Continued Medical Work up   Patient Goals and CMS Choice Patient states their goals for this hospitalization and ongoing recovery are:: Pt unable to participate in goal setting due to disorientation.      Expected Discharge Plan and Services         Living arrangements for the past 2 months: [place]                                      Prior Living Arrangements/Services Living arrangements for the past 2 months: [place] Lives with:: Facility Resident Patient language and need for interpreter reviewed:: Yes Do you feel safe going back to the place where you live?: Yes      Need for Family Participation in Patient Care: Yes (Comment) Care giver support system in place?: Yes (comment)   Criminal Activity/Legal Involvement Pertinent to Current  Situation/Hospitalization: Yes - Comment as needed  Activities of Daily Living      Permission Sought/Granted Permission sought to share information with : Family Supports Permission granted to share information with : Yes, Verbal Permission Granted  Share Information with NAME: Lee House     Permission granted to share info w Relationship: Uncle  Permission granted to share info w Contact Information: [phone]  Emotional Assessment Appearance:: Other (Comment Required (Unable to Assess) Attitude/Demeanor/Rapport: Unable to Assess Affect (typically observed): Unable to Assess Orientation: : Oriented to Self, Oriented to Place Alcohol / Substance Use: Not Applicable Psych Involvement: No (comment)  Admission diagnosis:  Lower urinary tract infectious disease [N39.0] Neurogenic bladder [N31.9] Catheter-associated urinary tract infection ([place]) [T83.511A, N39.0] Sepsis ([place]) [A41.9] Patient Active Problem List   Diagnosis Date Noted  . Sepsis ([place]) 09/30/2019  . Catheter-associated urinary tract infection ([place]) 09/29/2019  . Neurogenic bladder 09/29/2019  . AMS (altered mental status) 09/09/2019  . Acute lower UTI 09/09/2019  . Schizophrenia ([place]) 09/09/2019  . COPD with acute bronchitis ([place]) 09/09/2019  . Distal radial fracture 02/27/2012  . Anemia 06/02/2011  . Cellulitis and abscess of leg 06/01/2011  . Altered mental status 06/01/2011  . Thrombocytopenia ([place]) 06/01/2011   PCP:  Patient, [name] Per Pharmacy:   [place], [place] [address] Phone: [phone] Fax: [phone]     Social Determinants of Health (SDOH) Interventions    Readmission Risk Interventions Readmission Risk Prevention Plan 09/12/2019  Post Dischage Appt Complete  Medication Screening Complete  Transportation Screening Complete  Some recent data might be hidden

## 2019-10-01 NOTE — Consult Note (Deleted)
NAME:  Lee House, MRN:  [id number], DOB:  [birth year], LOS: 1 ADMISSION DATE:  09/29/2019, CONSULTATION DATE:  6/14 REFERRING MD:  Dr. Cyndia Skeeters, CHIEF COMPLAINT:  Hypotension    Brief History   54 y/o M admitted 6/13 after pulling out chronic foley with urosepsis (corynebacterium).    History of present illness   54 y/o M who presented to [facility] on 6/12 from [facility] after removing his foley catheter. The patient was admitted to [facility] for rehab after a broken femur s/p IM nail.  He initially refused replacement of the foley and was to return to the SNF but the facility would not take him without a foley.  He urinated and was sent back to the SNF.  He returned on 6/13 to the ER from the SNF with reports of altered mental status.  He was found to be hypotensive with an oral temperature of 100.1.  Foley catheter was placed with 2.7L of UOP.  UA was concerning for UTI (large hgb, mod leuks, nitirte posi, >300 protein, many bacteria, >50 WBC) and he was admitted for treatment of possible urosepsis with IVF and rocephin.  Recent (5/24) urine culture grew corynebacterium. He later developed fever of 105.1 and was given tylenol, cooling blanket and renal US was ordered.  ABX were adjusted to cefepime.  Renal US was negative for hydronephrosis.  Overnight 6/14, he had ongoing low blood pressure and altered mental status.    PCCM consulted for evaluation of urosepsis.    Past Medical History  Schizophrenia with paranoid tendencies Bipolar Disorder  COPD / Asthma  Neurogenic Bladder with chronic Foley  Significant Hospital Events   6/13 Admit with possible urosepsis   Consults:    Procedures:    Significant Diagnostic Tests:  Renal US 6/14 >> normal appearing kidneys, wall thickening in the bladder consistent with the given clinical hisotry  Micro Data:  COVID 6/13 >> negative  BCx2 6/13 >>  Urine 6/14: gnr  Antimicrobials:  Ceftriaxone 6/13 x1  Cefepime 6/13 >>   Interim  history/subjective:  6/15: pt is awake, conversant. Tolerating po and on 51mcg levo this am. bp stable and will attempt to cont to wean.  6/14: Pt transferred to ICU for hypotension  Tmax 105.1 > currently 98.3  Glucose range 103-130 I/O 1.2L UOP, 2.4L positive   Objective   Blood pressure (!) 106/54, pulse (!) 101, temperature 98.6 F (37 C), temperature source Axillary, resp. rate 16, SpO2 96 %.        Intake/Output Summary (Last 24 hours) at 10/01/2019 0925 Last data filed at 10/01/2019 0800 Gross per 24 hour  Intake 5774.98 ml  Output 6600 ml  Net -825.02 ml   There were no vitals filed for this visit.  Examination: General: adult male sitting up in bed in nad HEENT: NCAT, MM pink/dry, no jvd, pupils reactive Neuro:  alert, oriented to self / place, time / events.  MAE / normal strength CV: s1s2 rrr, no m/r/g PULM:  Non-labored, lungs bilaterally clear  GI: soft, bsx4 active, non-tender to palpation  GU: dried bloody secretions on foley, foley with yellow urine  Extremities: warm/dry, no edema  Skin: no rashes or lesions  Resolved Hospital Problem list      Assessment & Plan:   Septic Shock in setting of UTI GNR UTI Recent UC 5/24 with corynebacterium.  Pt pulled chronic indwelling foley out.  PCT 27.  -hopefully will be able to wean off pressor and transfer from ICU if BP  remains stable -decrease IVF to 14ml/hr LR -vasopressors as needed to support MAP >65  -abx as above, cefepime 7 days -follow cultures gnr -lactate resolved  Chronic Indwelling Foley  Reported Neurogenic Bladder Reported neurogenic bladder, chronic foley - no hx of birth defects or surgical interventions.  ? If this is related to his baseline PSY medications / possible urinary retention.  Pt pulled out foley prior to admit  -continue foley for now -strict I/O's    Hypokalemia  -resolved  Anemia -trend CBC -MVI   Bipolar Disorder Schizophrenia  -continue abilify, cogentin, depakote,  risperdal -PRN oxycodone, xanax, trazodone  Reported COPD / Asthma  No PFT's on file -continue dulera  Best practice:  Diet: tolerating Pain/Anxiety/Delirium protocol (if indicated): n/a  VAP protocol (if indicated): n/a  DVT prophylaxis: heparin SQ GI prophylaxis: protonix  Glucose control: n/a  Mobility: As tolerated  Code Status: Full Code  Family Communication: Patient updated on plan of care 6/15 Disposition: ICU ... possible transfer to floor later today  Labs   CBC: Recent Labs  Lab 09/29/19 1815 09/30/19 0521 10/01/19 0510  WBC 10.9* 11.2* 8.1  HGB 12.4* 10.3* 9.6*  HCT 40.0 32.6* 30.2*  MCV 89.7 88.6 88.3  PLT 278 189 741    Basic Metabolic Panel: Recent Labs  Lab 09/29/19 1815 09/30/19 0521 09/30/19 0812 10/01/19 0510  NA 135 134*  --  135  K 4.1 3.2*  --  3.9  CL 99 103  --  106  CO2 26 22  --  22  GLUCOSE 103* 130*  --  117*  BUN 22* 19  --  12  CREATININE 1.16 0.86  --  0.89  CALCIUM 9.0 8.1*  --  7.6*  MG  --   --  1.6* 2.0   GFR: Estimated Creatinine Clearance: 107.2 mL/min (by C-G formula based on SCr of 0.89 mg/dL). Recent Labs  Lab 09/29/19 1815 09/30/19 0154 09/30/19 0521 09/30/19 0812 10/01/19 0510  PROCALCITON  --   --  27.02  --   --   WBC 10.9*  --  11.2*  --  8.1  LATICACIDVEN  --  1.7 2.0* 1.4  --     Liver Function Tests: Recent Labs  Lab 09/29/19 1815 10/01/19 0510  AST 22 28  ALT 22 29  ALKPHOS 114 94  BILITOT 0.7 0.2*  PROT 6.7 5.1*  ALBUMIN 3.3* 2.2*   No results for input(s): LIPASE, AMYLASE in the last 168 hours. No results for input(s): AMMONIA in the last 168 hours.  ABG No results found for: PHART, PCO2ART, PO2ART, HCO3, TCO2, ACIDBASEDEF, O2SAT   Coagulation Profile: Recent Labs  Lab 09/30/19 0521  INR 1.3*    Cardiac Enzymes: No results for input(s): CKTOTAL, CKMB, CKMBINDEX, TROPONINI in the last 168 hours.  HbA1C: No results found for: HGBA1C  CBG: Recent Labs  Lab 09/30/19 0849    GLUCAP 106*    Critical care time: The patient is critically ill with multiple organ systems failure and requires high complexity decision making for assessment and support, frequent evaluation and titration of therapies, application of advanced monitoring technologies and extensive interpretation of multiple databases.  Critical care time 32 mins. This represents my time independent of the NPs time taking care of the pt. This is excluding procedures.    [facility] 10/01/2019, 9:25 AM

## 2019-10-01 NOTE — Progress Notes (Signed)
NAME:  Lee House, MRN:  [id number], DOB:  06/25/1965, LOS: 1 ADMISSION DATE:  09/29/2019, CONSULTATION DATE:  6/14 REFERRING MD:  Dr. Cyndia Skeeters, CHIEF COMPLAINT:  Hypotension    Brief History   54 y/o M admitted 6/13 after pulling out chronic foley with urosepsis (corynebacterium).    History of present illness   54 y/o M who presented to [facility] on 6/12 from [facility] after removing his foley catheter. The patient was admitted to [facility] for rehab after a broken femur s/p IM nail.  He initially refused replacement of the foley and was to return to the SNF but the facility would not take him without a foley.  He urinated and was sent back to the SNF.  He returned on 6/13 to the ER from the SNF with reports of altered mental status.  He was found to be hypotensive with an oral temperature of 100.1.  Foley catheter was placed with 2.7L of UOP.  UA was concerning for UTI (large hgb, mod leuks, nitirte posi, >300 protein, many bacteria, >50 WBC) and he was admitted for treatment of possible urosepsis with IVF and rocephin.  Recent (5/24) urine culture grew corynebacterium. He later developed fever of 105.1 and was given tylenol, cooling blanket and renal US was ordered.  ABX were adjusted to cefepime.  Renal US was negative for hydronephrosis.  Overnight 6/14, he had ongoing low blood pressure and altered mental status.    PCCM consulted for evaluation of urosepsis.    Past Medical History  Schizophrenia with paranoid tendencies Bipolar Disorder  COPD / Asthma  Neurogenic Bladder with chronic Foley  Significant Hospital Events   6/13 Admit with possible urosepsis   Consults:    Procedures:    Significant Diagnostic Tests:  Renal US 6/14 >> normal appearing kidneys, wall thickening in the bladder consistent with the given clinical hisotry  Micro Data:  COVID 6/13 >> negative  BCx2 6/13 >>  Urine 6/14: gnr  Antimicrobials:  Ceftriaxone 6/13 x1  Cefepime 6/13 >>   Interim  history/subjective:  6/15: pt is awake, conversant. Tolerating po and on 42mcg levo this am. bp stable and will attempt to cont to wean.  6/14: Pt transferred to ICU for hypotension  Tmax 105.1 > currently 98.3  Glucose range 103-130 I/O 1.2L UOP, 2.4L positive   Objective   Blood pressure (!) 106/54, pulse (!) 101, temperature 98.6 F (37 C), temperature source Axillary, resp. rate 16, SpO2 96 %.        Intake/Output Summary (Last 24 hours) at 10/01/2019 0934 Last data filed at 10/01/2019 0800 Gross per 24 hour  Intake 5774.98 ml  Output 6600 ml  Net -825.02 ml   There were no vitals filed for this visit.  Examination: General: adult male sitting up in bed in nad HEENT: NCAT, MM pink/dry, no jvd, pupils reactive Neuro:  alert, oriented to self / place, time / events.  MAE / normal strength CV: s1s2 rrr, no m/r/g PULM:  Non-labored, lungs bilaterally clear  GI: soft, bsx4 active, non-tender to palpation  GU: dried bloody secretions on foley, foley with yellow urine  Extremities: warm/dry, no edema  Skin: no rashes or lesions  Resolved Hospital Problem list      Assessment & Plan:   Septic Shock in setting of UTI GNR UTI Recent UC 5/24 with corynebacterium.  Pt pulled chronic indwelling foley out.  PCT 27.  -hopefully will be able to wean off pressor and transfer from ICU if BP  remains stable -decrease IVF to 31ml/hr LR -vasopressors as needed to support MAP >65  -abx as above, cefepime 7 days -follow cultures gnr -lactate resolved  Chronic Indwelling Foley  Reported Neurogenic Bladder Reported neurogenic bladder, chronic foley - no hx of birth defects or surgical interventions.  ? If this is related to his baseline PSY medications / possible urinary retention.  Pt pulled out foley prior to admit  -continue foley for now -strict I/O's    Hypokalemia  -resolved  Anemia -trend CBC -MVI   Bipolar Disorder Schizophrenia  -continue abilify, cogentin, depakote,  risperdal -PRN oxycodone, xanax, trazodone  Reported COPD / Asthma  No PFT's on file -continue dulera  Best practice:  Diet: tolerating Pain/Anxiety/Delirium protocol (if indicated): n/a  VAP protocol (if indicated): n/a  DVT prophylaxis: heparin SQ GI prophylaxis: protonix  Glucose control: n/a  Mobility: As tolerated  Code Status: Full Code  Family Communication: Patient updated on plan of care 6/15 Disposition: ICU ... possible transfer to floor later today  Labs   CBC: Recent Labs  Lab 09/29/19 1815 09/30/19 0521 10/01/19 0510  WBC 10.9* 11.2* 8.1  HGB 12.4* 10.3* 9.6*  HCT 40.0 32.6* 30.2*  MCV 89.7 88.6 88.3  PLT 278 189 641    Basic Metabolic Panel: Recent Labs  Lab 09/29/19 1815 09/30/19 0521 09/30/19 0812 10/01/19 0510  NA 135 134*  --  135  K 4.1 3.2*  --  3.9  CL 99 103  --  106  CO2 26 22  --  22  GLUCOSE 103* 130*  --  117*  BUN 22* 19  --  12  CREATININE 1.16 0.86  --  0.89  CALCIUM 9.0 8.1*  --  7.6*  MG  --   --  1.6* 2.0   GFR: Estimated Creatinine Clearance: 107.2 mL/min (by C-G formula based on SCr of 0.89 mg/dL). Recent Labs  Lab 09/29/19 1815 09/30/19 0154 09/30/19 0521 09/30/19 0812 10/01/19 0510  PROCALCITON  --   --  27.02  --   --   WBC 10.9*  --  11.2*  --  8.1  LATICACIDVEN  --  1.7 2.0* 1.4  --     Liver Function Tests: Recent Labs  Lab 09/29/19 1815 10/01/19 0510  AST 22 28  ALT 22 29  ALKPHOS 114 94  BILITOT 0.7 0.2*  PROT 6.7 5.1*  ALBUMIN 3.3* 2.2*   No results for input(s): LIPASE, AMYLASE in the last 168 hours. No results for input(s): AMMONIA in the last 168 hours.  ABG No results found for: PHART, PCO2ART, PO2ART, HCO3, TCO2, ACIDBASEDEF, O2SAT   Coagulation Profile: Recent Labs  Lab 09/30/19 0521  INR 1.3*    Cardiac Enzymes: No results for input(s): CKTOTAL, CKMB, CKMBINDEX, TROPONINI in the last 168 hours.  HbA1C: No results found for: HGBA1C  CBG: Recent Labs  Lab 09/30/19 0849    GLUCAP 106*    Critical care time: The patient is critically ill with multiple organ systems failure and requires high complexity decision making for assessment and support, frequent evaluation and titration of therapies, application of advanced monitoring technologies and extensive interpretation of multiple databases.  Critical care time 32 mins. This represents my time independent of the NPs time taking care of the pt. This is excluding procedures.    [facility] 10/01/2019, 9:34 AM

## 2019-10-01 NOTE — Progress Notes (Signed)
Report received from RN. Room is ready for pt.

## 2019-10-01 NOTE — Progress Notes (Signed)
Pt remains off levo and stable for transfer from ICU.   I have spoken with [facility] Dr Cyndia Skeeters who will reassume care tomorrow.  Ccm will sign off at that time.

## 2019-10-02 DIAGNOSIS — D649 Anemia, unspecified: Secondary | ICD-10-CM

## 2019-10-02 LAB — URINE CULTURE: Culture: >=100000 — AB

## 2019-10-02 LAB — HEPATIC FUNCTION PANEL
ALT: 30 U/L (ref 0–44)
AST: 24 U/L (ref 15–41)
Albumin: 2.3 g/dL — ABNORMAL LOW (ref 3.5–5.0)
Alkaline Phosphatase: 96 U/L (ref 38–126)
Bilirubin, Direct: <0.1 mg/dL (ref 0.0–0.2)
Total Bilirubin: 0.8 mg/dL (ref 0.3–1.2)
Total Protein: 5.2 g/dL — ABNORMAL LOW (ref 6.5–8.1)

## 2019-10-02 LAB — POTASSIUM: Potassium: 4.3 mmol/L (ref 3.5–5.1)

## 2019-10-02 NOTE — Plan of Care (Signed)
  Problem: Nutrition: Goal: Adequate nutrition will be maintained Outcome: Completed/Met

## 2019-10-02 NOTE — Progress Notes (Signed)
PROGRESS NOTE  Lee House BOF:[id number] DOB: 08/12/1965   PCP: Patient, [name] Per  Patient is from: Nursing home, [facility]  DOA: 09/29/2019 LOS: 2  Brief Narrative / Interim history: 54 year old male with history of schizophrenia/paranoia, bipolar disorder, COPD/asthma and neurogenic bladder with chronic Foley.  Patient presented to [facility] on 6/12 after pulling his Foley catheter but refused to have Foley replaced and discharged back to SNF just to be sent back to ED on 6/13 for Foley placement.  Patient was admitted on ceftriaxone for catheter associated UTI but became febrile and persistently hypotensive despite IV fluid boluses with mental status change.  He was transferred to ICU for vasopressor support.  Antibiotics escalated to cefepime.  While in ICU, urine culture grew Pseudomonas aeruginosa and Enterococcus faecalis.  Patient allergic to penicillin.  Vancomycin added.  Blood cultures were negative.  Patient came off vasopressor remained stable hemodynamically, and transferred back to [facility] on 6/16.  Subjective: Seen and examined earlier this morning.  No major events overnight or this morning.  No complaints.  Denies chest pain, dyspnea or GI symptoms.  Does not seem to be a great historian.  Objective: Vitals:   10/02/19 0240 10/02/19 0522 10/02/19 0820 10/02/19 0930  BP: 101/63 104/69  104/69  Pulse: 71 76  76  Resp: 18 18  16   Temp: 98.7 F (37.1 C) 97.8 F (36.6 C)  98.3 F (36.8 C)  TempSrc:  Oral  Oral  SpO2: 97% 100% 99% 99%    Intake/Output Summary (Last 24 hours) at 10/02/2019 1302 Last data filed at 10/02/2019 1242 Gross per 24 hour  Intake 2937.8 ml  Output 7801 ml  Net -4863.2 ml   There were no vitals filed for this visit.  Examination:  GENERAL: No apparent distress.  Nontoxic. HEENT: MMM.  Vision and hearing grossly intact.  NECK: Supple.  No apparent JVD.  RESP: On room air.  No IWOB.  Fair aeration bilaterally. CVS:  RRR. Heart  sounds normal.  ABD/GI/GU: BS+. Abd soft, NTND.  Indwelling Foley catheter. MSK/EXT:  Moves extremities. No apparent deformity. No edema.  SKIN: no apparent skin lesion or wound NEURO: Awake, alert and oriented x4 except year.  No apparent focal neuro deficit. PSYCH: Calm.  No distress or agitation.  Poor insight.  Procedures:  None  Microbiology summarized: COVID-19 PCR negative. Urine cultures with Pseudomonas aeruginosa and Enterococcus faecalis Blood cultures NGTD MRSA PCR negative.  Assessment & Plan: Severe sepsis with septic shock due to complicated UTI in patient with history of chronic Foley -Urine culture with Pseudomonas aeruginosa and Enterococcus faecalis. -Patient with history of anaphylaxis to penicillin -Continue IV cefepime through 6/22 and IV vancomycin through 6/25.  Acute metabolic encephalopathy-likely due to the above.  Seems to have resolved. -Treat sepsis as above  Neurogenic bladder with chronic indwelling Foley-patient pulled out Foley prior to admission and came back to ED by SNF for replacement -Foley reinserted in ED. Will continue. -Need outpatient follow-up with urology.  Schizophrenia/bipolar disorder-on multiple antipsychotics.  -Continue home antipsychotics -Optimize Mg and K  Chronic COPD/asthma: No PFT on file. -Continue breathing treatments  Normocytic anemia: Stable.         DVT prophylaxis:  heparin injection 5,000 Units Start: 09/30/19 0945 SCDs Start: 09/29/19 2112  Code Status: Full code Family Communication: Patient and/or RN. Available if any question.  Status is: Inpatient  Remains inpatient appropriate because:Unsafe d/c plan and IV treatments appropriate due to intensity of illness or inability to take PO.  Patient will be on IV cefepime through 6/22 and IV vancomycin through 6/25   Dispo: The patient is from: SNF              Anticipated d/c is to: SNF              Anticipated d/c date is: > 3 days               Patient currently is not medically stable to d/c.            Consultants:  PCCM   Sch Meds:  Scheduled Meds: . ARIPiprazole  10 mg Oral BID  . aspirin EC  81 mg Oral BID  . benztropine  0.5 mg Oral BID  . calcium-vitamin D  1 tablet Oral Q breakfast  . Chlorhexidine Gluconate Cloth  6 each Topical Daily  . divalproex  125 mg Oral TID  . ferrous sulfate  325 mg Oral BID WC  . heparin injection (subcutaneous)  5,000 Units Subcutaneous Q8H  . meloxicam  7.5 mg Oral Daily  . mometasone-formoterol  2 puff Inhalation BID  . multivitamin with minerals  1 tablet Oral Daily  . OLANZapine  10 mg Oral BID  . pantoprazole  40 mg Oral Daily  . risperiDONE  4 mg Oral BID   Continuous Infusions: . sodium chloride 10 mL/hr at 10/01/19 1800  . sodium chloride    . ceFEPime (MAXIPIME) IV 2 g (10/02/19 0515)  . lactated ringers 75 mL/hr at 10/01/19 1800  . vancomycin 1,500 mg (10/02/19 0113)   PRN Meds:.Place/Maintain arterial line **AND** sodium chloride, acetaminophen **OR** acetaminophen, albuterol, ALPRAZolam, ibuprofen, ondansetron **OR** ondansetron (ZOFRAN) IV, oxyCODONE, senna-docusate, traZODone  Antimicrobials: Anti-infectives (From admission, onward)   Start     Dose/Rate Route Frequency Ordered Stop   10/01/19 1400  ceFEPIme (MAXIPIME) 2 g in sodium chloride 0.9 % 100 mL IVPB     Discontinue     2 g 200 mL/hr over 30 Minutes Intravenous Every 8 hours 10/01/19 1244 10/08/19 2359   10/01/19 1300  vancomycin (VANCOREADY) IVPB 1500 mg/300 mL     Discontinue     1,500 mg 150 mL/hr over 120 Minutes Intravenous Every 12 hours 10/01/19 1246 10/11/19 1259   09/30/19 1000  ceFEPIme (MAXIPIME) 2 g in sodium chloride 0.9 % 100 mL IVPB  Status:  Discontinued        2 g 200 mL/hr over 30 Minutes Intravenous Every 12 hours 09/29/19 2120 10/01/19 1244   09/29/19 2200  ceFEPIme (MAXIPIME) 2 g in sodium chloride 0.9 % 100 mL IVPB        2 g 200 mL/hr over 30 Minutes Intravenous  Once  09/29/19 2120 09/29/19 2305   09/29/19 2130  ceFEPIme (MAXIPIME) 2 g in sodium chloride 0.9 % 100 mL IVPB  Status:  Discontinued        2 g 200 mL/hr over 30 Minutes Intravenous  Once 09/29/19 2116 09/29/19 2120   09/29/19 2000  cefTRIAXone (ROCEPHIN) 2 g in sodium chloride 0.9 % 100 mL IVPB        2 g 200 mL/hr over 30 Minutes Intravenous  Once 09/29/19 1956 09/29/19 2224       I have personally reviewed the following labs and images: CBC: Recent Labs  Lab 09/29/19 1815 09/30/19 0521 10/01/19 0510  WBC 10.9* 11.2* 8.1  HGB 12.4* 10.3* 9.6*  HCT 40.0 32.6* 30.2*  MCV 89.7 88.6 88.3  PLT 278 189 173   BMP &  GFR Recent Labs  Lab 09/29/19 1815 09/30/19 0521 09/30/19 0812 10/01/19 0510 10/02/19 0641  NA 135 134*  --  135  --   K 4.1 3.2*  --  3.9 4.3  CL 99 103  --  106  --   CO2 26 22  --  22  --   GLUCOSE 103* 130*  --  117*  --   BUN 22* 19  --  12  --   CREATININE 1.16 0.86  --  0.89  --   CALCIUM 9.0 8.1*  --  7.6*  --   MG  --   --  1.6* 2.0  --    Estimated Creatinine Clearance: 107.2 mL/min (by C-G formula based on SCr of 0.89 mg/dL). Liver & Pancreas: Recent Labs  Lab 09/29/19 1815 10/01/19 0510 10/02/19 0641  AST 22 28 24   ALT 22 29 30   ALKPHOS 114 94 96  BILITOT 0.7 0.2* 0.8  PROT 6.7 5.1* 5.2*  ALBUMIN 3.3* 2.2* 2.3*   No results for input(s): LIPASE, AMYLASE in the last 168 hours. No results for input(s): AMMONIA in the last 168 hours. Diabetic: No results for input(s): HGBA1C in the last 72 hours. Recent Labs  Lab 09/30/19 0849  GLUCAP 106*   Cardiac Enzymes: No results for input(s): CKTOTAL, CKMB, CKMBINDEX, TROPONINI in the last 168 hours. No results for input(s): PROBNP in the last 8760 hours. Coagulation Profile: Recent Labs  Lab 09/30/19 0521  INR 1.3*   Thyroid Function Tests: No results for input(s): TSH, T4TOTAL, FREET4, T3FREE, THYROIDAB in the last 72 hours. Lipid Profile: No results for input(s): CHOL, HDL, LDLCALC,  TRIG, CHOLHDL, LDLDIRECT in the last 72 hours. Anemia Panel: No results for input(s): VITAMINB12, FOLATE, FERRITIN, TIBC, IRON, RETICCTPCT in the last 72 hours. Urine analysis:    Component Value Date/Time   COLORURINE RED (A) 09/29/2019 1923   APPEARANCEUR TURBID (A) 09/29/2019 1923   LABSPEC 1.020 09/29/2019 1923   PHURINE 7.0 09/29/2019 1923   GLUCOSEU NEGATIVE 09/29/2019 1923   HGBUR LARGE (A) 09/29/2019 1923   BILIRUBINUR NEGATIVE 09/29/2019 [place] NEGATIVE 09/29/2019 1923   PROTEINUR >300 (A) 09/29/2019 1923   UROBILINOGEN 0.2 05/12/2013 2100   NITRITE POSITIVE (A) 09/29/2019 1923   LEUKOCYTESUR MODERATE (A) 09/29/2019 1923   Sepsis Labs: Invalid input(s): PROCALCITONIN, [place]  Microbiology: Recent Results (from the past 240 hour(s))  Culture, blood (single) w Reflex to ID Panel     Status: None (Preliminary result)   Collection Time: 09/29/19  6:06 PM   Specimen: BLOOD  Result Value Ref Range Status   Specimen Description BLOOD LEFT ANTECUBITAL  Final   Special Requests   Final    BOTTLES DRAWN AEROBIC AND ANAEROBIC Blood Culture adequate volume   Culture   Final    NO GROWTH 3 DAYS Performed at [facility], [address], [place], [place] [address]    Report Status PENDING  Incomplete  SARS Coronavirus 2 by RT PCR (hospital order, performed in [facility] hospital lab) Nasopharyngeal Nasopharyngeal Swab     Status: None   Collection Time: 09/29/19  8:52 PM   Specimen: Nasopharyngeal Swab  Result Value Ref Range Status   SARS Coronavirus 2 NEGATIVE NEGATIVE Final    Comment: (NOTE) SARS-CoV-2 target nucleic acids are NOT DETECTED.  The SARS-CoV-2 RNA is generally detectable in upper and lower respiratory specimens during the acute phase of infection. The lowest concentration of SARS-CoV-2 viral copies this assay can  detect is 250 copies / mL. A negative result does not preclude SARS-CoV-2 infection and should not be used as the sole basis  for treatment or other patient management decisions.  A negative result may occur with improper specimen collection / handling, submission of specimen other than nasopharyngeal swab, presence of viral mutation(s) within the areas targeted by this assay, and inadequate number of viral copies (<250 copies / mL). A negative result must be combined with clinical observations, patient history, and epidemiological information.  Fact Sheet for Patients:   StrictlyIdeas.no  Fact Sheet for Healthcare Providers: BankingDealers.co.za  This test is not yet approved or  cleared by the [place] FDA and has been authorized for detection and/or diagnosis of SARS-CoV-2 by FDA under an Emergency Use Authorization (EUA).  This EUA will remain in effect (meaning this test can be used) for the duration of the COVID-19 declaration under Section 564(b)(1) of the Act, 21 U.S.C. section 360bbb-3(b)(1), unless the authorization is terminated or revoked sooner.  Performed at [facility], [place] [address], [place], [place] [address]   Culture, Urine     Status: Abnormal   Collection Time: 09/30/19  3:43 AM   Specimen: Urine, Catheterized  Result Value Ref Range Status   Specimen Description URINE, CATHETERIZED  Final   Special Requests   Final    NONE Performed at [facility], [place] [address], [place], [place] [address]    Culture (A)  Final    >=100,000 COLONIES/mL PSEUDOMONAS AERUGINOSA >=100,000 COLONIES/mL ENTEROCOCCUS FAECALIS    Report Status 10/02/2019 FINAL  Final   Organism ID, Bacteria PSEUDOMONAS AERUGINOSA (A)  Final   Organism ID, Bacteria ENTEROCOCCUS FAECALIS (A)  Final      Susceptibility   Enterococcus faecalis - MIC*    AMPICILLIN <=2 SENSITIVE Sensitive     NITROFURANTOIN <=16 SENSITIVE Sensitive     VANCOMYCIN 1 SENSITIVE Sensitive     * >=100,000 COLONIES/mL ENTEROCOCCUS FAECALIS   Pseudomonas aeruginosa - MIC*     CEFTAZIDIME <=1 SENSITIVE Sensitive     CIPROFLOXACIN <=0.25 SENSITIVE Sensitive     GENTAMICIN <=1 SENSITIVE Sensitive     IMIPENEM 2 SENSITIVE Sensitive     PIP/TAZO <=4 SENSITIVE Sensitive     CEFEPIME <=1 SENSITIVE Sensitive     * >=100,000 COLONIES/mL PSEUDOMONAS AERUGINOSA  Culture, blood (routine x 2)     Status: None (Preliminary result)   Collection Time: 09/30/19  8:12 AM   Specimen: BLOOD RIGHT ARM  Result Value Ref Range Status   Specimen Description BLOOD RIGHT ARM  Final   Special Requests   Final    BOTTLES DRAWN AEROBIC ONLY Blood Culture adequate volume   Culture   Final    NO GROWTH 2 DAYS Performed at [facility], [address] [place], [place] [address]    Report Status PENDING  Incomplete  Culture, blood (routine x 2)     Status: None (Preliminary result)   Collection Time: 09/30/19  8:13 AM   Specimen: BLOOD RIGHT HAND  Result Value Ref Range Status   Specimen Description BLOOD RIGHT HAND  Final   Special Requests   Final    BOTTLES DRAWN AEROBIC ONLY Blood Culture adequate volume   Culture   Final    NO GROWTH 2 DAYS Performed at [facility], [place] [address], [place], [place] [address]    Report Status PENDING  Incomplete  MRSA PCR Screening     Status: None  Collection Time: 09/30/19  8:50 AM   Specimen: Nasopharyngeal  Result Value Ref Range Status   MRSA by PCR NEGATIVE NEGATIVE Final    Comment:        The GeneXpert MRSA Assay (FDA approved for NASAL specimens only), is one component of a comprehensive MRSA colonization surveillance program. It is not intended to diagnose MRSA infection nor to guide or monitor treatment for MRSA infections. Performed at [facility], [place] [address], [place], [place] [address]     Radiology Studies: No results found.  Nefi Musich T. [place]  If 7PM-7AM, please contact night-coverage [email or web address] Password [email or web address] 10/02/2019, 1:02 PM

## 2019-10-03 LAB — RENAL FUNCTION PANEL
Albumin: 2.4 g/dL — ABNORMAL LOW (ref 3.5–5.0)
Anion gap: 10 (ref 5–15)
BUN: 11 mg/dL (ref 6–20)
CO2: 25 mmol/L (ref 22–32)
Calcium: 8.4 mg/dL — ABNORMAL LOW (ref 8.9–10.3)
Chloride: 106 mmol/L (ref 98–111)
Creatinine, Ser: 0.8 mg/dL (ref 0.61–1.24)
GFR calc Af Amer: >60 mL/min (ref >60)
GFR calc non Af Amer: >60 mL/min (ref >60)
Glucose, Bld: 95 mg/dL (ref 70–99)
Phosphorus: 4.2 mg/dL (ref 2.5–4.6)
Potassium: 4.1 mmol/L (ref 3.5–5.1)
Sodium: 141 mmol/L (ref 135–145)

## 2019-10-03 LAB — VANCOMYCIN, TROUGH: Vancomycin Tr: 18 ug/mL (ref 15–20)

## 2019-10-03 LAB — HEMOGLOBIN AND HEMATOCRIT, BLOOD
HCT: 33.3 % — ABNORMAL LOW (ref 39.0–52.0)
Hemoglobin: 10.2 g/dL — ABNORMAL LOW (ref 13.0–17.0)

## 2019-10-03 LAB — MAGNESIUM: Magnesium: 2 mg/dL (ref 1.7–2.4)

## 2019-10-03 NOTE — Progress Notes (Signed)
Occupational Therapy Evaluation Patient Details Name: Lee House MRN: [id number] DOB: 06-26-1965 Today's Date: 10/03/2019    History of Present Illness Pt is a 54 yo male presenting with septic shock due to complicated UTI in the presence of chronic Foley use. PMH includes: schizophrenia/paranoia, bipolar disorder, COPD/asthma and neurogenic bladder with chronic Foley.   Clinical Impression   Pt inconsistently poor historian. Prior to hospitalization, pt reports residing in a SNF for rehab secondary to recent R hip fx. Pt reports using RW for mobility and requiring supervision/setup for ADLs in SNF. Pt admitted for above and limited by decreased balance, decreased safety awareness, decreased activity tolerance, and increased R hip pain. Today, pt received up in recliner, grandfather present but left as soon as the eval began. Pt did not complain of pain initially but grimaced during functional transfers. Pt very impulsive and distracted throughout OT eval, grabbing at IV lines and moving before being told to move by OT. Mod v/c's for RW/line management while performing ADLs and functional mobility. Pt required min guard for functional transfers/mobility utilizing RW for support. Pt transferred on/off raised commode with min guard and performed perineal/perianal care with Mod I while sitting down. Pt washed hands at sink with min guard for safety. Pt able to don/doff L sock independently sitting in recliner but requires AE to reach R sock. Pt would benefit from continued skilled acute care OT services to address ADLs/IADLs, functional transfers, and safety awareness. Recommend SNF for discharge, return to previous SNF more more rehab. Will continue to follow pt acutely as able.     Follow Up Recommendations  SNF;Supervision/Assistance - 24 hour (return to prior SNF for more rehab)    Equipment Recommendations  Other (comment) (defer to next venue of care)    Recommendations for Other  Services       Precautions / Restrictions Precautions Precautions: Fall Precaution Comments: recent admission for R hip fx Restrictions Other Position/Activity Restrictions: WBAT per Care Everywhere notes      Mobility Bed Mobility Overal bed mobility:  (received up in recliner)             General bed mobility comments:  (received up in recliner)  Transfers Overall transfer level: Needs assistance Equipment used: Rolling walker (2 wheeled);None Transfers: Sit to/from Stand Sit to Stand: Min guard         General transfer comment: min guard for safety while performing sit>standing using RW for support and v/c's for hand placement    Balance Overall balance assessment: Needs assistance Sitting-balance support: Feet supported Sitting balance-Leahy Scale: Good     Standing balance support: Bilateral upper extremity supported;During functional activity Standing balance-Leahy Scale: Fair Standing balance comment: able to static stand without AD, RW for ambulation                           ADL either performed or assessed with clinical judgement   ADL Overall ADL's : Needs assistance/impaired Eating/Feeding: Independent;Sitting Eating/Feeding Details (indicate cue type and reason): pt ate his dinner very quickly while OT present up in recliner Grooming: Wash/dry hands;Min guard;Standing;Cueing for safety Grooming Details (indicate cue type and reason): pt stood at sink with min guard for safety to wash hands after using restroom Upper Body Bathing: Supervision/ safety;Sitting;Standing   Lower Body Bathing: Min guard;Sitting/lateral leans;Sit to/from stand   Upper Body Dressing : Supervision/safety;Sitting;Standing   Lower Body Dressing: Supervision/safety;Sitting/lateral leans Lower Body Dressing Details (indicate cue type and reason):  pt donned/doffed L sock independently but required use of reacher to reach R sock Toilet Transfer: Min guard;Cueing for  safety;Cueing for sequencing;Comfort height toilet;Ambulation;Grab bars;RW Armed forces technical officer Details (indicate cue type and reason): pt transferred on/off raised toilet with min guard for safety and v/c's for hand placement Toileting- Clothing Manipulation and Hygiene: Modified independent;Sitting/lateral lean Toileting - Clothing Manipulation Details (indicate cue type and reason): pt wiped himself with extended time while sitting on raised toilet seat over commode     Functional mobility during ADLs: Min guard;Rolling walker;Cueing for safety;Cueing for sequencing General ADL Comments: pt impulsive, mod v/c's for RW management      Vision Baseline Vision/History: Wears glasses Wears Glasses: At all times Patient Visual Report: No change from baseline       Perception     Praxis      Pertinent Vitals/Pain Pain Assessment: No/denies pain     Hand Dominance  (both)   Extremity/Trunk Assessment Upper Extremity Assessment Upper Extremity Assessment: Overall WFL for tasks assessed   Lower Extremity Assessment Lower Extremity Assessment: Defer to PT evaluation RLE Deficits / Details: Recent R femur fx s/p IMN   Cervical / Trunk Assessment Cervical / Trunk Assessment: Normal   Communication Communication Communication: No difficulties   Cognition Arousal/Alertness: Awake/alert Behavior During Therapy: Agitated;Impulsive Overall Cognitive Status: Impaired/Different from baseline Area of Impairment: Attention;Memory;Following commands;Safety/judgement                 Orientation Level: Disoriented to;Time Current Attention Level: Selective Memory: Decreased short-term memory Following Commands: Follows multi-step commands inconsistently Safety/Judgement: Decreased awareness of deficits;Decreased awareness of safety     General Comments: pt impulsive, pulling at lines and attempting to stand and move without assistance    General Comments  skin intact; vitals [facility]     Exercises     Shoulder Instructions      Home Living Family/patient expects to be discharged to:: Skilled nursing facility                                 Additional Comments: pt reports needing more therapy      Prior Functioning/Environment Level of Independence: Needs assistance  Gait / Transfers Assistance Needed: Pt reports no use of AD unless he is "in too much pain to walk" and then reports he uses a RW ADL's / Homemaking Assistance Needed: states he receives assistance with bathing and dressing, but that he wishes he could do it himself. reports set up assist needed only for dressing, standing up showers with use of grab bar   Comments: Pt unclear historian        OT Problem List: Decreased activity tolerance;Impaired balance (sitting and/or standing);Decreased cognition;Decreased safety awareness;Decreased knowledge of use of DME or AE;Pain      OT Treatment/Interventions: Self-care/ADL training;Therapeutic exercise;Energy conservation;DME and/or AE instruction;Therapeutic activities;Patient/family education    OT Goals(Current goals can be found in the care plan section) Acute Rehab OT Goals Patient Stated Goal: less pain OT Goal Formulation: With patient  OT Frequency: Min 2X/week   Barriers to D/C:            Co-evaluation              AM-PAC OT "6 Clicks" Daily Activity     Outcome Measure Help from another person eating meals?: None Help from another person taking care of personal grooming?: None Help from another person toileting, which includes using toliet, bedpan,  or urinal?: A Little Help from another person bathing (including washing, rinsing, drying)?: A Little Help from another person to put on and taking off regular upper body clothing?: None Help from another person to put on and taking off regular lower body clothing?: A Little 6 Click Score: 21   End of Session Equipment Utilized During Treatment: Gait belt;Rolling  walker Nurse Communication: Mobility status  Activity Tolerance: Patient tolerated treatment well Patient left: in chair;with call bell/phone within reach;with chair alarm set  OT Visit Diagnosis: Unsteadiness on feet (R26.81);Muscle weakness (generalized) (M62.81);Pain Pain - Right/Left: Right Pain - part of body: Leg                Time: 9458-5929 OT Time Calculation (min): 28 min Charges:  OT General Charges $OT Visit: 1 Visit OT Evaluation $OT Eval Moderate Complexity: 1 Mod OT Treatments $Self Care/Home Management : 8-22 mins  Michel Bickers, OTR/L [facility] [phone]  Francesca Jewett 10/03/2019, 7:36 PM

## 2019-10-03 NOTE — Progress Notes (Signed)
Pharmacy Antibiotic Note  Lee House is a 54 y.o. male admitted on 09/29/2019 with sepsis; pt has neurogenic bladder, with Foley catheter.  Pharmacy has been consulted for vancomycin dosing and cefepime dosing.  Patient's sepsis is likely due to a urinary tract source. Urine culture has come back with >100,000 colonies/ml each of Pseudomonas aeruginosa and Enterococcus faecalis. Due to a significant penicillin allergy, will need to utilize vancomycin for enterococcus coverage.  CrCl 107 ml/min.  WBC 8.1 (6/15), afebrile, Scr 0.80, CrCl >100 ml/min (renal function stable)  Steady-state vancomycin trough level prior to the fifth dose of vancomycin 1500 mg IV Q 12 hrs regimen was 18 mg/L, which is within the goal trough range for this pt; renal function stable.  Plan: Continue cefepime 2 gm IV Q 8 hrs X 10 days (through 6/22) Continue vancomycin 1500 mg IV Q 12 hrs x 10 days (through 6/25) (goal vancomycin trough: 15-20 mg/L) Monitor WBC, temp, clinical improvement, cultures, renal function, vancomycin levels as indicated  Temp (24hrs), Avg:98 F (36.7 C), Min:97.8 F (36.6 C), Max:98.4 F (36.9 C)  Recent Labs  Lab 09/29/19 1815 09/30/19 0154 09/30/19 0521 09/30/19 0812 10/01/19 0510 10/03/19 0551 10/03/19 1732  WBC 10.9*  --  11.2*  --  8.1  --   --   CREATININE 1.16  --  0.86  --  0.89 0.80  --   LATICACIDVEN  --  1.7 2.0* 1.4  --   --   --   VANCOTROUGH  --   --   --   --   --   --  18    Estimated Creatinine Clearance: 119.3 mL/min (by C-G formula based on SCr of 0.8 mg/dL).    Allergies  Allergen Reactions  . Penicillins Anaphylaxis    Antimicrobials this admission: Ceftriaxone X 1 6/13 Cefepime 6/13 >>  Vancomycin 6/15 >>   Microbiology results: 6/13 Bld cx: NGTD 6/14 Urine cx: >100,000 colonies/ml each of Pseudomonas aerguinosa (suscept to cefepime), Enterococcus faecalis (suscept to vanc) 6/14 Bld cx X 2:  NGTD 6/14 MRSA PCR: negative  Thank you for  allowing pharmacy to be a part of this patient's care.  Gillermina Hu, PharmD, BCPS, [facility] Clinical pharmacist 10/03/2019, 6:56 PM

## 2019-10-03 NOTE — Progress Notes (Signed)
PROGRESS NOTE  Arkin Imran SWH:[id number] DOB: 1966-04-16   PCP: Patient, [name] Per  Patient is from: Nursing home, [facility]  DOA: 09/29/2019 LOS: 3  Brief Narrative / Interim history: 54 year old male with history of schizophrenia/paranoia, bipolar disorder, COPD/asthma and neurogenic bladder with chronic Foley.  Patient presented to [facility] on 6/12 after pulling his Foley catheter but refused to have Foley replaced and discharged back to SNF just to be sent back to ED on 6/13 for Foley placement.  Patient was admitted on ceftriaxone for catheter associated UTI but became febrile and persistently hypotensive despite IV fluid boluses with mental status change.  He was transferred to ICU for vasopressor support.  Antibiotics escalated to cefepime.  While in ICU, urine culture grew Pseudomonas aeruginosa and Enterococcus faecalis.  Patient allergic to penicillin.  Vancomycin added.  Blood cultures were negative.  Patient came off vasopressor remained stable hemodynamically, and transferred back to [facility] on 6/16.  Subjective: Seen and examined earlier this morning.  No major events overnight of this morning.  No complaints.  Sitting on bedside chair.  Seems to be in a very good spirit.  Objective: Vitals:   10/02/19 1949 10/02/19 2028 10/03/19 0451 10/03/19 0804  BP:  (!) 99/58 109/78 104/69  Pulse:  83 75 85  Resp:  18 18 18   Temp:  97.8 F (36.6 C) 98.4 F (36.9 C)   TempSrc:  Oral    SpO2: 98% 98% 100% 100%    Intake/Output Summary (Last 24 hours) at 10/03/2019 1511 Last data filed at 10/03/2019 1300 Gross per 24 hour  Intake 1917.81 ml  Output 7200 ml  Net -5282.19 ml   There were no vitals filed for this visit.  Examination:  GENERAL: No apparent distress.  Nontoxic. HEENT: MMM.  Vision and hearing grossly intact.  NECK: Supple.  No apparent JVD.  RESP:  No IWOB.  Fair aeration bilaterally. CVS:  RRR. Heart sounds normal.  ABD/GI/GU: BS+. Abd soft, NTND.   Clear looking urine in Foley bag. MSK/EXT:  Moves extremities. No apparent deformity. No edema.  SKIN: no apparent skin lesion or wound NEURO: Awake, alert and oriented fairly.  No apparent focal neuro deficit. PSYCH: Calm. Normal affect.  Limited insight.  Procedures:  None  Microbiology summarized: COVID-19 PCR negative. Urine cultures with Pseudomonas aeruginosa and Enterococcus faecalis Blood cultures NGTD MRSA PCR negative.  Assessment & Plan: Severe sepsis with septic shock due to complicated UTI in patient with history of chronic Foley -Urine culture with Pseudomonas aeruginosa and Enterococcus faecalis. -Patient with history of anaphylaxis to penicillin -Continue IV cefepime through 6/22 and IV vancomycin through 6/25.  Acute metabolic encephalopathy-likely due to the above.  Seems to have resolved. -Treat sepsis as above  Neurogenic bladder with chronic indwelling Foley-patient pulled out Foley prior to admission and came back to ED by SNF for replacement -Foley reinserted in ED. Will continue. -Need outpatient follow-up with urology.  Schizophrenia/bipolar disorder-on multiple antipsychotics.  -Continue home antipsychotics -Optimize Mg and K  Chronic COPD/asthma: No PFT on file. -Continue breathing treatments  Normocytic anemia: Stable. -Check anemia panel         DVT prophylaxis:  heparin injection 5,000 Units Start: 09/30/19 0945 SCDs Start: 09/29/19 2112  Code Status: Full code Family Communication: Patient and/or RN. Available if any question.  Status is: Inpatient  Remains inpatient appropriate because:Unsafe d/c plan and IV treatments appropriate due to intensity of illness or inability to take PO.  Patient will be on IV cefepime  through 6/22 and IV vancomycin through 6/25.    Dispo: The patient is from: SNF              Anticipated d/c is to: SNF              Anticipated d/c date is: > 3 days              Patient currently is not medically  stable to d/c.            Consultants:  PCCM   Sch Meds:  Scheduled Meds: . ARIPiprazole  10 mg Oral BID  . aspirin EC  81 mg Oral BID  . benztropine  0.5 mg Oral BID  . calcium-vitamin D  1 tablet Oral Q breakfast  . Chlorhexidine Gluconate Cloth  6 each Topical Daily  . divalproex  125 mg Oral TID  . ferrous sulfate  325 mg Oral BID WC  . heparin injection (subcutaneous)  5,000 Units Subcutaneous Q8H  . meloxicam  7.5 mg Oral Daily  . mometasone-formoterol  2 puff Inhalation BID  . multivitamin with minerals  1 tablet Oral Daily  . OLANZapine  10 mg Oral BID  . pantoprazole  40 mg Oral Daily  . risperiDONE  4 mg Oral BID   Continuous Infusions: . sodium chloride 10 mL/hr at 10/01/19 1800  . sodium chloride    . ceFEPime (MAXIPIME) IV 2 g (10/03/19 1349)  . lactated ringers 75 mL/hr at 10/01/19 1800  . vancomycin 1,500 mg (10/03/19 6712)   PRN Meds:.Place/Maintain arterial line **AND** sodium chloride, acetaminophen **OR** acetaminophen, albuterol, ALPRAZolam, ibuprofen, ondansetron **OR** ondansetron (ZOFRAN) IV, oxyCODONE, senna-docusate, traZODone  Antimicrobials: Anti-infectives (From admission, onward)   Start     Dose/Rate Route Frequency Ordered Stop   10/01/19 1400  ceFEPIme (MAXIPIME) 2 g in sodium chloride 0.9 % 100 mL IVPB     Discontinue     2 g 200 mL/hr over 30 Minutes Intravenous Every 8 hours 10/01/19 1244 10/08/19 2359   10/01/19 1300  vancomycin (VANCOREADY) IVPB 1500 mg/300 mL     Discontinue     1,500 mg 150 mL/hr over 120 Minutes Intravenous Every 12 hours 10/01/19 1246 10/11/19 1759   09/30/19 1000  ceFEPIme (MAXIPIME) 2 g in sodium chloride 0.9 % 100 mL IVPB  Status:  Discontinued        2 g 200 mL/hr over 30 Minutes Intravenous Every 12 hours 09/29/19 2120 10/01/19 1244   09/29/19 2200  ceFEPIme (MAXIPIME) 2 g in sodium chloride 0.9 % 100 mL IVPB        2 g 200 mL/hr over 30 Minutes Intravenous  Once 09/29/19 2120 09/29/19 2305    09/29/19 2130  ceFEPIme (MAXIPIME) 2 g in sodium chloride 0.9 % 100 mL IVPB  Status:  Discontinued        2 g 200 mL/hr over 30 Minutes Intravenous  Once 09/29/19 2116 09/29/19 2120   09/29/19 2000  cefTRIAXone (ROCEPHIN) 2 g in sodium chloride 0.9 % 100 mL IVPB        2 g 200 mL/hr over 30 Minutes Intravenous  Once 09/29/19 1956 09/29/19 2224       I have personally reviewed the following labs and images: CBC: Recent Labs  Lab 09/29/19 1815 09/30/19 0521 10/01/19 0510 10/03/19 0551  WBC 10.9* 11.2* 8.1  --   HGB 12.4* 10.3* 9.6* 10.2*  HCT 40.0 32.6* 30.2* 33.3*  MCV 89.7 88.6 88.3  --   PLT 278  189 173  --    BMP &GFR Recent Labs  Lab 09/29/19 1815 09/30/19 0521 09/30/19 0812 10/01/19 0510 10/02/19 0641 10/03/19 0551  NA 135 134*  --  135  --  141  K 4.1 3.2*  --  3.9 4.3 4.1  CL 99 103  --  106  --  106  CO2 26 22  --  22  --  25  GLUCOSE 103* 130*  --  117*  --  95  BUN 22* 19  --  12  --  11  CREATININE 1.16 0.86  --  0.89  --  0.80  CALCIUM 9.0 8.1*  --  7.6*  --  8.4*  MG  --   --  1.6* 2.0  --  2.0  PHOS  --   --   --   --   --  4.2   Estimated Creatinine Clearance: 119.3 mL/min (by C-G formula based on SCr of 0.8 mg/dL). Liver & Pancreas: Recent Labs  Lab 09/29/19 1815 10/01/19 0510 10/02/19 0641 10/03/19 0551  AST 22 28 24   --   ALT 22 29 30   --   ALKPHOS 114 94 96  --   BILITOT 0.7 0.2* 0.8  --   PROT 6.7 5.1* 5.2*  --   ALBUMIN 3.3* 2.2* 2.3* 2.4*   No results for input(s): LIPASE, AMYLASE in the last 168 hours. No results for input(s): AMMONIA in the last 168 hours. Diabetic: No results for input(s): HGBA1C in the last 72 hours. Recent Labs  Lab 09/30/19 0849  GLUCAP 106*   Cardiac Enzymes: No results for input(s): CKTOTAL, CKMB, CKMBINDEX, TROPONINI in the last 168 hours. No results for input(s): PROBNP in the last 8760 hours. Coagulation Profile: Recent Labs  Lab 09/30/19 0521  INR 1.3*   Thyroid Function Tests: No results  for input(s): TSH, T4TOTAL, FREET4, T3FREE, THYROIDAB in the last 72 hours. Lipid Profile: No results for input(s): CHOL, HDL, LDLCALC, TRIG, CHOLHDL, LDLDIRECT in the last 72 hours. Anemia Panel: No results for input(s): VITAMINB12, FOLATE, FERRITIN, TIBC, IRON, RETICCTPCT in the last 72 hours. Urine analysis:    Component Value Date/Time   COLORURINE RED (A) 09/29/2019 1923   APPEARANCEUR TURBID (A) 09/29/2019 1923   LABSPEC 1.020 09/29/2019 1923   PHURINE 7.0 09/29/2019 1923   GLUCOSEU NEGATIVE 09/29/2019 1923   HGBUR LARGE (A) 09/29/2019 1923   BILIRUBINUR NEGATIVE 09/29/2019 [place] NEGATIVE 09/29/2019 1923   PROTEINUR >300 (A) 09/29/2019 1923   UROBILINOGEN 0.2 05/12/2013 2100   NITRITE POSITIVE (A) 09/29/2019 1923   LEUKOCYTESUR MODERATE (A) 09/29/2019 1923   Sepsis Labs: Invalid input(s): PROCALCITONIN, [place]  Microbiology: Recent Results (from the past 240 hour(s))  Culture, blood (single) w Reflex to ID Panel     Status: None (Preliminary result)   Collection Time: 09/29/19  6:06 PM   Specimen: BLOOD  Result Value Ref Range Status   Specimen Description BLOOD LEFT ANTECUBITAL  Final   Special Requests   Final    BOTTLES DRAWN AEROBIC AND ANAEROBIC Blood Culture adequate volume   Culture   Final    NO GROWTH 4 DAYS Performed at [facility], [address] [place], [place] [address]    Report Status PENDING  Incomplete  SARS Coronavirus 2 by RT PCR (hospital order, performed in [facility] hospital lab) Nasopharyngeal Nasopharyngeal Swab     Status: None   Collection Time: 09/29/19  8:52 PM   Specimen: Nasopharyngeal Swab  Result Value Ref Range Status   SARS Coronavirus 2 NEGATIVE NEGATIVE Final    Comment: (NOTE) SARS-CoV-2 target nucleic acids are NOT DETECTED.  The SARS-CoV-2 RNA is generally detectable in upper and lower respiratory specimens during the acute phase of infection. The lowest concentration of SARS-CoV-2 viral copies  this assay can detect is 250 copies / mL. A negative result does not preclude SARS-CoV-2 infection and should not be used as the sole basis for treatment or other patient management decisions.  A negative result may occur with improper specimen collection / handling, submission of specimen other than nasopharyngeal swab, presence of viral mutation(s) within the areas targeted by this assay, and inadequate number of viral copies (<250 copies / mL). A negative result must be combined with clinical observations, patient history, and epidemiological information.  Fact Sheet for Patients:   StrictlyIdeas.no  Fact Sheet for Healthcare Providers: BankingDealers.co.za  This test is not yet approved or  cleared by the [place] FDA and has been authorized for detection and/or diagnosis of SARS-CoV-2 by FDA under an Emergency Use Authorization (EUA).  This EUA will remain in effect (meaning this test can be used) for the duration of the COVID-19 declaration under Section 564(b)(1) of the Act, 21 U.S.C. section 360bbb-3(b)(1), unless the authorization is terminated or revoked sooner.  Performed at [facility], [place] [address] [place], [place] [address]   Culture, Urine     Status: Abnormal   Collection Time: 09/30/19  3:43 AM   Specimen: Urine, Catheterized  Result Value Ref Range Status   Specimen Description URINE, CATHETERIZED  Final   Special Requests   Final    NONE Performed at [facility], [place] [address] [place], [place] [address]    Culture (A)  Final    >=100,000 COLONIES/mL PSEUDOMONAS AERUGINOSA >=100,000 COLONIES/mL ENTEROCOCCUS FAECALIS    Report Status 10/02/2019 FINAL  Final   Organism ID, Bacteria PSEUDOMONAS AERUGINOSA (A)  Final   Organism ID, Bacteria ENTEROCOCCUS FAECALIS (A)  Final      Susceptibility   Enterococcus faecalis - MIC*    AMPICILLIN <=2 SENSITIVE Sensitive     NITROFURANTOIN <=16  SENSITIVE Sensitive     VANCOMYCIN 1 SENSITIVE Sensitive     * >=100,000 COLONIES/mL ENTEROCOCCUS FAECALIS   Pseudomonas aeruginosa - MIC*    CEFTAZIDIME <=1 SENSITIVE Sensitive     CIPROFLOXACIN <=0.25 SENSITIVE Sensitive     GENTAMICIN <=1 SENSITIVE Sensitive     IMIPENEM 2 SENSITIVE Sensitive     PIP/TAZO <=4 SENSITIVE Sensitive     CEFEPIME <=1 SENSITIVE Sensitive     * >=100,000 COLONIES/mL PSEUDOMONAS AERUGINOSA  Culture, blood (routine x 2)     Status: None (Preliminary result)   Collection Time: 09/30/19  8:12 AM   Specimen: BLOOD RIGHT ARM  Result Value Ref Range Status   Specimen Description BLOOD RIGHT ARM  Final   Special Requests   Final    BOTTLES DRAWN AEROBIC ONLY Blood Culture adequate volume   Culture   Final    NO GROWTH 3 DAYS Performed at [facility], [address], [place], [place] [address]    Report Status PENDING  Incomplete  Culture, blood (routine x 2)     Status: None (Preliminary result)   Collection Time: 09/30/19  8:13 AM   Specimen: BLOOD RIGHT HAND  Result Value Ref Range Status   Specimen Description BLOOD RIGHT HAND  Final   Special Requests   Final  BOTTLES DRAWN AEROBIC ONLY Blood Culture adequate volume   Culture   Final    NO GROWTH 3 DAYS Performed at [facility], [place] [address], [place], [place] [address]    Report Status PENDING  Incomplete  MRSA PCR Screening     Status: None   Collection Time: 09/30/19  8:50 AM   Specimen: Nasopharyngeal  Result Value Ref Range Status   MRSA by PCR NEGATIVE NEGATIVE Final    Comment:        The GeneXpert MRSA Assay (FDA approved for NASAL specimens only), is one component of a comprehensive MRSA colonization surveillance program. It is not intended to diagnose MRSA infection nor to guide or monitor treatment for MRSA infections. Performed at [facility], [place] [address], [place], [place] [address]     Radiology Studies: No results found.  Brach Birdsall T. [place]  If 7PM-7AM, please contact night-coverage [email or web address] Password [email or web address] 10/03/2019, 3:11 PM

## 2019-10-03 NOTE — Evaluation (Signed)
Physical Therapy Evaluation Patient Details Name: Lee House MRN: [id number] DOB: 07/29/1965 Today's Date: 10/03/2019   History of Present Illness  Pt is a 54 yo male presenting with septic shock due to complicated UTI in the presence of chronic Foley use. PMH includes: schizophrenia/paranoia, bipolar disorder, COPD/asthma and neurogenic bladder with chronic Foley.  Clinical Impression  Pt in bed upon arrival of PT, agreeable to evaluation at this time. Prior to admission the pt was receiving rehab at [facility] due tpo R hip fx in May where he reports he was ambulating with a RW and receiving some assist for bathing and dressing. The pt now presents with limitations in functional mobility, endurance, and stability due to above dx and prior injuries, and will continue to benefit from skilled PT to address these deficits. The pt was able to demo good capacity for transfers during today's session, and completed ambulation in the hallway with use of RW and minG for safety. The pt reports this caused 8/10 pain in his R hip (RN alerted). The pt will continue to benefit from skilled PT to further progress functional endurance, LE strengthening, and dynamic stability to facilitate rehab of R hip fracture as well as to improve safety and independence with mobility prior to d/c. The pt will benefit from return to [facility] to continue rehab following d/c.     Follow Up Recommendations SNF;Supervision/Assistance - 24 hour (return to SNF for continued rehab)    Equipment Recommendations   (defer to post acute)    Recommendations for Other Services       Precautions / Restrictions Precautions Precautions: Fall Precaution Comments: recent admission for R hip fx      Mobility  Bed Mobility Overal bed mobility: Modified Independent    General bed mobility comments: pt long-sitting in bed upon arrival, able to come to sitting EOB without assist or use of bed. good awareness of  lines  Transfers Overall transfer level: Needs assistance Equipment used: Rolling walker (2 wheeled);None Transfers: Sit to/from Stand Sit to Stand: Min guard         General transfer comment: pt able to stand without use of RW, reports increase in pain in R hip, given RW which pt reports reduced pain.  Ambulation/Gait Ambulation/Gait assistance: Min guard Gait Distance (Feet): 75 Feet Assistive device: Rolling walker (2 wheeled) Gait Pattern/deviations: Step-through pattern;Decreased stride length;Antalgic Gait velocity: 0.6 m/s Gait velocity interpretation: 1.31 - 2.62 ft/sec, indicative of limited community ambulator General Gait Details: Min guard for safety, increased antalgic gait pattern towards end. Cues for upper trunk control, upward gaze     Balance Overall balance assessment: Needs assistance Sitting-balance support: Feet supported Sitting balance-Leahy Scale: Good     Standing balance support: Bilateral upper extremity supported Standing balance-Leahy Scale: Fair Standing balance comment: able to static stand without AD, RW for ambulation          Pertinent Vitals/Pain Pain Assessment: 0-10 Pain Score: 8  Pain Location: R hip during ambulation Pain Descriptors / Indicators: Sore Pain Intervention(s): Limited activity within patient's tolerance;Patient requesting pain meds-RN notified;Monitored during session;Repositioned    Home Living Family/patient expects to be discharged to:: Skilled nursing facility                      Prior Function Level of Independence: Needs assistance   Gait / Transfers Assistance Needed: Pt reports no use of AD unless he is "in too much pain to walk" and then reports he uses a  RW  ADL's / Homemaking Assistance Needed: states he receives assistance with bathing and dressing, but that he wishes he could do it himself. reports set up assist needed only for dressing, standing up showers with use of grab bar  Comments:  Pt unclear historian     Hand Dominance   Dominant Hand:  (pt states he can use both)    Extremity/Trunk Assessment   Upper Extremity Assessment Upper Extremity Assessment: Overall WFL for tasks assessed    Lower Extremity Assessment Lower Extremity Assessment: Overall WFL for tasks assessed RLE Deficits / Details: Recent R femur fx s/p IMN    Cervical / Trunk Assessment Cervical / Trunk Assessment: Normal  Communication   Communication: No difficulties  Cognition Arousal/Alertness: Awake/alert Behavior During Therapy: WFL for tasks assessed/performed Overall Cognitive Status: Impaired/Different from baseline Area of Impairment: Memory                     Memory: Decreased short-term memory         General Comments: pt oriented, but unable to remember name of rehab facility      General Comments General comments (skin integrity, edema, etc.): VSS on RA, pt reports he enjoys going on walks in his free time     Assessment/Plan    PT Assessment Patient needs continued PT services  PT Problem List Decreased strength;Decreased activity tolerance;Decreased balance;Decreased mobility;Decreased cognition;Decreased safety awareness       PT Treatment Interventions      PT Goals (Current goals can be found in the Care Plan section)  Acute Rehab PT Goals Patient Stated Goal: go for a walk PT Goal Formulation: With patient Time For Goal Achievement: 10/17/19 Potential to Achieve Goals: Good    Frequency Min 2X/week    AM-PAC PT "6 Clicks" Mobility  Outcome Measure Help needed turning from your back to your side while in a flat bed without using bedrails?: None Help needed moving from lying on your back to sitting on the side of a flat bed without using bedrails?: None Help needed moving to and from a bed to a chair (including a wheelchair)?: A Little Help needed standing up from a chair using your arms (e.g., wheelchair or bedside chair)?: A Little Help  needed to walk in hospital room?: A Little Help needed climbing 3-5 steps with a railing? : A Little 6 Click Score: 20    End of Session Equipment Utilized During Treatment: Gait belt Activity Tolerance: Patient tolerated treatment well Patient left: in chair;with call bell/phone within reach;with chair alarm set Nurse Communication: Mobility status PT Visit Diagnosis: Unsteadiness on feet (R26.81);Other abnormalities of gait and mobility (R26.89);Difficulty in walking, not elsewhere classified (R26.2)    Time: 9604-5409 PT Time Calculation (min) (ACUTE ONLY): 28 min   Charges:   PT Evaluation $PT Eval Moderate Complexity: 1 Mod PT Treatments $Gait Training: 8-22 mins        Karma Ganja, PT, DPT   Acute Rehabilitation Department Pager #: [phone]  Otho Bellows 10/03/2019, 1:59 PM

## 2019-10-04 LAB — RETICULOCYTES
Immature Retic Fract: 13.4 % (ref 2.3–15.9)
RBC.: 3.84 MIL/uL — ABNORMAL LOW (ref 4.22–5.81)
Retic Count, Absolute: 15.7 10*3/uL — ABNORMAL LOW (ref 19.0–186.0)
Retic Ct Pct: <0.4 % — ABNORMAL LOW (ref 0.4–3.1)

## 2019-10-04 LAB — FOLATE: Folate: 18.9 ng/mL (ref >5.9)

## 2019-10-04 LAB — CULTURE, BLOOD (SINGLE)
Culture: NO GROWTH
Special Requests: ADEQUATE

## 2019-10-04 LAB — IRON AND TIBC
Iron: 48 ug/dL (ref 45–182)
Saturation Ratios: 14 % — ABNORMAL LOW (ref 17.9–39.5)
TIBC: 343 ug/dL (ref 250–450)
UIBC: 295 ug/dL

## 2019-10-04 LAB — FERRITIN: Ferritin: 96 ng/mL (ref 24–336)

## 2019-10-04 LAB — VITAMIN B12: Vitamin B-12: 623 pg/mL (ref 180–914)

## 2019-10-04 NOTE — TOC Progression Note (Signed)
Transition of Care [facility]) - Progression Note    Patient Details  Name: Lee House MRN: [id number] Date of Birth: [birth year]  [facility] CM/SW Contact  Sharlet Salina Mila Homer, LCSW Phone Number: 10/04/2019, 5:45 PM  Clinical Narrative:  CSW visited patient's room to talk with him and visitor was present. CSW advised that visitor is his uncle Lysbeth Galas [phone]). CSW explained that confirmation of where he is gong at discharge needed. Joe contacted Richarda Blade (wife of patient's uncle Ervie) and provided her with CSW's phone number.   Received call from Mrs. Thurman Coyer and she confirmed that patient will return to [facility] at discharge. CSW advised that patient has been at [facility] since early May. Mrs. Thurman Coyer requested that attending MD talk with Wille Glaser as they are concerned about the psychotropic meds he will be discharged on.  *Secure chat sent to MD regarding family's request.       Barriers to Discharge: Continued Medical Work up  Expected Discharge Plan and Services         Living arrangements for the past 2 months: [place]                                       Social Determinants of Health (SDOH) Interventions    Readmission Risk Interventions Readmission Risk Prevention Plan 09/12/2019  Post Dischage Appt Complete  Medication Screening Complete  Transportation Screening Complete  Some recent data might be hidden

## 2019-10-04 NOTE — Plan of Care (Signed)
  Problem: Activity: Goal: Risk for activity intolerance will decrease Outcome: Progressing   Problem: Coping: Goal: Level of anxiety will decrease Outcome: Progressing   

## 2019-10-04 NOTE — Progress Notes (Signed)
PROGRESS NOTE  Lee House WJX:[id number] DOB: 03/02/1966   PCP: Patient, [name] Per  Patient is from: Nursing home, [facility]  DOA: 09/29/2019 LOS: 4  Brief Narrative / Interim history: 54 year old male with history of schizophrenia/paranoia, bipolar disorder, COPD/asthma and neurogenic bladder with chronic Foley.  Patient presented to [facility] on 6/12 after pulling his Foley catheter but refused to have Foley replaced and discharged back to SNF just to be sent back to ED on 6/13 for Foley placement.  Patient was admitted on ceftriaxone for catheter associated UTI but became febrile and persistently hypotensive despite IV fluid boluses with mental status change.  He was transferred to ICU for vasopressor support.  Antibiotics escalated to cefepime.  While in ICU, urine culture grew Pseudomonas aeruginosa and Enterococcus faecalis.  Patient allergic to penicillin.  Vancomycin added.  Blood cultures were negative.  Patient came off vasopressor remained stable hemodynamically, and transferred back to [facility] on 6/16.  Subjective: Seen and examined earlier this morning.  No major events overnight of this morning.  No complaints but not a reliable historian.  Sitting up bedside chair.  Denies pain, shortness of breath or GI symptoms.  Objective: Vitals:   10/03/19 1949 10/03/19 2031 10/04/19 0438 10/04/19 0922  BP:  100/65 110/76 113/78  Pulse:  80 65 88  Resp:  18 18   Temp:  98.6 F (37 C) 98.1 F (36.7 C) 98.5 F (36.9 C)  TempSrc:  Oral Oral Oral  SpO2: 100% 100% 99% 100%  Weight:   72 kg     Intake/Output Summary (Last 24 hours) at 10/04/2019 1413 Last data filed at 10/04/2019 1048 Gross per 24 hour  Intake 1227.67 ml  Output 9300 ml  Net -8072.33 ml   Filed Weights   10/04/19 0438  Weight: 72 kg    Examination:  GENERAL: No apparent distress.  Nontoxic. HEENT: MMM.  Vision and hearing grossly intact.  NECK: Supple.  No apparent JVD.  RESP:  No IWOB.  Fair  aeration bilaterally. CVS:  RRR. Heart sounds normal.  ABD/GI/GU: BS+. Abd soft, NTND.  Clear looking urine in Foley MSK/EXT:  Moves extremities. No apparent deformity. No edema.  SKIN: no apparent skin lesion or wound NEURO: Awake, alert and oriented fairly but limited insight.  No apparent focal neuro deficit. PSYCH: Calm.  Limited insight.  Procedures:  None  Microbiology summarized: COVID-19 PCR negative. Urine cultures with Pseudomonas aeruginosa and Enterococcus faecalis Blood cultures NGTD MRSA PCR negative.  Assessment & Plan: Severe sepsis with septic shock due to complicated UTI in patient with history of chronic Foley -Urine culture with Pseudomonas aeruginosa and Enterococcus faecalis. -Continue IV cefepime through 6/22 and IV vancomycin through 6/25.  Anaphylaxis to penicillin.  Acute metabolic encephalopathy-likely due to the above.  Seems to have resolved. -Treat sepsis as above  Neurogenic bladder with chronic indwelling Foley-patient pulled out Foley prior to admission and came back to ED by SNF for replacement -Foley reinserted in ED. Will continue. -Need outpatient follow-up with urology.  Schizophrenia/bipolar disorder-on multiple antipsychotics.  -Continue home antipsychotics -Optimize Mg and K  Chronic COPD/asthma: No PFT on file. -Continue breathing treatments  Iron deficiency anemia: H&H stable.  Iron sat 14 with TIBC of 343 and ferritin of 86. -P.o. ferrous sulfate with bowel regimen         DVT prophylaxis:  heparin injection 5,000 Units Start: 09/30/19 0945 SCDs Start: 09/29/19 2112  Code Status: Full code Family Communication: Patient and/or RN. Available if any question.  Status is: Inpatient  Remains inpatient appropriate because:Unsafe d/c plan and IV treatments appropriate due to intensity of illness or inability to take PO.  Patient will be on IV cefepime through 6/22 and IV vancomycin through 6/25.    Dispo: The patient is from:  SNF              Anticipated d/c is to: SNF              Anticipated d/c date is: > 3 days              Patient currently is not medically stable to d/c.            Consultants:  PCCM   Sch Meds:  Scheduled Meds:  ARIPiprazole  10 mg Oral BID   aspirin EC  81 mg Oral BID   benztropine  0.5 mg Oral BID   calcium-vitamin D  1 tablet Oral Q breakfast   Chlorhexidine Gluconate Cloth  6 each Topical Daily   divalproex  125 mg Oral TID   ferrous sulfate  325 mg Oral BID WC   heparin injection (subcutaneous)  5,000 Units Subcutaneous Q8H   meloxicam  7.5 mg Oral Daily   mometasone-formoterol  2 puff Inhalation BID   multivitamin with minerals  1 tablet Oral Daily   OLANZapine  10 mg Oral BID   pantoprazole  40 mg Oral Daily   risperiDONE  4 mg Oral BID   Continuous Infusions:  sodium chloride 10 mL/hr at 10/01/19 1800   sodium chloride     ceFEPime (MAXIPIME) IV 2 g (10/04/19 0513)   lactated ringers 75 mL/hr at 10/01/19 1800   vancomycin 1,500 mg (10/04/19 0625)   PRN Meds:.Place/Maintain arterial line **AND** sodium chloride, acetaminophen **OR** acetaminophen, albuterol, ALPRAZolam, ibuprofen, ondansetron **OR** ondansetron (ZOFRAN) IV, oxyCODONE, senna-docusate, traZODone  Antimicrobials: Anti-infectives (From admission, onward)   Start     Dose/Rate Route Frequency Ordered Stop   10/01/19 1400  ceFEPIme (MAXIPIME) 2 g in sodium chloride 0.9 % 100 mL IVPB     Discontinue     2 g 200 mL/hr over 30 Minutes Intravenous Every 8 hours 10/01/19 1244 10/08/19 2359   10/01/19 1300  vancomycin (VANCOREADY) IVPB 1500 mg/300 mL     Discontinue     1,500 mg 150 mL/hr over 120 Minutes Intravenous Every 12 hours 10/01/19 1246 10/11/19 1759   09/30/19 1000  ceFEPIme (MAXIPIME) 2 g in sodium chloride 0.9 % 100 mL IVPB  Status:  Discontinued        2 g 200 mL/hr over 30 Minutes Intravenous Every 12 hours 09/29/19 2120 10/01/19 1244   09/29/19 2200  ceFEPIme  (MAXIPIME) 2 g in sodium chloride 0.9 % 100 mL IVPB        2 g 200 mL/hr over 30 Minutes Intravenous  Once 09/29/19 2120 09/29/19 2305   09/29/19 2130  ceFEPIme (MAXIPIME) 2 g in sodium chloride 0.9 % 100 mL IVPB  Status:  Discontinued        2 g 200 mL/hr over 30 Minutes Intravenous  Once 09/29/19 2116 09/29/19 2120   09/29/19 2000  cefTRIAXone (ROCEPHIN) 2 g in sodium chloride 0.9 % 100 mL IVPB        2 g 200 mL/hr over 30 Minutes Intravenous  Once 09/29/19 1956 09/29/19 2224       I have personally reviewed the following labs and images: CBC: Recent Labs  Lab 09/29/19 1815 09/30/19 0521 10/01/19 0510 10/03/19 0551  WBC 10.9* 11.2* 8.1  --   HGB 12.4* 10.3* 9.6* 10.2*  HCT 40.0 32.6* 30.2* 33.3*  MCV 89.7 88.6 88.3  --   PLT 278 189 173  --    BMP &GFR Recent Labs  Lab 09/29/19 1815 09/30/19 0521 09/30/19 0812 10/01/19 0510 10/02/19 0641 10/03/19 0551  NA 135 134*  --  135  --  141  K 4.1 3.2*  --  3.9 4.3 4.1  CL 99 103  --  106  --  106  CO2 26 22  --  22  --  25  GLUCOSE 103* 130*  --  117*  --  95  BUN 22* 19  --  12  --  11  CREATININE 1.16 0.86  --  0.89  --  0.80  CALCIUM 9.0 8.1*  --  7.6*  --  8.4*  MG  --   --  1.6* 2.0  --  2.0  PHOS  --   --   --   --   --  4.2   Estimated Creatinine Clearance: 107.5 mL/min (by C-G formula based on SCr of 0.8 mg/dL). Liver & Pancreas: Recent Labs  Lab 09/29/19 1815 10/01/19 0510 10/02/19 0641 10/03/19 0551  AST 22 28 24   --   ALT 22 29 30   --   ALKPHOS 114 94 96  --   BILITOT 0.7 0.2* 0.8  --   PROT 6.7 5.1* 5.2*  --   ALBUMIN 3.3* 2.2* 2.3* 2.4*   No results for input(s): LIPASE, AMYLASE in the last 168 hours. No results for input(s): AMMONIA in the last 168 hours. Diabetic: No results for input(s): HGBA1C in the last 72 hours. Recent Labs  Lab 09/30/19 0849  GLUCAP 106*   Cardiac Enzymes: No results for input(s): CKTOTAL, CKMB, CKMBINDEX, TROPONINI in the last 168 hours. No results for input(s):  PROBNP in the last 8760 hours. Coagulation Profile: Recent Labs  Lab 09/30/19 0521  INR 1.3*   Thyroid Function Tests: No results for input(s): TSH, T4TOTAL, FREET4, T3FREE, THYROIDAB in the last 72 hours. Lipid Profile: No results for input(s): CHOL, HDL, LDLCALC, TRIG, CHOLHDL, LDLDIRECT in the last 72 hours. Anemia Panel: Recent Labs    10/04/19 0430  VITAMINB12 623  FOLATE 18.9  FERRITIN 96  TIBC 343  IRON 48  RETICCTPCT <0.4*   Urine analysis:    Component Value Date/Time   COLORURINE RED (A) 09/29/2019 1923   APPEARANCEUR TURBID (A) 09/29/2019 1923   LABSPEC 1.020 09/29/2019 1923   PHURINE 7.0 09/29/2019 1923   GLUCOSEU NEGATIVE 09/29/2019 1923   HGBUR LARGE (A) 09/29/2019 1923   BILIRUBINUR NEGATIVE 09/29/2019 [place] NEGATIVE 09/29/2019 1923   PROTEINUR >300 (A) 09/29/2019 1923   UROBILINOGEN 0.2 05/12/2013 2100   NITRITE POSITIVE (A) 09/29/2019 1923   LEUKOCYTESUR MODERATE (A) 09/29/2019 1923   Sepsis Labs: Invalid input(s): PROCALCITONIN, [place]  Microbiology: Recent Results (from the past 240 hour(s))  Culture, blood (single) w Reflex to ID Panel     Status: None   Collection Time: 09/29/19  6:06 PM   Specimen: BLOOD  Result Value Ref Range Status   Specimen Description BLOOD LEFT ANTECUBITAL  Final   Special Requests   Final    BOTTLES DRAWN AEROBIC AND ANAEROBIC Blood Culture adequate volume   Culture   Final    NO GROWTH 5 DAYS Performed at [facility], [address], [place], [place] [address]    Report Status  10/04/2019 FINAL  Final  SARS Coronavirus 2 by RT PCR (hospital order, performed in [facility] hospital lab) Nasopharyngeal Nasopharyngeal Swab     Status: None   Collection Time: 09/29/19  8:52 PM   Specimen: Nasopharyngeal Swab  Result Value Ref Range Status   SARS Coronavirus 2 NEGATIVE NEGATIVE Final    Comment: (NOTE) SARS-CoV-2 target nucleic acids are NOT DETECTED.  The SARS-CoV-2 RNA is generally  detectable in upper and lower respiratory specimens during the acute phase of infection. The lowest concentration of SARS-CoV-2 viral copies this assay can detect is 250 copies / mL. A negative result does not preclude SARS-CoV-2 infection and should not be used as the sole basis for treatment or other patient management decisions.  A negative result may occur with improper specimen collection / handling, submission of specimen other than nasopharyngeal swab, presence of viral mutation(s) within the areas targeted by this assay, and inadequate number of viral copies (<250 copies / mL). A negative result must be combined with clinical observations, patient history, and epidemiological information.  Fact Sheet for Patients:   StrictlyIdeas.no  Fact Sheet for Healthcare Providers: BankingDealers.co.za  This test is not yet approved or  cleared by the [place] FDA and has been authorized for detection and/or diagnosis of SARS-CoV-2 by FDA under an Emergency Use Authorization (EUA).  This EUA will remain in effect (meaning this test can be used) for the duration of the COVID-19 declaration under Section 564(b)(1) of the Act, 21 U.S.C. section 360bbb-3(b)(1), unless the authorization is terminated or revoked sooner.  Performed at [facility], [place] [address] [place], [place] [address]   Culture, Urine     Status: Abnormal   Collection Time: 09/30/19  3:43 AM   Specimen: Urine, Catheterized  Result Value Ref Range Status   Specimen Description URINE, CATHETERIZED  Final   Special Requests   Final    NONE Performed at [facility], [place] [address] [place], [place] [address]    Culture (A)  Final    >=100,000 COLONIES/mL PSEUDOMONAS AERUGINOSA >=100,000 COLONIES/mL ENTEROCOCCUS FAECALIS    Report Status 10/02/2019 FINAL  Final   Organism ID, Bacteria PSEUDOMONAS AERUGINOSA (A)  Final   Organism ID, Bacteria  ENTEROCOCCUS FAECALIS (A)  Final      Susceptibility   Enterococcus faecalis - MIC*    AMPICILLIN <=2 SENSITIVE Sensitive     NITROFURANTOIN <=16 SENSITIVE Sensitive     VANCOMYCIN 1 SENSITIVE Sensitive     * >=100,000 COLONIES/mL ENTEROCOCCUS FAECALIS   Pseudomonas aeruginosa - MIC*    CEFTAZIDIME <=1 SENSITIVE Sensitive     CIPROFLOXACIN <=0.25 SENSITIVE Sensitive     GENTAMICIN <=1 SENSITIVE Sensitive     IMIPENEM 2 SENSITIVE Sensitive     PIP/TAZO <=4 SENSITIVE Sensitive     CEFEPIME <=1 SENSITIVE Sensitive     * >=100,000 COLONIES/mL PSEUDOMONAS AERUGINOSA  Culture, blood (routine x 2)     Status: None (Preliminary result)   Collection Time: 09/30/19  8:12 AM   Specimen: BLOOD RIGHT ARM  Result Value Ref Range Status   Specimen Description BLOOD RIGHT ARM  Final   Special Requests   Final    BOTTLES DRAWN AEROBIC ONLY Blood Culture adequate volume   Culture   Final    NO GROWTH 4 DAYS Performed at [facility], [address], [place], [place] [address]    Report Status PENDING  Incomplete  Culture, blood (routine x 2)  Status: None (Preliminary result)   Collection Time: 09/30/19  8:13 AM   Specimen: BLOOD RIGHT HAND  Result Value Ref Range Status   Specimen Description BLOOD RIGHT HAND  Final   Special Requests   Final    BOTTLES DRAWN AEROBIC ONLY Blood Culture adequate volume   Culture   Final    NO GROWTH 4 DAYS Performed at [facility], [address] [place], [place] [address]    Report Status PENDING  Incomplete  MRSA PCR Screening     Status: None   Collection Time: 09/30/19  8:50 AM   Specimen: Nasopharyngeal  Result Value Ref Range Status   MRSA by PCR NEGATIVE NEGATIVE Final    Comment:        The GeneXpert MRSA Assay (FDA approved for NASAL specimens only), is one component of a comprehensive MRSA colonization surveillance program. It is not intended to diagnose MRSA infection nor to guide or monitor treatment for MRSA  infections. Performed at [facility], [place] [address] [place], [place] [address]     Radiology Studies: No results found.  Katalyn Matin T. [place]  If 7PM-7AM, please contact night-coverage [email or web address] Password [facility] 10/04/2019, 2:13 PM

## 2019-10-05 LAB — CULTURE, BLOOD (ROUTINE X 2)
Culture: NO GROWTH
Culture: NO GROWTH
Special Requests: ADEQUATE
Special Requests: ADEQUATE

## 2019-10-05 LAB — POTASSIUM: Potassium: 4.2 mmol/L (ref 3.5–5.1)

## 2019-10-05 LAB — MAGNESIUM: Magnesium: 2.1 mg/dL (ref 1.7–2.4)

## 2019-10-05 NOTE — Plan of Care (Signed)
  Problem: Activity: Goal: Risk for activity intolerance will decrease Outcome: Progressing   Problem: Coping: Goal: Level of anxiety will decrease Outcome: Progressing   

## 2019-10-05 NOTE — Plan of Care (Signed)
  Problem: Education: Goal: Knowledge of General Education information will improve Description: Including pain rating scale, medication(s)/side effects and non-pharmacologic comfort measures Outcome: Progressing   Problem: Activity: Goal: Risk for activity intolerance will decrease Outcome: Progressing   

## 2019-10-05 NOTE — Consult Note (Signed)
Telepsych Consultation   Reason for Consult: ''Psych medicine review, family Richarda Blade) concern about worsening delusions after his medications were reduced during his last admission in May.'' Referring Physician:  Wendee Beavers, MD Location of Patient: [facility] 5 M Location of Provider: [facility]  Patient Identification: Lee House MRN:  [id number] Principal Diagnosis: Catheter-associated urinary tract infection ([place]) Diagnosis:  Principal Problem:   Catheter-associated urinary tract infection ([place]) Active Problems:   Schizophrenia ([place])   Neurogenic bladder   Sepsis ([place])   Total Time spent with patient: 45 minutes  Subjective:   Lee House is a 54 y.o. male patient admitted with catheter associated UTI.  HPI:   54 year old male with history of schizophrenia/paranoia, Bipolar disorder, COPD/asthma and neurogenic bladder with chronic Foley. Per chart, he was admitted for catheter related UTI for which he is receiving antibiotics. Today, patient is alert, awake, cooperative , reports occasional paranoia:''sometimes I feel paranoid but I am fine today'' but denies psychosis, depression, mood swings and self harming thoughts. It should be noted that it is not uncommon that patient with chronic foley are predispose to recurrent UTI, psychosis and delusions. Based on my evaluation, patient is currently on very high dose of anti-psychotic and I don't think medications reduction in may has anything to do with occasional paranoia. But rather may be due to catheter related UTI.   Past Psychiatric History: As above  Risk to Self:  denies  Risk to Others:  denies Prior Inpatient Therapy:   Prior Outpatient Therapy:    Past Medical History:  Past Medical History:  Diagnosis Date  . Asthma   . Bipolar 1 disorder ([place])   . COPD (chronic obstructive pulmonary disease) ([place])   . GERD (gastroesophageal reflux disease)   . Schizo affective schizophrenia [facility])      Past Surgical History:  Procedure Laterality Date  . CARPAL TUNNEL RELEASE  02/27/2012   Procedure: CARPAL TUNNEL RELEASE;  Surgeon: Schuyler Amor, MD;  Location: [place];  Service: Orthopedics;  Laterality: Left;  . OPEN REDUCTION INTERNAL FIXATION (ORIF) DISTAL RADIAL FRACTURE  02/27/2012   Procedure: OPEN REDUCTION INTERNAL FIXATION (ORIF) DISTAL RADIAL FRACTURE;  Surgeon: Schuyler Amor, MD;  Location: [place];  Service: Orthopedics;  Laterality: Left;  Open reduction internal fixation of  left distal radius fracture.    Family History: History reviewed. No pertinent family history. Family Psychiatric  History:  Social History:  Social History   Substance and Sexual Activity  Alcohol Use No     Social History   Substance and Sexual Activity  Drug Use No    Social History   Socioeconomic History  . Marital status: Single    Spouse name: Not on file  . Number of children: Not on file  . Years of education: Not on file  . Highest education level: Not on file  Occupational History  . Not on file  Tobacco Use  . Smoking status: Former Research scientist (life sciences)  . Smokeless tobacco: Never Used  Substance and Sexual Activity  . Alcohol use: No  . Drug use: No  . Sexual activity: Not on file  Other Topics Concern  . Not on file  Social History Narrative  . Not on file   Social Determinants of Health   Financial Resource Strain:   . Difficulty of Paying Living Expenses:   Food Insecurity:   . Worried About Charity fundraiser in the Last Year:   . [place] in the  Last Year:   Transportation Needs:   . Film/video editor (Medical):   Marland Kitchen Lack of Transportation (Non-Medical):   Physical Activity:   . Days of Exercise per Week:   . Minutes of Exercise per Session:   Stress:   . Feeling of Stress :   Social Connections:   . Frequency of Communication with Friends and Family:   . Frequency of Social Gatherings with Friends and Family:   . Attends Religious Services:    . Active Member of Clubs or Organizations:   . Attends Archivist Meetings:   Marland Kitchen Marital Status:    Additional Social History:    Allergies:   Allergies  Allergen Reactions  . Penicillins Anaphylaxis    Labs:  Results for orders placed or performed during the hospital encounter of 09/29/19 (from the past 48 hour(s))  Vancomycin, trough     Status: None   Collection Time: 10/03/19  5:32 PM  Result Value Ref Range   Vancomycin Tr 18 15 - 20 ug/mL    Comment: Performed at [facility], [address], [place], [place] [address]  Vitamin B12     Status: None   Collection Time: 10/04/19  4:30 AM  Result Value Ref Range   Vitamin B-12 623 180 - 914 pg/mL    Comment: (NOTE) This assay is not validated for testing neonatal or myeloproliferative syndrome specimens for Vitamin B12 levels. Performed at [facility], [place] [address] [place], [place] [address]   Folate     Status: None   Collection Time: 10/04/19  4:30 AM  Result Value Ref Range   Folate 18.9 >5.9 ng/mL    Comment: Performed at [facility] [address] [place], [place] [address]  Iron and TIBC     Status: Abnormal   Collection Time: 10/04/19  4:30 AM  Result Value Ref Range   Iron 48 45 - 182 ug/dL   TIBC 343 250 - 450 ug/dL   Saturation Ratios 14 (L) 17.9 - 39.5 %   UIBC 295 ug/dL    Comment: Performed at [facility], [place] [address], [place], [place] [address]  Ferritin     Status: None   Collection Time: 10/04/19  4:30 AM  Result Value Ref Range   Ferritin 96 24 - 336 ng/mL    Comment: Performed at [facility] [address] [place], [place] [address]  Reticulocytes     Status: Abnormal   Collection Time: 10/04/19  4:30 AM  Result Value Ref Range   Retic Ct Pct <0.4 (L) 0.4 - 3.1 %    Comment: REPEATED TO VERIFY   RBC. 3.84 (L) 4.22 - 5.81 MIL/uL   Retic Count, Absolute 15.7 (L) 19.0 - 186.0 K/uL   Immature Retic Fract 13.4 2.3 - 15.9 %    Comment:  Performed at [facility] [address], [place], [place] [address]  Potassium     Status: None   Collection Time: 10/05/19  8:30 AM  Result Value Ref Range   Potassium 4.2 3.5 - 5.1 mmol/L    Comment: Performed at [facility] [address] [place], [place] [address]  Magnesium     Status: None   Collection Time: 10/05/19  8:30 AM  Result Value Ref Range   Magnesium 2.1 1.7 - 2.4 mg/dL    Comment: Performed at [facility] [address] [place],  [address]  Medications:  Current Facility-Administered Medications  Medication Dose Route Frequency Provider Last Rate Last Admin  . 0.9 %  sodium chloride infusion  250 mL Intravenous Continuous Corey Harold, NP 10 mL/hr at 10/01/19 1800 Rate Verify at 10/01/19 1800  . 0.9 %  sodium chloride infusion   Intra-arterial PRN Corey Harold, NP      . acetaminophen (TYLENOL) tablet 650 mg  650 mg Oral Q6H PRN Etta Quill, DO       Or  . acetaminophen (TYLENOL) suppository 650 mg  650 mg Rectal Q6H PRN Etta Quill, DO   650 mg at 09/29/19 2226  . albuterol (PROVENTIL) (2.5 MG/3ML) 0.083% nebulizer solution 3 mL  3 mL Inhalation Q4H PRN Etta Quill, DO      . ALPRAZolam Duanne Moron) tablet 0.25 mg  0.25 mg Oral BID PRN Etta Quill, DO      . ARIPiprazole (ABILIFY) tablet 10 mg  10 mg Oral BID Jennette Kettle M, DO   10 mg at 10/05/19 1203  . aspirin EC tablet 81 mg  81 mg Oral BID Etta Quill, DO   81 mg at 10/05/19 1205  . benztropine (COGENTIN) tablet 0.5 mg  0.5 mg Oral BID Jennette Kettle M, DO   0.5 mg at 10/05/19 0956  . calcium-vitamin D (OSCAL WITH D) 500-200 MG-UNIT per tablet 1 tablet  1 tablet Oral Q breakfast Etta Quill, DO   1 tablet at 10/05/19 0956  . ceFEPIme (MAXIPIME) 2 g in sodium chloride 0.9 % 100 mL IVPB  2 g Intravenous Q8H Marshall, Janett Billow, DO 200 mL/hr at 10/05/19 0652 2 g at 10/05/19 9476  . Chlorhexidine Gluconate Cloth 2 % PADS 6 each  6 each Topical Daily  Mercy Riding, MD   6 each at 10/05/19 0958  . divalproex (DEPAKOTE) DR tablet 125 mg  125 mg Oral TID Etta Quill, DO   125 mg at 10/05/19 0956  . ferrous sulfate tablet 325 mg  325 mg Oral BID WC Jennette Kettle M, DO   325 mg at 10/05/19 0956  . heparin injection 5,000 Units  5,000 Units Subcutaneous Q8H Ollis, Brandi L, NP   5,000 Units at 10/05/19 0619  . ibuprofen (ADVIL) tablet 800 mg  800 mg Oral Q6H PRN Etta Quill, DO   800 mg at 09/30/19 0135  . lactated ringers infusion   Intravenous Continuous Audria Nine, DO 75 mL/hr at 10/01/19 1800 Rate Verify at 10/01/19 1800  . meloxicam (MOBIC) tablet 7.5 mg  7.5 mg Oral Daily Jennette Kettle M, DO   7.5 mg at 10/05/19 1202  . mometasone-formoterol (DULERA) 200-5 MCG/ACT inhaler 2 puff  2 puff Inhalation BID Etta Quill, DO   2 puff at 10/05/19 5465  . multivitamin with minerals tablet 1 tablet  1 tablet Oral Daily Etta Quill, DO   1 tablet at 10/05/19 1202  . OLANZapine (ZYPREXA) tablet 10 mg  10 mg Oral BID Jennette Kettle M, DO   10 mg at 10/05/19 1204  . ondansetron (ZOFRAN) tablet 4 mg  4 mg Oral Q6H PRN Etta Quill, DO       Or  . ondansetron [facility]) injection 4 mg  4 mg Intravenous Q6H PRN Etta Quill, DO      . oxyCODONE (Oxy IR/ROXICODONE) immediate release tablet 5 mg  5 mg Oral Q6H PRN Etta Quill, DO      . pantoprazole (PROTONIX) EC tablet  40 mg  40 mg Oral Daily Etta Quill, DO   40 mg at 10/05/19 1203  . risperiDONE (RISPERDAL) tablet 4 mg  4 mg Oral BID Etta Quill, DO   4 mg at 10/05/19 0956  . senna-docusate (Senokot-S) tablet 1 tablet  1 tablet Oral BID PRN Etta Quill, DO      . traZODone (DESYREL) tablet 50 mg  50 mg Oral QHS PRN Etta Quill, DO      . vancomycin Alcus Dad) IVPB 1500 mg/300 mL  1,500 mg Intravenous Q12H Audria Nine, DO 150 mL/hr at 10/05/19 0521 1,500 mg at 10/05/19 1583    Musculoskeletal: Strength & Muscle Tone: not tested, pt seen via  tele health [place]: not tested, pt seen via tele health Patient leans: N/A  Psychiatric Specialty Exam: Physical Exam  Review of Systems  Blood pressure 103/66, pulse 75, temperature 97.9 F (36.6 C), temperature source Oral, resp. rate 16, weight 69.9 kg, SpO2 100 %.Body mass index is 20.33 kg/m.  General Appearance: Casual  Eye Contact:  Good  Speech:  Clear and Coherent  Volume:  Normal  Mood:  Euthymic  Affect:  Congruent  Thought Process:  Coherent  Orientation:  Full (Time, Place, and Person)  Thought Content:  Logical  Suicidal Thoughts:  No  Homicidal Thoughts:  No  Memory:  Immediate;   Fair Recent;   Fair Remote;   Fair  Judgement:  Intact  Insight:  Fair  Psychomotor Activity:  Normal  Concentration:  Concentration: Fair and Attention Span: Fair  Recall:  [employer] of Knowledge:  Fair  Language:  Good  Akathisia:  No  Handed:  Right  AIMS (if indicated):     Assets:  Communication Skills Housing Social Support  ADL's: marginal  Cognition:  WNL  Sleep:   fair     Treatment Plan Summary: 54 year old male with history of schizophrenia who is on 3 antipsychotic(Abilify, Olanzapine, Risperdal) all at therapeutic doses. Patient reports being stable, he is receiving antibiotic for the treatment of catheter related UTI. Today, he is calm, alert, awake, cooperative, denies psychosis and delusions. Patient reports occasional paranoia that may not be unrelated to Catheter induced UTI. Based on my evaluation, I do not see the need for anti-psychotic medications to be increased. Actually, an increased might induce adverse reactions which may makes patient's situation worse.   Recommendations: -Consider good indwelling catheter care to prevent UTI that may worsen psychosis and or paranoia -Consider outpatient psychiatric follow up appointment  for anti-psychotic medication management.  -Continue current anti-psychotics as prescribed.  Disposition: No evidence  of imminent risk to self or others at present.   Patient does not meet criteria for psychiatric inpatient admission. Supportive therapy provided about ongoing stressors. Psychiatric service signing out. Re-consult as needed.  This service was provided via telemedicine using a 2-way, interactive audio and video technology.  Names of all persons participating in this telemedicine service and their role in this encounter. Name: Lee House Role: patient  Name: Shella Spearing Role: RN  Name: Corena Pilgrim, MD Role: Psychiatrist    Corena Pilgrim, MD 10/05/2019 12:23 PM

## 2019-10-05 NOTE — Progress Notes (Signed)
PROGRESS NOTE  Lee House UYQ:[id number] DOB: 03/27/1966   PCP: Patient, [name] Per  Patient is from: Nursing home, [facility]  DOA: 09/29/2019 LOS: 5  Brief Narrative / Interim history: 54 year old male with history of schizophrenia/paranoia, bipolar disorder, COPD/asthma and neurogenic bladder with chronic Foley.  Patient presented to [facility] on 6/12 after pulling his Foley catheter but refused to have Foley replaced and discharged back to SNF just to be sent back to ED on 6/13 for Foley placement.  Patient was admitted on ceftriaxone for catheter associated UTI but became febrile and persistently hypotensive despite IV fluid boluses with mental status change.  He was transferred to ICU for vasopressor support.  Antibiotics escalated to cefepime.    While in ICU, urine culture grew Pseudomonas aeruginosa and Enterococcus faecalis.  Patient allergic to penicillin.  Vancomycin added.  Blood cultures were negative.  Patient came off vasopressor remained stable hemodynamically, and transferred back to [facility] on 6/16.  Patient has been stable from medical psych standpoint.   Subjective: Seen and examined earlier this morning.  No major events overnight of this morning.  No complaints.  He denies pain, shortness of breath, GI or UTI symptoms.  Denies audiovisual hallucination or paranoia.  He asked for more soda.  Objective: Vitals:   10/04/19 2144 10/05/19 0520 10/05/19 0828 10/05/19 1021  BP: 110/67 115/81  103/66  Pulse: 84 65  75  Resp: 16 20  16   Temp: 97.8 F (36.6 C) 97.8 F (36.6 C)  97.9 F (36.6 C)  TempSrc: Oral Oral  Oral  SpO2: 100% 100% 99% 100%  Weight: 69.9 kg       Intake/Output Summary (Last 24 hours) at 10/05/2019 1331 Last data filed at 10/05/2019 1200 Gross per 24 hour  Intake 1422 ml  Output 5200 ml  Net -3778 ml   Filed Weights   10/04/19 0438 10/04/19 2144  Weight: 72 kg 69.9 kg    Examination:  GENERAL: No apparent distress.  Nontoxic. HEENT:  MMM.  Vision and hearing grossly intact.  NECK: Supple.  No apparent JVD.  RESP: On room air.  No IWOB.  Fair aeration bilaterally. CVS:  RRR. Heart sounds normal.  ABD/GI/GU: BS+. Abd soft, NTND.  Indwelling Foley. MSK/EXT:  Moves extremities. No apparent deformity. No edema.  SKIN: no apparent skin lesion or wound NEURO: Awake, alert and oriented self, place and situation but not time.  No apparent focal neuro deficit. PSYCH: Calm. Normal affect.  Denies audiovisual hallucination.   Procedures:  None  Microbiology summarized: COVID-19 PCR negative. Urine cultures with Pseudomonas aeruginosa and Enterococcus faecalis Blood cultures NGTD MRSA PCR negative.  Assessment & Plan: Severe sepsis with septic shock due to complicated UTI in patient with history of chronic Foley -Urine culture with Pseudomonas aeruginosa and Enterococcus faecalis. -Continue IV cefepime through 6/22 and IV vancomycin through 6/25.  Anaphylaxis to penicillin.  Acute metabolic encephalopathy-likely due to the above.  Seems to have resolved. -Treat sepsis as above  Neurogenic bladder with chronic indwelling Foley-patient pulled out Foley prior to admission and came back to ED by SNF for replacement -Foley reinserted in ED. Will continue. -Need outpatient follow-up with urology.  Schizophrenia/bipolar disorder-on 3 antipsychotics. Family reports his antipsychotics were reduced when he was hospitalized last month.  Family concerned about worsening delusion after he confused indwelling Foley for snake and pulled it out that led to multiple ED visits and current admission.  Psych consulted and recommended continuing current regimen.  -Appreciate psych input-continue current  regimen -Optimize Mg and K  Chronic COPD/asthma: No PFT on file. -Continue breathing treatments  Iron deficiency anemia: H&H stable.  Iron sat 14 with TIBC of 343 and ferritin of 86. -P.o. ferrous sulfate with bowel regimen         DVT  prophylaxis:  heparin injection 5,000 Units Start: 09/30/19 0945 SCDs Start: 09/29/19 2112  Code Status: Full code Family Communication: Updated Glenda, grandmother over the phone on 6/18.  Did not answer call on 6/19.  Status is: Inpatient  Remains inpatient appropriate because:Unsafe d/c plan and IV treatments appropriate due to intensity of illness or inability to take PO.  Patient will be on IV cefepime through 6/22 and IV vancomycin through 6/25.    Dispo: The patient is from: SNF              Anticipated d/c is to: SNF              Anticipated d/c date is: > 3 days              Patient currently is not medically stable to d/c.            Consultants:  PCCM Psychiatry   Sch Meds:  Scheduled Meds:  ARIPiprazole  10 mg Oral BID   aspirin EC  81 mg Oral BID   benztropine  0.5 mg Oral BID   calcium-vitamin D  1 tablet Oral Q breakfast   Chlorhexidine Gluconate Cloth  6 each Topical Daily   divalproex  125 mg Oral TID   ferrous sulfate  325 mg Oral BID WC   heparin injection (subcutaneous)  5,000 Units Subcutaneous Q8H   meloxicam  7.5 mg Oral Daily   mometasone-formoterol  2 puff Inhalation BID   multivitamin with minerals  1 tablet Oral Daily   OLANZapine  10 mg Oral BID   pantoprazole  40 mg Oral Daily   risperiDONE  4 mg Oral BID   Continuous Infusions:  sodium chloride 10 mL/hr at 10/01/19 1800   sodium chloride     ceFEPime (MAXIPIME) IV 2 g (10/05/19 1884)   lactated ringers 75 mL/hr at 10/01/19 1800   vancomycin 1,500 mg (10/05/19 0521)   PRN Meds:.Place/Maintain arterial line **AND** sodium chloride, acetaminophen **OR** acetaminophen, albuterol, ALPRAZolam, ibuprofen, ondansetron **OR** ondansetron (ZOFRAN) IV, oxyCODONE, senna-docusate, traZODone  Antimicrobials: Anti-infectives (From admission, onward)   Start     Dose/Rate Route Frequency Ordered Stop   10/01/19 1400  ceFEPIme (MAXIPIME) 2 g in sodium chloride 0.9 % 100 mL  IVPB     Discontinue     2 g 200 mL/hr over 30 Minutes Intravenous Every 8 hours 10/01/19 1244 10/08/19 2359   10/01/19 1300  vancomycin (VANCOREADY) IVPB 1500 mg/300 mL     Discontinue     1,500 mg 150 mL/hr over 120 Minutes Intravenous Every 12 hours 10/01/19 1246 10/11/19 1759   09/30/19 1000  ceFEPIme (MAXIPIME) 2 g in sodium chloride 0.9 % 100 mL IVPB  Status:  Discontinued        2 g 200 mL/hr over 30 Minutes Intravenous Every 12 hours 09/29/19 2120 10/01/19 1244   09/29/19 2200  ceFEPIme (MAXIPIME) 2 g in sodium chloride 0.9 % 100 mL IVPB        2 g 200 mL/hr over 30 Minutes Intravenous  Once 09/29/19 2120 09/29/19 2305   09/29/19 2130  ceFEPIme (MAXIPIME) 2 g in sodium chloride 0.9 % 100 mL IVPB  Status:  Discontinued  2 g 200 mL/hr over 30 Minutes Intravenous  Once 09/29/19 2116 09/29/19 2120   09/29/19 2000  cefTRIAXone (ROCEPHIN) 2 g in sodium chloride 0.9 % 100 mL IVPB        2 g 200 mL/hr over 30 Minutes Intravenous  Once 09/29/19 1956 09/29/19 2224       I have personally reviewed the following labs and images: CBC: Recent Labs  Lab 09/29/19 1815 09/30/19 0521 10/01/19 0510 10/03/19 0551  WBC 10.9* 11.2* 8.1  --   HGB 12.4* 10.3* 9.6* 10.2*  HCT 40.0 32.6* 30.2* 33.3*  MCV 89.7 88.6 88.3  --   PLT 278 189 173  --    BMP &GFR Recent Labs  Lab 09/29/19 1815 09/29/19 1815 09/30/19 0521 09/30/19 0812 10/01/19 0510 10/02/19 0641 10/03/19 0551 10/05/19 0830  NA 135  --  134*  --  135  --  141  --   K 4.1   < > 3.2*  --  3.9 4.3 4.1 4.2  CL 99  --  103  --  106  --  106  --   CO2 26  --  22  --  22  --  25  --   GLUCOSE 103*  --  130*  --  117*  --  95  --   BUN 22*  --  19  --  12  --  11  --   CREATININE 1.16  --  0.86  --  0.89  --  0.80  --   CALCIUM 9.0  --  8.1*  --  7.6*  --  8.4*  --   MG  --   --   --  1.6* 2.0  --  2.0 2.1  PHOS  --   --   --   --   --   --  4.2  --    < > = values in this interval not displayed.   Estimated Creatinine  Clearance: 104.4 mL/min (by C-G formula based on SCr of 0.8 mg/dL). Liver & Pancreas: Recent Labs  Lab 09/29/19 1815 10/01/19 0510 10/02/19 0641 10/03/19 0551  AST 22 28 24   --   ALT 22 29 30   --   ALKPHOS 114 94 96  --   BILITOT 0.7 0.2* 0.8  --   PROT 6.7 5.1* 5.2*  --   ALBUMIN 3.3* 2.2* 2.3* 2.4*   No results for input(s): LIPASE, AMYLASE in the last 168 hours. No results for input(s): AMMONIA in the last 168 hours. Diabetic: No results for input(s): HGBA1C in the last 72 hours. Recent Labs  Lab 09/30/19 0849  GLUCAP 106*   Cardiac Enzymes: No results for input(s): CKTOTAL, CKMB, CKMBINDEX, TROPONINI in the last 168 hours. No results for input(s): PROBNP in the last 8760 hours. Coagulation Profile: Recent Labs  Lab 09/30/19 0521  INR 1.3*   Thyroid Function Tests: No results for input(s): TSH, T4TOTAL, FREET4, T3FREE, THYROIDAB in the last 72 hours. Lipid Profile: No results for input(s): CHOL, HDL, LDLCALC, TRIG, CHOLHDL, LDLDIRECT in the last 72 hours. Anemia Panel: Recent Labs    10/04/19 0430  VITAMINB12 623  FOLATE 18.9  FERRITIN 96  TIBC 343  IRON 48  RETICCTPCT <0.4*   Urine analysis:    Component Value Date/Time   COLORURINE RED (A) 09/29/2019 1923   APPEARANCEUR TURBID (A) 09/29/2019 1923   LABSPEC 1.020 09/29/2019 1923   PHURINE 7.0 09/29/2019 [place] NEGATIVE 09/29/2019 1923  HGBUR LARGE (A) 09/29/2019 [place] NEGATIVE 09/29/2019 [place] NEGATIVE 09/29/2019 1923   PROTEINUR >300 (A) 09/29/2019 1923   UROBILINOGEN 0.2 05/12/2013 2100   NITRITE POSITIVE (A) 09/29/2019 1923   LEUKOCYTESUR MODERATE (A) 09/29/2019 1923   Sepsis Labs: Invalid input(s): PROCALCITONIN, [place]  Microbiology: Recent Results (from the past 240 hour(s))  Culture, blood (single) w Reflex to ID Panel     Status: None   Collection Time: 09/29/19  6:06 PM   Specimen: BLOOD  Result Value Ref Range Status   Specimen Description  BLOOD LEFT ANTECUBITAL  Final   Special Requests   Final    BOTTLES DRAWN AEROBIC AND ANAEROBIC Blood Culture adequate volume   Culture   Final    NO GROWTH 5 DAYS Performed at [facility], [address] [place], [place] [address]    Report Status 10/04/2019 FINAL  Final  SARS Coronavirus 2 by RT PCR (hospital order, performed in [facility] hospital lab) Nasopharyngeal Nasopharyngeal Swab     Status: None   Collection Time: 09/29/19  8:52 PM   Specimen: Nasopharyngeal Swab  Result Value Ref Range Status   SARS Coronavirus 2 NEGATIVE NEGATIVE Final    Comment: (NOTE) SARS-CoV-2 target nucleic acids are NOT DETECTED.  The SARS-CoV-2 RNA is generally detectable in upper and lower respiratory specimens during the acute phase of infection. The lowest concentration of SARS-CoV-2 viral copies this assay can detect is 250 copies / mL. A negative result does not preclude SARS-CoV-2 infection and should not be used as the sole basis for treatment or other patient management decisions.  A negative result may occur with improper specimen collection / handling, submission of specimen other than nasopharyngeal swab, presence of viral mutation(s) within the areas targeted by this assay, and inadequate number of viral copies (<250 copies / mL). A negative result must be combined with clinical observations, patient history, and epidemiological information.  Fact Sheet for Patients:   StrictlyIdeas.no  Fact Sheet for Healthcare Providers: BankingDealers.co.za  This test is not yet approved or  cleared by the [place] FDA and has been authorized for detection and/or diagnosis of SARS-CoV-2 by FDA under an Emergency Use Authorization (EUA).  This EUA will remain in effect (meaning this test can be used) for the duration of the COVID-19 declaration under Section 564(b)(1) of the Act, 21 U.S.C. section 360bbb-3(b)(1), unless the  authorization is terminated or revoked sooner.  Performed at [facility], [place] [address], [place], [place] [address]   Culture, Urine     Status: Abnormal   Collection Time: 09/30/19  3:43 AM   Specimen: Urine, Catheterized  Result Value Ref Range Status   Specimen Description URINE, CATHETERIZED  Final   Special Requests   Final    NONE Performed at [facility], [place] [address], [place], [place] [address]    Culture (A)  Final    >=100,000 COLONIES/mL PSEUDOMONAS AERUGINOSA >=100,000 COLONIES/mL ENTEROCOCCUS FAECALIS    Report Status 10/02/2019 FINAL  Final   Organism ID, Bacteria PSEUDOMONAS AERUGINOSA (A)  Final   Organism ID, Bacteria ENTEROCOCCUS FAECALIS (A)  Final      Susceptibility   Enterococcus faecalis - MIC*    AMPICILLIN <=2 SENSITIVE Sensitive     NITROFURANTOIN <=16 SENSITIVE Sensitive     VANCOMYCIN 1 SENSITIVE Sensitive     * >=100,000 COLONIES/mL ENTEROCOCCUS FAECALIS   Pseudomonas aeruginosa - MIC*    CEFTAZIDIME <=1 SENSITIVE Sensitive  CIPROFLOXACIN <=0.25 SENSITIVE Sensitive     GENTAMICIN <=1 SENSITIVE Sensitive     IMIPENEM 2 SENSITIVE Sensitive     PIP/TAZO <=4 SENSITIVE Sensitive     CEFEPIME <=1 SENSITIVE Sensitive     * >=100,000 COLONIES/mL PSEUDOMONAS AERUGINOSA  Culture, blood (routine x 2)     Status: None   Collection Time: 09/30/19  8:12 AM   Specimen: BLOOD RIGHT ARM  Result Value Ref Range Status   Specimen Description BLOOD RIGHT ARM  Final   Special Requests   Final    BOTTLES DRAWN AEROBIC ONLY Blood Culture adequate volume   Culture   Final    NO GROWTH 5 DAYS Performed at [facility], [place] [address], [place], [place] [address]    Report Status 10/05/2019 FINAL  Final  Culture, blood (routine x 2)     Status: None   Collection Time: 09/30/19  8:13 AM   Specimen: BLOOD RIGHT HAND  Result Value Ref Range Status   Specimen Description BLOOD RIGHT HAND  Final   Special Requests   Final    BOTTLES DRAWN  AEROBIC ONLY Blood Culture adequate volume   Culture   Final    NO GROWTH 5 DAYS Performed at [facility], [place] [address] [place], [place] [address]    Report Status 10/05/2019 FINAL  Final  MRSA PCR Screening     Status: None   Collection Time: 09/30/19  8:50 AM   Specimen: Nasopharyngeal  Result Value Ref Range Status   MRSA by PCR NEGATIVE NEGATIVE Final    Comment:        The GeneXpert MRSA Assay (FDA approved for NASAL specimens only), is one component of a comprehensive MRSA colonization surveillance program. It is not intended to diagnose MRSA infection nor to guide or monitor treatment for MRSA infections. Performed at [facility], [place] [address], [place], [place] [address]     Radiology Studies: No results found.  Avelino Herren T. [place]  If 7PM-7AM, please contact night-coverage [email or web address] Password [email or web address] 10/05/2019, 1:31 PM

## 2019-10-06 LAB — POTASSIUM: Potassium: 3.6 mmol/L (ref 3.5–5.1)

## 2019-10-06 LAB — MAGNESIUM: Magnesium: 2.1 mg/dL (ref 1.7–2.4)

## 2019-10-06 NOTE — Progress Notes (Signed)
Pharmacy Antibiotic Note  Kadeen Sroka is a 54 y.o. male admitted on 09/29/2019 with sepsis; pt has neurogenic bladder, with Foley catheter.  Pharmacy has been consulted for vancomycin dosing and cefepime dosing.  Patient's sepsis likely due to a urinary tract source. Urine culture revealed >100,000 colonies/ml each of Pseudomonas aeruginosa and Enterococcus faecalis. Vancomycin utilized for enterococcus coverage due to penicillin allergy.    Today patient is afebrile, last WBC WNL, last trough three days ago was therapeutic, and last serum creatinine obtained three days ago was stable.  Current regimen is cefepime 2 g IV q8h x 10 days to end 6/22 and vancomycin 1500 mg IV q12h x 10 days to end 6/25.  Plan: Continue cefepime and vancomycin as currently ordered Obtain BMET tomorrow am Monitor WBC, temp, clinical improvement, any new cultures, renal function, vancomycin levels if clinically indicated.  Temp (24hrs), Avg:98 F (36.7 C), Min:97.9 F (36.6 C), Max:98.2 F (36.8 C)  Recent Labs  Lab 09/29/19 1815 09/30/19 0154 09/30/19 0521 09/30/19 0812 10/01/19 0510 10/03/19 0551 10/03/19 1732  WBC 10.9*  --  11.2*  --  8.1  --   --   CREATININE 1.16  --  0.86  --  0.89 0.80  --   LATICACIDVEN  --  1.7 2.0* 1.4  --   --   --   VANCOTROUGH  --   --   --   --   --   --  18    Estimated Creatinine Clearance: 101.4 mL/min (by C-G formula based on SCr of 0.8 mg/dL).    Allergies  Allergen Reactions  . Penicillins Anaphylaxis    Antimicrobials this admission: Ceftriaxone X 1 6/13 Cefepime 6/13 >>  Vancomycin 6/15 >>   Microbiology results: 6/13 Bld cx: NGTD 6/14 Urine cx: >100,000 colonies/ml each of Pseudomonas aerguinosa (suscept to cefepime), Enterococcus faecalis (suscept to vanc) 6/14 Bld cx X 2:  NGTD 6/14 MRSA PCR: negative  Thank you for allowing pharmacy to be a part of this patient's care.  Shela Commons, PharmD, BCPS Clinical pharmacist 10/06/2019, 7:51  AM

## 2019-10-06 NOTE — Progress Notes (Signed)
Patient ID: Lee House, male   DOB: 06-09-1965, 54 y.o.   MRN: [id number]  PROGRESS NOTE    Lee House  SEG:[id number] DOB: 1965/08/13 DOA: 09/29/2019 PCP: Patient, [name] Per    Brief Narrative:  54 year old male with history of schizophrenia/paranoia, bipolar disorder, COPD/asthma and neurogenic bladder with chronic Foley.  Patient presented to [facility] on 6/12 after pulling his Foley catheter but refused to have Foley replaced and discharged back to SNF just to be sent back to ED on 6/13 for Foley placement.  Patient was admitted on ceftriaxone for catheter associated UTI but became febrile and persistently hypotensive despite IV fluid boluses with mental status change.  He was transferred to ICU for vasopressor support.  Antibiotics escalated to cefepime.    While in ICU, urine culture grew Pseudomonas aeruginosa and Enterococcus faecalis.  Patient allergic to penicillin.  Vancomycin added.  Blood cultures were negative.  Patient came off vasopressor remained stable hemodynamically, and transferred back to [facility] on 6/16.  Patient has been stable from medical psych standpoint.   Assessment & Plan:   Principal Problem:   Catheter-associated urinary tract infection (HCC) Active Problems:   Schizophrenia ([place])   Neurogenic bladder   Sepsis ([place])  Severe sepsis with septic shock due to complicated UTI in patient with history of chronic Foley -Urine culture with Pseudomonas aeruginosa and Enterococcus faecalis. -Continue IV cefepime through 6/22 and IV vancomycin through 6/25.  Anaphylaxis to penicillin.  Acute metabolic encephalopathy-likely due to the above.  Seems to have resolved. -Treat sepsis as above  Neurogenic bladder with chronic indwelling Foley-patient pulled out Foley prior to admission and came back to ED by SNF for replacement -Foley reinserted in ED. Will continue. -Need outpatient follow-up with urology.  Schizophrenia/bipolar disorder -on 3  antipsychotics. Family reports his antipsychotics were reduced when he was hospitalized last month.  Family concerned about worsening delusion after he confused indwelling Foley for snake and pulled it out that led to multiple ED visits and current admission.  Psych consulted and recommended continuing current regimen.  -Appreciate psych input-continue current regimen -Optimize Mg and K  Chronic COPD/asthma: No PFT on file. -Continue breathing treatments  Iron deficiency anemia: H&H stable.  Iron sat 14 with TIBC of 343 and ferritin of 86. -P.o. ferrous sulfate with bowel regimen  Microbiology summarized: COVID-19 PCR negative. Urine cultures with Pseudomonas aeruginosa and Enterococcus faecalis Blood cultures NGTD MRSA PCR negative  DVT prophylaxis: Heparin SQ Code Status: Full code  Family Communication: Patient at bedside Disposition Plan: SNF   Consultants:   PCCM  Psychiatry  Procedures:  none  Antimicrobials: Anti-infectives (From admission, onward)   Start     Dose/Rate Route Frequency Ordered Stop   10/01/19 1400  ceFEPIme (MAXIPIME) 2 g in sodium chloride 0.9 % 100 mL IVPB     Discontinue     2 g 200 mL/hr over 30 Minutes Intravenous Every 8 hours 10/01/19 1244 10/08/19 2359   10/01/19 1300  vancomycin (VANCOREADY) IVPB 1500 mg/300 mL     Discontinue     1,500 mg 150 mL/hr over 120 Minutes Intravenous Every 12 hours 10/01/19 1246 10/11/19 1759   09/30/19 1000  ceFEPIme (MAXIPIME) 2 g in sodium chloride 0.9 % 100 mL IVPB  Status:  Discontinued        2 g 200 mL/hr over 30 Minutes Intravenous Every 12 hours 09/29/19 2120 10/01/19 1244   09/29/19 2200  ceFEPIme (MAXIPIME) 2 g in sodium chloride 0.9 % 100 mL  IVPB        2 g 200 mL/hr over 30 Minutes Intravenous  Once 09/29/19 2120 09/29/19 2305   09/29/19 2130  ceFEPIme (MAXIPIME) 2 g in sodium chloride 0.9 % 100 mL IVPB  Status:  Discontinued        2 g 200 mL/hr over 30 Minutes Intravenous  Once 09/29/19 2116  09/29/19 2120   09/29/19 2000  cefTRIAXone (ROCEPHIN) 2 g in sodium chloride 0.9 % 100 mL IVPB        2 g 200 mL/hr over 30 Minutes Intravenous  Once 09/29/19 1956 09/29/19 2224       Subjective: Feels ok, no new issues overnight  Objective: Vitals:   10/05/19 2100 10/06/19 0437 10/06/19 0838 10/06/19 1101  BP: (!) 102/59 110/76  (!) 94/58  Pulse: 83 69  70  Resp: 18 18  18   Temp: 98.2 F (36.8 C) 98 F (36.7 C)  97.7 F (36.5 C)  TempSrc: Oral   Oral  SpO2: 98% 99% 98% 100%  Weight: 67.9 kg       Intake/Output Summary (Last 24 hours) at 10/06/2019 1651 Last data filed at 10/06/2019 1600 Gross per 24 hour  Intake 3575.47 ml  Output 6675 ml  Net -3099.53 ml   Filed Weights   10/04/19 0438 10/04/19 2144 10/05/19 2100  Weight: 72 kg 69.9 kg 67.9 kg    Examination:  General exam: Appears calm and comfortable  Respiratory system: Clear to auscultation. Respiratory effort normal. Cardiovascular system: S1 & S2 heard, RRR.  Gastrointestinal system: Abdomen is nondistended, soft and nontender. In-dwelling foley Central nervous system: Alert and oriented. No focal neurological deficits. Extremities: Symmetric  Skin: No rashes Psychiatry: Calm    Data Reviewed: I have personally reviewed following labs and imaging studies  CBC: Recent Labs  Lab 09/29/19 1815 09/30/19 0521 10/01/19 0510 10/03/19 0551  WBC 10.9* 11.2* 8.1  --   HGB 12.4* 10.3* 9.6* 10.2*  HCT 40.0 32.6* 30.2* 33.3*  MCV 89.7 88.6 88.3  --   PLT 278 189 173  --    Basic Metabolic Panel: Recent Labs  Lab 09/29/19 1815 09/29/19 1815 09/30/19 0521 09/30/19 0521 09/30/19 0812 10/01/19 0510 10/02/19 0641 10/03/19 0551 10/05/19 0830 10/06/19 0902  NA 135  --  134*  --   --  135  --  141  --   --   K 4.1   < > 3.2*   < >  --  3.9 4.3 4.1 4.2 3.6  CL 99  --  103  --   --  106  --  106  --   --   CO2 26  --  22  --   --  22  --  25  --   --   GLUCOSE 103*  --  130*  --   --  117*  --  95  --    --   BUN 22*  --  19  --   --  12  --  11  --   --   CREATININE 1.16  --  0.86  --   --  0.89  --  0.80  --   --   CALCIUM 9.0  --  8.1*  --   --  7.6*  --  8.4*  --   --   MG  --   --   --   --  1.6* 2.0  --  2.0 2.1 2.1  PHOS  --   --   --   --   --   --   --  4.2  --   --    < > = values in this interval not displayed.   GFR: Estimated Creatinine Clearance: 101.4 mL/min (by C-G formula based on SCr of 0.8 mg/dL). Liver Function Tests: Recent Labs  Lab 09/29/19 1815 10/01/19 0510 10/02/19 0641 10/03/19 0551  AST 22 28 24   --   ALT 22 29 30   --   ALKPHOS 114 94 96  --   BILITOT 0.7 0.2* 0.8  --   PROT 6.7 5.1* 5.2*  --   ALBUMIN 3.3* 2.2* 2.3* 2.4*   Coagulation Profile: Recent Labs  Lab 09/30/19 0521  INR 1.3*   CBG: Recent Labs  Lab 09/30/19 0849  GLUCAP 106*   Anemia Panel: Recent Labs    10/04/19 0430  VITAMINB12 623  FOLATE 18.9  FERRITIN 96  TIBC 343  IRON 48  RETICCTPCT <0.4*   Sepsis Labs: Recent Labs  Lab 09/30/19 0154 09/30/19 0521 09/30/19 0812  PROCALCITON  --  27.02  --   LATICACIDVEN 1.7 2.0* 1.4    Recent Results (from the past 240 hour(s))  Culture, blood (single) w Reflex to ID Panel     Status: None   Collection Time: 09/29/19  6:06 PM   Specimen: BLOOD  Result Value Ref Range Status   Specimen Description BLOOD LEFT ANTECUBITAL  Final   Special Requests   Final    BOTTLES DRAWN AEROBIC AND ANAEROBIC Blood Culture adequate volume   Culture   Final    NO GROWTH 5 DAYS Performed at [facility], [place] [address] [place], [place] [address]    Report Status 10/04/2019 FINAL  Final  SARS Coronavirus 2 by RT PCR (hospital order, performed in [facility] hospital lab) Nasopharyngeal Nasopharyngeal Swab     Status: None   Collection Time: 09/29/19  8:52 PM   Specimen: Nasopharyngeal Swab  Result Value Ref Range Status   SARS Coronavirus 2 NEGATIVE NEGATIVE Final    Comment: (NOTE) SARS-CoV-2 target nucleic acids are NOT  DETECTED.  The SARS-CoV-2 RNA is generally detectable in upper and lower respiratory specimens during the acute phase of infection. The lowest concentration of SARS-CoV-2 viral copies this assay can detect is 250 copies / mL. A negative result does not preclude SARS-CoV-2 infection and should not be used as the sole basis for treatment or other patient management decisions.  A negative result may occur with improper specimen collection / handling, submission of specimen other than nasopharyngeal swab, presence of viral mutation(s) within the areas targeted by this assay, and inadequate number of viral copies (<250 copies / mL). A negative result must be combined with clinical observations, patient history, and epidemiological information.  Fact Sheet for Patients:   StrictlyIdeas.no  Fact Sheet for Healthcare Providers: BankingDealers.co.za  This test is not yet approved or  cleared by the [place] FDA and has been authorized for detection and/or diagnosis of SARS-CoV-2 by FDA under an Emergency Use Authorization (EUA).  This EUA will remain in effect (meaning this test can be used) for the duration of the COVID-19 declaration under Section 564(b)(1) of the Act, 21 U.S.C. section 360bbb-3(b)(1), unless the authorization is terminated or revoked sooner.  Performed at [facility], [place] [address], [place], [place] [address]   Culture, Urine     Status: Abnormal   Collection Time: 09/30/19  3:43 AM   Specimen: Urine, Catheterized  Result Value Ref Range Status   Specimen Description URINE, CATHETERIZED  Final  Special Requests   Final    NONE Performed at [facility], [place] [address], [place], [place] [address]    Culture (A)  Final    >=100,000 COLONIES/mL PSEUDOMONAS AERUGINOSA >=100,000 COLONIES/mL ENTEROCOCCUS FAECALIS    Report Status 10/02/2019 FINAL  Final   Organism ID, Bacteria PSEUDOMONAS AERUGINOSA  (A)  Final   Organism ID, Bacteria ENTEROCOCCUS FAECALIS (A)  Final      Susceptibility   Enterococcus faecalis - MIC*    AMPICILLIN <=2 SENSITIVE Sensitive     NITROFURANTOIN <=16 SENSITIVE Sensitive     VANCOMYCIN 1 SENSITIVE Sensitive     * >=100,000 COLONIES/mL ENTEROCOCCUS FAECALIS   Pseudomonas aeruginosa - MIC*    CEFTAZIDIME <=1 SENSITIVE Sensitive     CIPROFLOXACIN <=0.25 SENSITIVE Sensitive     GENTAMICIN <=1 SENSITIVE Sensitive     IMIPENEM 2 SENSITIVE Sensitive     PIP/TAZO <=4 SENSITIVE Sensitive     CEFEPIME <=1 SENSITIVE Sensitive     * >=100,000 COLONIES/mL PSEUDOMONAS AERUGINOSA  Culture, blood (routine x 2)     Status: None   Collection Time: 09/30/19  8:12 AM   Specimen: BLOOD RIGHT ARM  Result Value Ref Range Status   Specimen Description BLOOD RIGHT ARM  Final   Special Requests   Final    BOTTLES DRAWN AEROBIC ONLY Blood Culture adequate volume   Culture   Final    NO GROWTH 5 DAYS Performed at [facility], [address] [place], [place] [address]    Report Status 10/05/2019 FINAL  Final  Culture, blood (routine x 2)     Status: None   Collection Time: 09/30/19  8:13 AM   Specimen: BLOOD RIGHT HAND  Result Value Ref Range Status   Specimen Description BLOOD RIGHT HAND  Final   Special Requests   Final    BOTTLES DRAWN AEROBIC ONLY Blood Culture adequate volume   Culture   Final    NO GROWTH 5 DAYS Performed at [facility], [place] [address], [place], [place] [address]    Report Status 10/05/2019 FINAL  Final  MRSA PCR Screening     Status: None   Collection Time: 09/30/19  8:50 AM   Specimen: Nasopharyngeal  Result Value Ref Range Status   MRSA by PCR NEGATIVE NEGATIVE Final    Comment:        The GeneXpert MRSA Assay (FDA approved for NASAL specimens only), is one component of a comprehensive MRSA colonization surveillance program. It is not intended to diagnose MRSA infection nor to guide or monitor treatment for MRSA  infections. Performed at [facility], [place] [address], [place],  [address]       Radiology Studies: No results found.   Scheduled Meds: . ARIPiprazole  10 mg Oral BID  . aspirin EC  81 mg Oral BID  . benztropine  0.5 mg Oral BID  . calcium-vitamin D  1 tablet Oral Q breakfast  . Chlorhexidine Gluconate Cloth  6 each Topical Daily  . divalproex  125 mg Oral TID  . ferrous sulfate  325 mg Oral BID WC  . heparin injection (subcutaneous)  5,000 Units Subcutaneous Q8H  . meloxicam  7.5 mg Oral Daily  . mometasone-formoterol  2 puff Inhalation BID  . multivitamin with minerals  1 tablet Oral Daily  . OLANZapine  10 mg Oral BID  . pantoprazole  40 mg Oral Daily  . risperiDONE  4 mg Oral BID  Continuous Infusions: . sodium chloride 10 mL/hr at 10/01/19 1800  . sodium chloride    . ceFEPime (MAXIPIME) IV 2 g (10/06/19 1022)  . lactated ringers 75 mL/hr at 10/01/19 1800  . vancomycin 1,500 mg (10/06/19 0819)     LOS: 6 days    Donnamae Jude, MD 10/06/2019 4:51 PM [phone] [facility] If 7PM-7AM, please contact night-coverage 10/06/2019, 4:51 PM

## 2019-10-06 NOTE — Plan of Care (Signed)
?  Problem: Health Behavior/Discharge Planning: ?Goal: Ability to manage health-related needs will improve ?Outcome: Progressing ?  ?Problem: Coping: ?Goal: Level of anxiety will decrease ?Outcome: Progressing ?  ?Problem: Safety: ?Goal: Ability to remain free from injury will improve ?Outcome: Progressing ?  ?

## 2019-10-07 DIAGNOSIS — F203 Undifferentiated schizophrenia: Secondary | ICD-10-CM

## 2019-10-07 LAB — BASIC METABOLIC PANEL
Anion gap: 7 (ref 5–15)
BUN: 16 mg/dL (ref 6–20)
CO2: 26 mmol/L (ref 22–32)
Calcium: 8.3 mg/dL — ABNORMAL LOW (ref 8.9–10.3)
Chloride: 109 mmol/L (ref 98–111)
Creatinine, Ser: 0.94 mg/dL (ref 0.61–1.24)
GFR calc Af Amer: >60 mL/min (ref >60)
GFR calc non Af Amer: >60 mL/min (ref >60)
Glucose, Bld: 87 mg/dL (ref 70–99)
Potassium: 3.8 mmol/L (ref 3.5–5.1)
Sodium: 142 mmol/L (ref 135–145)

## 2019-10-07 NOTE — Clinical Social Work Note (Signed)
Per MD, patient should be ready for discharge in three to four days. CSW contacted Narda Rutherford, Engineer, site at [place] and discharge update provided. Per Narda Rutherford the POA has signed paperwork and wife is paying to hold his bed. Per [place], insurance authorization provided.  CSW will continue to follow and facilitate discharge to [place] once discharged and insurance authorization received.  Velton Roselle Givens, MSW, LCSW Licensed Clinical Social Worker [place] [phone]

## 2019-10-07 NOTE — Progress Notes (Addendum)
Patient ID: Lee House, male   DOB: 01-25-1966, 54 y.o.   MRN: [id number]  PROGRESS NOTE    Lee House  OZY:[id number] DOB: Sep 15, 1965 DOA: 09/29/2019 PCP: Patient, [name] Per    Brief Narrative:  54 year old male with history of schizophrenia/paranoia, bipolar disorder, COPD/asthma and neurogenic bladder with chronic Foley. Patient presented to [facility] on 6/12 after pulling his Foley catheter but refused to have Foley replaced and discharged back to SNF just to be sent back to ED on 6/13 for Foley placement.  Patient was admitted on ceftriaxone for catheter associated UTI but became febrile and persistently hypotensive despite IV fluid boluses with mental status change. He was transferred to ICU for vasopressor support. Antibiotics escalated to cefepime.   While in ICU, urine culture grew Pseudomonas aeruginosa and Enterococcus faecalis. Patient allergic to penicillin. Vancomycin added. Blood cultures were negative.  Patient came off vasopressor remained stable hemodynamically, and transferred back to [facility] on 6/16.Patient has been stable from medical psych standpoint.    Assessment & Plan:   Principal Problem:   Catheter-associated urinary tract infection (HCC) Active Problems:   Schizophrenia ([place])   Neurogenic bladder   Sepsis ([place])  Severe sepsis with septic shock due to complicated UTI in patient with history of chronic Foley -Urine culture with Pseudomonas aeruginosa and Enterococcus faecalis. -Continue IV cefepime through 6/22 and IV vancomycin through 6/25. Anaphylaxis to penicillin.  Acute metabolic encephalopathy-likely due to the above. Seems to have resolved. -Treat sepsis as above  Neurogenic bladder with chronic indwelling Foley-patient pulled out Foley prior to admission and came back to ED by SNF for replacement -Foley reinserted in ED. Will continue. -Need outpatient follow-up with urology.  Schizophrenia/bipolar disorder -on3  antipsychotics.Family reports his antipsychotics were reduced when he was hospitalized last month. Family concerned about worsening delusion after he confusedindwelling Foley for snake and pulled it outthat led to multiple ED visits and current admission. Psych consulted and recommended continuing current regimen.  -Appreciate psych input-continue current regimen -Optimize Mg and K  Chronic COPD/asthma: No PFT on file. -Continue breathing treatments  Iron deficiency anemia: H&H stable. Iron sat 14 with TIBC of 343 and ferritin of 86. -P.o. ferrous sulfate with bowel regimen  Microbiology summarized: COVID-19 PCR negative. Urine cultures with Pseudomonas aeruginosa and Enterococcus faecalis Blood cultures NGTD MRSA PCR negative  DVT prophylaxis: Heparin SQ Code Status: Full code  Family Communication: Richarda Blade POA--she would like an inpt. beh health treatment facility if possible. Disposition Plan: SNF  Consultants:   Psychiatry  Procedures:  None   Antimicrobials: Anti-infectives (From admission, onward)   Start     Dose/Rate Route Frequency Ordered Stop   10/01/19 1400  ceFEPIme (MAXIPIME) 2 g in sodium chloride 0.9 % 100 mL IVPB     Discontinue     2 g 200 mL/hr over 30 Minutes Intravenous Every 8 hours 10/01/19 1244 10/08/19 2359   10/01/19 1300  vancomycin (VANCOREADY) IVPB 1500 mg/300 mL     Discontinue     1,500 mg 150 mL/hr over 120 Minutes Intravenous Every 12 hours 10/01/19 1246 10/11/19 1759   09/30/19 1000  ceFEPIme (MAXIPIME) 2 g in sodium chloride 0.9 % 100 mL IVPB  Status:  Discontinued        2 g 200 mL/hr over 30 Minutes Intravenous Every 12 hours 09/29/19 2120 10/01/19 1244   09/29/19 2200  ceFEPIme (MAXIPIME) 2 g in sodium chloride 0.9 % 100 mL IVPB        2  g 200 mL/hr over 30 Minutes Intravenous  Once 09/29/19 2120 09/29/19 2305   09/29/19 2130  ceFEPIme (MAXIPIME) 2 g in sodium chloride 0.9 % 100 mL IVPB  Status:  Discontinued        2  g 200 mL/hr over 30 Minutes Intravenous  Once 09/29/19 2116 09/29/19 2120   09/29/19 2000  cefTRIAXone (ROCEPHIN) 2 g in sodium chloride 0.9 % 100 mL IVPB        2 g 200 mL/hr over 30 Minutes Intravenous  Once 09/29/19 1956 09/29/19 2224       Subjective: He is watching and helping people in the hallway  Objective: Vitals:   10/06/19 2232 10/07/19 0522 10/07/19 0833 10/07/19 0916  BP: 100/60 102/66  110/75  Pulse: 73 60  91  Resp: 16 18  20   Temp:  97.9 F (36.6 C)  99 F (37.2 C)  TempSrc:  Oral  Oral  SpO2: 97% 100% 99% 100%  Weight:        Intake/Output Summary (Last 24 hours) at 10/07/2019 1548 Last data filed at 10/07/2019 1436 Gross per 24 hour  Intake 2901.91 ml  Output 5000 ml  Net -2098.09 ml   Filed Weights   10/04/19 0438 10/04/19 2144 10/05/19 2100  Weight: 72 kg 69.9 kg 67.9 kg    Examination:  General exam: Appears calm and comfortable  Respiratory system: Clear to auscultation. Respiratory effort normal. Cardiovascular system: S1 & S2 heard, RRR.  Gastrointestinal system: Abdomen is nondistended, soft and nontender.  Central nervous system: Alert and oriented. No focal neurological deficits. Extremities: Symmetric  Skin: No rashes Psychiatry: Calm  Data Reviewed: I have personally reviewed following labs and imaging studies  CBC: Recent Labs  Lab 10/01/19 0510 10/03/19 0551  WBC 8.1  --   HGB 9.6* 10.2*  HCT 30.2* 33.3*  MCV 88.3  --   PLT 173  --    Basic Metabolic Panel: Recent Labs  Lab 10/01/19 0510 10/01/19 0510 10/02/19 0641 10/03/19 0551 10/05/19 0830 10/06/19 0902 10/07/19 0544  NA 135  --   --  141  --   --  142  K 3.9   < > 4.3 4.1 4.2 3.6 3.8  CL 106  --   --  106  --   --  109  CO2 22  --   --  25  --   --  26  GLUCOSE 117*  --   --  95  --   --  87  BUN 12  --   --  11  --   --  16  CREATININE 0.89  --   --  0.80  --   --  0.94  CALCIUM 7.6*  --   --  8.4*  --   --  8.3*  MG 2.0  --   --  2.0 2.1 2.1  --   PHOS   --   --   --  4.2  --   --   --    < > = values in this interval not displayed.   GFR: Estimated Creatinine Clearance: 86.3 mL/min (by C-G formula based on SCr of 0.94 mg/dL). Liver Function Tests: Recent Labs  Lab 10/01/19 0510 10/02/19 0641 10/03/19 0551  AST 28 24  --   ALT 29 30  --   ALKPHOS 94 96  --   BILITOT 0.2* 0.8  --   PROT 5.1* 5.2*  --   ALBUMIN 2.2* 2.3* 2.4*  Recent Results (from the past 240 hour(s))  Culture, blood (single) w Reflex to ID Panel     Status: None   Collection Time: 09/29/19  6:06 PM   Specimen: BLOOD  Result Value Ref Range Status   Specimen Description BLOOD LEFT ANTECUBITAL  Final   Special Requests   Final    BOTTLES DRAWN AEROBIC AND ANAEROBIC Blood Culture adequate volume   Culture   Final    NO GROWTH 5 DAYS Performed at [facility], [address] [place], [place] [address]    Report Status 10/04/2019 FINAL  Final  SARS Coronavirus 2 by RT PCR (hospital order, performed in [facility] hospital lab) Nasopharyngeal Nasopharyngeal Swab     Status: None   Collection Time: 09/29/19  8:52 PM   Specimen: Nasopharyngeal Swab  Result Value Ref Range Status   SARS Coronavirus 2 NEGATIVE NEGATIVE Final    Comment: (NOTE) SARS-CoV-2 target nucleic acids are NOT DETECTED.  The SARS-CoV-2 RNA is generally detectable in upper and lower respiratory specimens during the acute phase of infection. The lowest concentration of SARS-CoV-2 viral copies this assay can detect is 250 copies / mL. A negative result does not preclude SARS-CoV-2 infection and should not be used as the sole basis for treatment or other patient management decisions.  A negative result may occur with improper specimen collection / handling, submission of specimen other than nasopharyngeal swab, presence of viral mutation(s) within the areas targeted by this assay, and inadequate number of viral copies (<250 copies / mL). A negative result must be combined with  clinical observations, patient history, and epidemiological information.  Fact Sheet for Patients:   StrictlyIdeas.no  Fact Sheet for Healthcare Providers: BankingDealers.co.za  This test is not yet approved or  cleared by the [place] FDA and has been authorized for detection and/or diagnosis of SARS-CoV-2 by FDA under an Emergency Use Authorization (EUA).  This EUA will remain in effect (meaning this test can be used) for the duration of the COVID-19 declaration under Section 564(b)(1) of the Act, 21 U.S.C. section 360bbb-3(b)(1), unless the authorization is terminated or revoked sooner.  Performed at [facility], [place] [address], [place], [place] [address]   Culture, Urine     Status: Abnormal   Collection Time: 09/30/19  3:43 AM   Specimen: Urine, Catheterized  Result Value Ref Range Status   Specimen Description URINE, CATHETERIZED  Final   Special Requests   Final    NONE Performed at [facility], [place] [address] [place], [place] [address]    Culture (A)  Final    >=100,000 COLONIES/mL PSEUDOMONAS AERUGINOSA >=100,000 COLONIES/mL ENTEROCOCCUS FAECALIS    Report Status 10/02/2019 FINAL  Final   Organism ID, Bacteria PSEUDOMONAS AERUGINOSA (A)  Final   Organism ID, Bacteria ENTEROCOCCUS FAECALIS (A)  Final      Susceptibility   Enterococcus faecalis - MIC*    AMPICILLIN <=2 SENSITIVE Sensitive     NITROFURANTOIN <=16 SENSITIVE Sensitive     VANCOMYCIN 1 SENSITIVE Sensitive     * >=100,000 COLONIES/mL ENTEROCOCCUS FAECALIS   Pseudomonas aeruginosa - MIC*    CEFTAZIDIME <=1 SENSITIVE Sensitive     CIPROFLOXACIN <=0.25 SENSITIVE Sensitive     GENTAMICIN <=1 SENSITIVE Sensitive     IMIPENEM 2 SENSITIVE Sensitive     PIP/TAZO <=4 SENSITIVE Sensitive     CEFEPIME <=1 SENSITIVE Sensitive     * >=100,000 COLONIES/mL PSEUDOMONAS AERUGINOSA  Culture, blood (routine x 2)  Status: None   Collection Time:  09/30/19  8:12 AM   Specimen: BLOOD RIGHT ARM  Result Value Ref Range Status   Specimen Description BLOOD RIGHT ARM  Final   Special Requests   Final    BOTTLES DRAWN AEROBIC ONLY Blood Culture adequate volume   Culture   Final    NO GROWTH 5 DAYS Performed at [facility], [address] [place], [place] [address]    Report Status 10/05/2019 FINAL  Final  Culture, blood (routine x 2)     Status: None   Collection Time: 09/30/19  8:13 AM   Specimen: BLOOD RIGHT HAND  Result Value Ref Range Status   Specimen Description BLOOD RIGHT HAND  Final   Special Requests   Final    BOTTLES DRAWN AEROBIC ONLY Blood Culture adequate volume   Culture   Final    NO GROWTH 5 DAYS Performed at [facility], [place] [address], [place], [place] [address]    Report Status 10/05/2019 FINAL  Final  MRSA PCR Screening     Status: None   Collection Time: 09/30/19  8:50 AM   Specimen: Nasopharyngeal  Result Value Ref Range Status   MRSA by PCR NEGATIVE NEGATIVE Final    Comment:        The GeneXpert MRSA Assay (FDA approved for NASAL specimens only), is one component of a comprehensive MRSA colonization surveillance program. It is not intended to diagnose MRSA infection nor to guide or monitor treatment for MRSA infections. Performed at [facility], [place] [address], [place], [place] [address]       Radiology Studies: No results found.   Scheduled Meds: . ARIPiprazole  10 mg Oral BID  . aspirin EC  81 mg Oral BID  . benztropine  0.5 mg Oral BID  . calcium-vitamin D  1 tablet Oral Q breakfast  . Chlorhexidine Gluconate Cloth  6 each Topical Daily  . divalproex  125 mg Oral TID  . ferrous sulfate  325 mg Oral BID WC  . heparin injection (subcutaneous)  5,000 Units Subcutaneous Q8H  . meloxicam  7.5 mg Oral Daily  . mometasone-formoterol  2 puff Inhalation BID  . multivitamin with minerals  1 tablet Oral Daily  . OLANZapine  10 mg Oral BID  . pantoprazole  40 mg Oral  Daily  . risperiDONE  4 mg Oral BID   Continuous Infusions: . sodium chloride 10 mL/hr at 10/01/19 1800  . sodium chloride    . ceFEPime (MAXIPIME) IV 2 g (10/07/19 1343)  . lactated ringers 75 mL/hr at 10/07/19 1342  . vancomycin 150 mL/hr at 10/07/19 0700     LOS: 7 days    Donnamae Jude, MD 10/07/2019 3:48 PM [phone] [facility] If 7PM-7AM, please contact night-coverage 10/07/2019, 3:48 PM

## 2019-10-07 NOTE — Progress Notes (Signed)
Physical Therapy Treatment Patient Details Name: Lee House MRN: [id number] DOB: 24-Apr-1965 Today's Date: 10/07/2019    History of Present Illness Pt is a 54 yo male presenting with septic shock due to complicated UTI in the presence of chronic Foley use. PMH includes: schizophrenia/paranoia, bipolar disorder, COPD/asthma and neurogenic bladder with chronic Foley.    PT Comments    Continuing work on functional mobility and activity tolerance;  Lots of encouragement to participate; Still showing some impulsivity; Able to incr amb distance; Overall progressing well; Anticipate continuing good progress at post-acute rehabilitation.   Follow Up Recommendations  SNF;Supervision/Assistance - 24 hour (return to SNF for continued rehab)     Equipment Recommendations   (defer to post acute)    Recommendations for Other Services       Precautions / Restrictions Precautions Precautions: Fall Precaution Comments: recent admission for R hip fx Restrictions Other Position/Activity Restrictions: WBAT per Care Everywhere notes    Mobility  Bed Mobility                  Transfers Overall transfer level: Needs assistance Equipment used: Rolling walker (2 wheeled);None Transfers: Sit to/from Stand Sit to Stand: Min guard         General transfer comment: min guard for safety while performing sit>standing using RW for support and v/c's for hand placement  Ambulation/Gait Ambulation/Gait assistance: Min guard Gait Distance (Feet): 100 Feet Assistive device: Rolling walker (2 wheeled) Gait Pattern/deviations: Step-through pattern;Decreased stride length;Antalgic     General Gait Details: Min guard for safety, increased antalgic gait pattern towards end. Cues for upper trunk control, upward gaze   Stairs             Wheelchair Mobility    Modified Rankin (Stroke Patients Only)       Balance Overall balance assessment: Needs  assistance Sitting-balance support: Feet supported Sitting balance-Leahy Scale: Good       Standing balance-Leahy Scale: Fair                              Cognition Arousal/Alertness: Awake/alert Behavior During Therapy: Agitated;Impulsive Overall Cognitive Status: Impaired/Different from baseline Area of Impairment: Attention;Memory;Following commands;Safety/judgement                 Orientation Level: Disoriented to;Time             General Comments: pt impulsive, pulling at lines and attempting to stand and move without assistance       Exercises      General Comments        Pertinent Vitals/Pain Pain Assessment: Faces Faces Pain Scale: Hurts a little bit Pain Location: R hip during ambulation Pain Descriptors / Indicators: Sore Pain Intervention(s): Monitored during session    Home Living                      Prior Function            PT Goals (current goals can now be found in the care plan section) Acute Rehab PT Goals Patient Stated Goal: less pain PT Goal Formulation: With patient Time For Goal Achievement: 10/17/19 Potential to Achieve Goals: Good Progress towards PT goals: Progressing toward goals    Frequency    Min 2X/week      PT Plan Current plan remains appropriate    Co-evaluation              AM-PAC  PT "6 Clicks" Mobility   Outcome Measure  Help needed turning from your back to your side while in a flat bed without using bedrails?: None Help needed moving from lying on your back to sitting on the side of a flat bed without using bedrails?: None Help needed moving to and from a bed to a chair (including a wheelchair)?: A Little Help needed standing up from a chair using your arms (e.g., wheelchair or bedside chair)?: A Little Help needed to walk in hospital room?: A Little Help needed climbing 3-5 steps with a railing? : A Little 6 Click Score: 20    End of Session Equipment Utilized During  Treatment: Gait belt Activity Tolerance: Patient tolerated treatment well Patient left: in chair;with call bell/phone within reach;with chair alarm set Nurse Communication: Mobility status PT Visit Diagnosis: Unsteadiness on feet (R26.81);Other abnormalities of gait and mobility (R26.89);Difficulty in walking, not elsewhere classified (R26.2)     Time: 1640-1700 PT Time Calculation (min) (ACUTE ONLY): 20 min  Charges:  $Gait Training: 8-22 mins                     Roney [place], [place]  Acute Rehabilitation Services Pager [phone] Office [phone]    [place]ta [place] 10/07/2019, 7:19 PM

## 2019-10-08 NOTE — Plan of Care (Signed)
  Problem: Coping: Goal: Level of anxiety will decrease Outcome: Adequate for Discharge   

## 2019-10-08 NOTE — Progress Notes (Signed)
Patient ID: Lee House, male   DOB: 1965-05-18, 54 y.o.   MRN: [id number]  PROGRESS NOTE    Lee House  YDX:[id number] DOB: October 25, 1965 DOA: 09/29/2019 PCP: Patient, [name] Per    Brief Narrative:  54 year old male with history of schizophrenia/paranoia, bipolar disorder, COPD/asthma and neurogenic bladder with chronic Foley. Patient presented to [facility] on 6/12 after pulling his Foley catheter but refused to have Foley replaced and discharged back to SNF just to be sent back to ED on 6/13 for Foley placement.  Patient was admitted on ceftriaxone for catheter associated UTI but became febrile and persistently hypotensive despite IV fluid boluses with mental status change. He was transferred to ICU for vasopressor support. Antibiotics escalated to cefepime.   While in ICU, urine culture grew Pseudomonas aeruginosa and Enterococcus faecalis. Patient allergic to penicillin. Vancomycin added. Blood cultures were negative.  Patient came off vasopressor remained stable hemodynamically, and transferred back to [facility] on 6/16.Patient has been stable from medical psych standpoint.   Assessment & Plan:   Principal Problem:   Catheter-associated urinary tract infection (HCC) Active Problems:   Schizophrenia ([place])   Neurogenic bladder   Sepsis ([place])  Severe sepsis with septic shock due to complicated UTI in patient with history of chronic Foley -Urine culture with Pseudomonas aeruginosa and Enterococcus faecalis. -Continue IV cefepime through 6/22 and IV vancomycin through 6/25. Anaphylaxis to penicillin.  Acute metabolic encephalopathy-likely due to the above.Seems to have resolved. -Treat sepsis as above  Neurogenic bladder with chronic indwelling Foley-patient pulled out Foley prior to admission and came back to ED by SNF for replacement -Foley reinserted in ED. Will continue. -Need outpatient follow-up with urology.  Schizophrenia/bipolar disorder -on3  antipsychotics.Family reports his antipsychotics were reduced when he was hospitalized last month. Family concerned about worsening delusion after he confusedindwelling Foley for snake and pulled it outthat led to multiple ED visits and current admission. Psych consulted and recommended continuing current regimen.  -Appreciate psych input-continue current regimen -Optimize Mg and K  Chronic COPD/asthma:No PFT on file. -Continue breathing treatments  Iron deficiency anemia:H&H stable. Iron sat 14 with TIBC of 343 and ferritin of 86. -P.o. ferrous sulfate with bowel regimen   DVT prophylaxis: Heparin SQ Code Status: Full code  Family Communication: Patient at bedside attempted to call Dr. Darleene Cleaver who has HIPPA release message left [phone]  Disposition Plan: SNF   Consultants:   Psychiatry  Procedures:  None  Antimicrobials: Anti-infectives (From admission, onward)   Start     Dose/Rate Route Frequency Ordered Stop   10/01/19 1400  ceFEPIme (MAXIPIME) 2 g in sodium chloride 0.9 % 100 mL IVPB     Discontinue     2 g 200 mL/hr over 30 Minutes Intravenous Every 8 hours 10/01/19 1244 10/08/19 2359   10/01/19 1300  vancomycin (VANCOREADY) IVPB 1500 mg/300 mL     Discontinue     1,500 mg 150 mL/hr over 120 Minutes Intravenous Every 12 hours 10/01/19 1246 10/11/19 1759   09/30/19 1000  ceFEPIme (MAXIPIME) 2 g in sodium chloride 0.9 % 100 mL IVPB  Status:  Discontinued        2 g 200 mL/hr over 30 Minutes Intravenous Every 12 hours 09/29/19 2120 10/01/19 1244   09/29/19 2200  ceFEPIme (MAXIPIME) 2 g in sodium chloride 0.9 % 100 mL IVPB        2 g 200 mL/hr over 30 Minutes Intravenous  Once 09/29/19 2120 09/29/19 2305   09/29/19 2130  ceFEPIme (MAXIPIME)  2 g in sodium chloride 0.9 % 100 mL IVPB  Status:  Discontinued        2 g 200 mL/hr over 30 Minutes Intravenous  Once 09/29/19 2116 09/29/19 2120   09/29/19 2000  cefTRIAXone (ROCEPHIN) 2 g in sodium chloride 0.9 %  100 mL IVPB        2 g 200 mL/hr over 30 Minutes Intravenous  Once 09/29/19 1956 09/29/19 2224       Subjective: No complaints today  Objective: Vitals:   10/07/19 2142 10/08/19 0457 10/08/19 0745 10/08/19 0940  BP: 101/67 101/65  92/60  Pulse: 76 65  83  Resp: 14 14  18   Temp: (!) 97.4 F (36.3 C) 98 F (36.7 C)  97.6 F (36.4 C)  TempSrc: Oral Oral  Oral  SpO2: 97% 100% 98% 100%  Weight: 68.5 kg       Intake/Output Summary (Last 24 hours) at 10/08/2019 1253 Last data filed at 10/08/2019 1025 Gross per 24 hour  Intake 1940.11 ml  Output 7350 ml  Net -5409.89 ml   Filed Weights   10/04/19 2144 10/05/19 2100 10/07/19 2142  Weight: 69.9 kg 67.9 kg 68.5 kg    Examination:  General exam: Appears calm and comfortable  Respiratory system: Clear to auscultation. Respiratory effort normal. Cardiovascular system: S1 & S2 heard, RRR.  Gastrointestinal system: Abdomen is nondistended, soft and nontender.  Central nervous system: Alert and oriented. No focal neurological deficits. Extremities: Symmetric  Skin: No rashes Psychiatry: calm   Data Reviewed: I have personally reviewed following labs and imaging studies  CBC: Recent Labs  Lab 10/03/19 0551  HGB 10.2*  HCT 77.8*   Basic Metabolic Panel: Recent Labs  Lab 10/02/19 0641 10/03/19 0551 10/05/19 0830 10/06/19 0902 10/07/19 0544  NA  --  141  --   --  142  K 4.3 4.1 4.2 3.6 3.8  CL  --  106  --   --  109  CO2  --  25  --   --  26  GLUCOSE  --  95  --   --  87  BUN  --  11  --   --  16  CREATININE  --  0.80  --   --  0.94  CALCIUM  --  8.4*  --   --  8.3*  MG  --  2.0 2.1 2.1  --   PHOS  --  4.2  --   --   --    GFR: Estimated Creatinine Clearance: 87 mL/min (by C-G formula based on SCr of 0.94 mg/dL). Liver Function Tests: Recent Labs  Lab 10/02/19 0641 10/03/19 0551  AST 24  --   ALT 30  --   ALKPHOS 96  --   BILITOT 0.8  --   PROT 5.2*  --   ALBUMIN 2.3* 2.4*    Recent Results (from  the past 240 hour(s))  Culture, blood (single) w Reflex to ID Panel     Status: None   Collection Time: 09/29/19  6:06 PM   Specimen: BLOOD  Result Value Ref Range Status   Specimen Description BLOOD LEFT ANTECUBITAL  Final   Special Requests   Final    BOTTLES DRAWN AEROBIC AND ANAEROBIC Blood Culture adequate volume   Culture   Final    NO GROWTH 5 DAYS Performed at [facility], [place] [address], [place], [place] [address]    Report Status 10/04/2019 FINAL  Final  SARS Coronavirus  2 by RT PCR (hospital order, performed in [facility] hospital lab) Nasopharyngeal Nasopharyngeal Swab     Status: None   Collection Time: 09/29/19  8:52 PM   Specimen: Nasopharyngeal Swab  Result Value Ref Range Status   SARS Coronavirus 2 NEGATIVE NEGATIVE Final    Comment: (NOTE) SARS-CoV-2 target nucleic acids are NOT DETECTED.  The SARS-CoV-2 RNA is generally detectable in upper and lower respiratory specimens during the acute phase of infection. The lowest concentration of SARS-CoV-2 viral copies this assay can detect is 250 copies / mL. A negative result does not preclude SARS-CoV-2 infection and should not be used as the sole basis for treatment or other patient management decisions.  A negative result may occur with improper specimen collection / handling, submission of specimen other than nasopharyngeal swab, presence of viral mutation(s) within the areas targeted by this assay, and inadequate number of viral copies (<250 copies / mL). A negative result must be combined with clinical observations, patient history, and epidemiological information.  Fact Sheet for Patients:   StrictlyIdeas.no  Fact Sheet for Healthcare Providers: BankingDealers.co.za  This test is not yet approved or  cleared by the [place] FDA and has been authorized for detection and/or diagnosis of SARS-CoV-2 by FDA under an Emergency Use Authorization (EUA).   This EUA will remain in effect (meaning this test can be used) for the duration of the COVID-19 declaration under Section 564(b)(1) of the Act, 21 U.S.C. section 360bbb-3(b)(1), unless the authorization is terminated or revoked sooner.  Performed at [facility], [place] [address], [place], [place] [address]   Culture, Urine     Status: Abnormal   Collection Time: 09/30/19  3:43 AM   Specimen: Urine, Catheterized  Result Value Ref Range Status   Specimen Description URINE, CATHETERIZED  Final   Special Requests   Final    NONE Performed at [facility], [place] [address] [place], [place] [address]    Culture (A)  Final    >=100,000 COLONIES/mL PSEUDOMONAS AERUGINOSA >=100,000 COLONIES/mL ENTEROCOCCUS FAECALIS    Report Status 10/02/2019 FINAL  Final   Organism ID, Bacteria PSEUDOMONAS AERUGINOSA (A)  Final   Organism ID, Bacteria ENTEROCOCCUS FAECALIS (A)  Final      Susceptibility   Enterococcus faecalis - MIC*    AMPICILLIN <=2 SENSITIVE Sensitive     NITROFURANTOIN <=16 SENSITIVE Sensitive     VANCOMYCIN 1 SENSITIVE Sensitive     * >=100,000 COLONIES/mL ENTEROCOCCUS FAECALIS   Pseudomonas aeruginosa - MIC*    CEFTAZIDIME <=1 SENSITIVE Sensitive     CIPROFLOXACIN <=0.25 SENSITIVE Sensitive     GENTAMICIN <=1 SENSITIVE Sensitive     IMIPENEM 2 SENSITIVE Sensitive     PIP/TAZO <=4 SENSITIVE Sensitive     CEFEPIME <=1 SENSITIVE Sensitive     * >=100,000 COLONIES/mL PSEUDOMONAS AERUGINOSA  Culture, blood (routine x 2)     Status: None   Collection Time: 09/30/19  8:12 AM   Specimen: BLOOD RIGHT ARM  Result Value Ref Range Status   Specimen Description BLOOD RIGHT ARM  Final   Special Requests   Final    BOTTLES DRAWN AEROBIC ONLY Blood Culture adequate volume   Culture   Final    NO GROWTH 5 DAYS Performed at [facility], [address], [place], [place] [address]    Report Status 10/05/2019 FINAL  Final  Culture, blood (routine x 2)     Status: None    Collection Time: 09/30/19  8:13 AM   Specimen: BLOOD RIGHT HAND  Result Value Ref Range Status   Specimen Description BLOOD RIGHT HAND  Final   Special Requests   Final    BOTTLES DRAWN AEROBIC ONLY Blood Culture adequate volume   Culture   Final    NO GROWTH 5 DAYS Performed at [facility], [address], [place], [place] [address]    Report Status 10/05/2019 FINAL  Final  MRSA PCR Screening     Status: None   Collection Time: 09/30/19  8:50 AM   Specimen: Nasopharyngeal  Result Value Ref Range Status   MRSA by PCR NEGATIVE NEGATIVE Final    Comment:        The GeneXpert MRSA Assay (FDA approved for NASAL specimens only), is one component of a comprehensive MRSA colonization surveillance program. It is not intended to diagnose MRSA infection nor to guide or monitor treatment for MRSA infections. Performed at [facility], [place] [address], [place], [place] [address]       Radiology Studies: No results found.   Scheduled Meds:  ARIPiprazole  10 mg Oral BID   aspirin EC  81 mg Oral BID   benztropine  0.5 mg Oral BID   calcium-vitamin D  1 tablet Oral Q breakfast   Chlorhexidine Gluconate Cloth  6 each Topical Daily   divalproex  125 mg Oral TID   ferrous sulfate  325 mg Oral BID WC   heparin injection (subcutaneous)  5,000 Units Subcutaneous Q8H   meloxicam  7.5 mg Oral Daily   mometasone-formoterol  2 puff Inhalation BID   multivitamin with minerals  1 tablet Oral Daily   OLANZapine  10 mg Oral BID   pantoprazole  40 mg Oral Daily   risperiDONE  4 mg Oral BID   Continuous Infusions:  sodium chloride 10 mL/hr at 10/01/19 1800   sodium chloride     ceFEPime (MAXIPIME) IV 2 g (10/08/19 0606)   lactated ringers 75 mL/hr at 10/08/19 0110   vancomycin 1,500 mg (10/08/19 0639)     LOS: 8 days    Donnamae Jude, MD 10/08/2019 12:53 PM [phone] [facility] If 7PM-7AM, please contact night-coverage 10/08/2019, 12:53 PM

## 2019-10-09 DIAGNOSIS — T83511D Infection and inflammatory reaction due to indwelling urethral catheter, subsequent encounter: Secondary | ICD-10-CM

## 2019-10-09 LAB — GLUCOSE, CAPILLARY: Glucose-Capillary: 85 mg/dL (ref 70–99)

## 2019-10-09 MED ORDER — IBUPROFEN 400 MG PO TABS: 400.0000 mg | ORAL_TABLET | Freq: Four times a day (QID) | ORAL | Status: DC | PRN

## 2019-10-09 MED ORDER — SACCHAROMYCES BOULARDII 250 MG PO CAPS
250.0000 mg | ORAL_CAPSULE | Freq: Two times a day (BID) | ORAL | Status: DC
  Administered 2019-10-09 – 2019-10-14 (×11): 250 mg via ORAL
  Filled 2019-10-09 (×11): qty 1

## 2019-10-09 MED ORDER — RISPERIDONE 2 MG PO TABS
4.0000 mg | ORAL_TABLET | Freq: Every day | ORAL | Status: DC
  Administered 2019-10-09 – 2019-10-13 (×5): 4 mg via ORAL
  Filled 2019-10-09 (×6): qty 2

## 2019-10-09 MED ORDER — RISPERIDONE 2 MG PO TABS
2.0000 mg | ORAL_TABLET | Freq: Every day | ORAL | Status: DC
  Administered 2019-10-09 – 2019-10-14 (×6): 2 mg via ORAL
  Filled 2019-10-09 (×6): qty 1

## 2019-10-09 MED ORDER — RISPERIDONE 2 MG PO TABS: 2.0000 mg | ORAL_TABLET | Freq: Two times a day (BID) | ORAL | Status: DC

## 2019-10-09 NOTE — Plan of Care (Signed)
  Problem: Education: Goal: Knowledge of General Education information will improve Description Including pain rating scale, medication(s)/side effects and non-pharmacologic comfort measures Outcome: Progressing   

## 2019-10-09 NOTE — Progress Notes (Addendum)
Patient ID: Lee House, male   DOB: 06-Apr-1966, 54 y.o.   MRN: [id number]  PROGRESS NOTE    Lee House  ESP:[id number] DOB: December 03, 1965 DOA: 09/29/2019 PCP: Patient, [name] Per    Brief Narrative:  54 year old male with history of schizophrenia/paranoia, bipolar disorder, COPD/asthma and neurogenic bladder with chronic Foley. Patient presented to [facility] on 6/12 after pulling his Foley catheter but refused to have Foley replaced and discharged back to SNF just to be sent back to ED on 6/13 for Foley placement.  Patient was admitted with catheter associated UTI but became febrile and persistently hypotensive despite IV fluid boluses with mental status change. He was transferred to ICU for vasopressor support. Antibiotics changed to cefepime.   While in ICU, urine culture grew Pseudomonas aeruginosa and Enterococcus faecalis. Patient allergic to penicillin. Vancomycin added. Blood cultures were negative.  Weaned off vasopressors, blood pressure stabilized and was transferred to [facility] service,  Has also been seen by psych for medication management  Assessment & Plan:  Severe sepsis with septic shock due to complicated UTI in patient with history of chronic Foley -Urine culture with Pseudomonas aeruginosa and Enterococcus faecalis. -Continue IV cefepime through 6/22 and IV vancomycin through 6/25. Anaphylaxis to penicillin. -Clinically improving -Routine catheter care  Acute metabolic encephalopathy-likely due to the above.Seems to have resolved. -Treat sepsis as above  Neurogenic bladder with chronic indwelling Foley-patient pulled out Foley prior to admission and came back to ED by SNF for replacement -Foley catheter changed in the ED on admission 6/13, continue routine catheter care -Need outpatient follow-up with urology.  Schizophrenia/bipolar disorder -on3 antipsychotics.Family reports his antipsychotics were reduced when he was hospitalized last  month. Family concerned about worsening delusion after he confusedindwelling Foley for snake and pulled it outthat led to multiple ED visits and current admission. Psych consulted and recommended continuing current regimen.,  We suspect his UTI and sepsis were contributing to worsening confusion -Appreciate psych input-continue current regimen -Optimize Mg and K -I am slightly concerned for oversedation, will decrease a.m. dose of Risperdal to 2 mg and keep on 4 mg at p.m., also on Abilify, Depakote and Cogentin  Chronic COPD/asthma:No PFT on file. -Continue breathing treatments  Iron deficiency anemia: -Stable,iron sat 14 with TIBC of 343 and ferritin of 86. -P.o. ferrous sulfate with bowel regimen  DVT prophylaxis: Heparin SQ Code Status: Full code  Family Communication: No family at bedside  Disposition Plan: SNF on 6/25 after IV antibiotic course completed   Consultants:   Psychiatry  Procedures:  None  Antimicrobials: Anti-infectives (From admission, onward)   Start     Dose/Rate Route Frequency Ordered Stop   10/01/19 1400  ceFEPIme (MAXIPIME) 2 g in sodium chloride 0.9 % 100 mL IVPB        2 g 200 mL/hr over 30 Minutes Intravenous Every 8 hours 10/01/19 1244 10/08/19 2207   10/01/19 1300  vancomycin (VANCOREADY) IVPB 1500 mg/300 mL     Discontinue     1,500 mg 150 mL/hr over 120 Minutes Intravenous Every 12 hours 10/01/19 1246 10/11/19 1759   09/30/19 1000  ceFEPIme (MAXIPIME) 2 g in sodium chloride 0.9 % 100 mL IVPB  Status:  Discontinued        2 g 200 mL/hr over 30 Minutes Intravenous Every 12 hours 09/29/19 2120 10/01/19 1244   09/29/19 2200  ceFEPIme (MAXIPIME) 2 g in sodium chloride 0.9 % 100 mL IVPB        2 g 200 mL/hr over  30 Minutes Intravenous  Once 09/29/19 2120 09/29/19 2305   09/29/19 2130  ceFEPIme (MAXIPIME) 2 g in sodium chloride 0.9 % 100 mL IVPB  Status:  Discontinued        2 g 200 mL/hr over 30 Minutes Intravenous  Once 09/29/19 2116  09/29/19 2120   09/29/19 2000  cefTRIAXone (ROCEPHIN) 2 g in sodium chloride 0.9 % 100 mL IVPB        2 g 200 mL/hr over 30 Minutes Intravenous  Once 09/29/19 1956 09/29/19 2224       Subjective: -No events overnight, patient seen and examined, remains drowsy Objective: Vitals:   10/08/19 2159 10/09/19 0409 10/09/19 0825 10/09/19 0905  BP:    (!) 100/39  Pulse:  73  79  Resp:  15  18  Temp:  98.1 F (36.7 C)  98.2 F (36.8 C)  TempSrc:  Axillary  Oral  SpO2:  96% 95% 99%  Weight: 69.7 kg       Intake/Output Summary (Last 24 hours) at 10/09/2019 1120 Last data filed at 10/09/2019 0900 Gross per 24 hour  Intake 1586.91 ml  Output 5875 ml  Net -4288.09 ml   Filed Weights   10/05/19 2100 10/07/19 2142 10/08/19 2159  Weight: 67.9 kg 68.5 kg 69.7 kg    Examination:  General exam: Somnolent male, sitting up in bed, arousable, oriented to self and partly to place  respiratory system: Clear to auscultation. Respiratory effort normal. Cardiovascular system: S1 & S2 heard, RRR.  Gastrointestinal system: Abdomen is nondistended, soft and nontender.,  Chronic Foley Central nervous system: Alert and oriented. No focal neurological deficits. Extremities: No edema Skin: No rashes on exposed skin Psychiatry: calm   Data Reviewed: I have personally reviewed following labs and imaging studies  CBC: Recent Labs  Lab 10/03/19 0551  HGB 10.2*  HCT 58.0*   Basic Metabolic Panel: Recent Labs  Lab 10/03/19 0551 10/05/19 0830 10/06/19 0902 10/07/19 0544  NA 141  --   --  142  K 4.1 4.2 3.6 3.8  CL 106  --   --  109  CO2 25  --   --  26  GLUCOSE 95  --   --  87  BUN 11  --   --  16  CREATININE 0.80  --   --  0.94  CALCIUM 8.4*  --   --  8.3*  MG 2.0 2.1 2.1  --   PHOS 4.2  --   --   --    GFR: Estimated Creatinine Clearance: 88.6 mL/min (by C-G formula based on SCr of 0.94 mg/dL). Liver Function Tests: Recent Labs  Lab 10/03/19 0551  ALBUMIN 2.4*    Recent  Results (from the past 240 hour(s))  Culture, blood (single) w Reflex to ID Panel     Status: None   Collection Time: 09/29/19  6:06 PM   Specimen: BLOOD  Result Value Ref Range Status   Specimen Description BLOOD LEFT ANTECUBITAL  Final   Special Requests   Final    BOTTLES DRAWN AEROBIC AND ANAEROBIC Blood Culture adequate volume   Culture   Final    NO GROWTH 5 DAYS Performed at [facility], [address] [place], [place] [address]    Report Status 10/04/2019 FINAL  Final  SARS Coronavirus 2 by RT PCR (hospital order, performed in [facility] hospital lab) Nasopharyngeal Nasopharyngeal Swab     Status: None   Collection Time: 09/29/19  8:52 PM  Specimen: Nasopharyngeal Swab  Result Value Ref Range Status   SARS Coronavirus 2 NEGATIVE NEGATIVE Final    Comment: (NOTE) SARS-CoV-2 target nucleic acids are NOT DETECTED.  The SARS-CoV-2 RNA is generally detectable in upper and lower respiratory specimens during the acute phase of infection. The lowest concentration of SARS-CoV-2 viral copies this assay can detect is 250 copies / mL. A negative result does not preclude SARS-CoV-2 infection and should not be used as the sole basis for treatment or other patient management decisions.  A negative result may occur with improper specimen collection / handling, submission of specimen other than nasopharyngeal swab, presence of viral mutation(s) within the areas targeted by this assay, and inadequate number of viral copies (<250 copies / mL). A negative result must be combined with clinical observations, patient history, and epidemiological information.  Fact Sheet for Patients:   StrictlyIdeas.no  Fact Sheet for Healthcare Providers: BankingDealers.co.za  This test is not yet approved or  cleared by the [place] FDA and has been authorized for detection and/or diagnosis of SARS-CoV-2 by FDA under an Emergency Use  Authorization (EUA).  This EUA will remain in effect (meaning this test can be used) for the duration of the COVID-19 declaration under Section 564(b)(1) of the Act, 21 U.S.C. section 360bbb-3(b)(1), unless the authorization is terminated or revoked sooner.  Performed at [facility], [place] [address], [place], [place] [address]   Culture, Urine     Status: Abnormal   Collection Time: 09/30/19  3:43 AM   Specimen: Urine, Catheterized  Result Value Ref Range Status   Specimen Description URINE, CATHETERIZED  Final   Special Requests   Final    NONE Performed at [facility], [place] [address], [place], [place] [address]    Culture (A)  Final    >=100,000 COLONIES/mL PSEUDOMONAS AERUGINOSA >=100,000 COLONIES/mL ENTEROCOCCUS FAECALIS    Report Status 10/02/2019 FINAL  Final   Organism ID, Bacteria PSEUDOMONAS AERUGINOSA (A)  Final   Organism ID, Bacteria ENTEROCOCCUS FAECALIS (A)  Final      Susceptibility   Enterococcus faecalis - MIC*    AMPICILLIN <=2 SENSITIVE Sensitive     NITROFURANTOIN <=16 SENSITIVE Sensitive     VANCOMYCIN 1 SENSITIVE Sensitive     * >=100,000 COLONIES/mL ENTEROCOCCUS FAECALIS   Pseudomonas aeruginosa - MIC*    CEFTAZIDIME <=1 SENSITIVE Sensitive     CIPROFLOXACIN <=0.25 SENSITIVE Sensitive     GENTAMICIN <=1 SENSITIVE Sensitive     IMIPENEM 2 SENSITIVE Sensitive     PIP/TAZO <=4 SENSITIVE Sensitive     CEFEPIME <=1 SENSITIVE Sensitive     * >=100,000 COLONIES/mL PSEUDOMONAS AERUGINOSA  Culture, blood (routine x 2)     Status: None   Collection Time: 09/30/19  8:12 AM   Specimen: BLOOD RIGHT ARM  Result Value Ref Range Status   Specimen Description BLOOD RIGHT ARM  Final   Special Requests   Final    BOTTLES DRAWN AEROBIC ONLY Blood Culture adequate volume   Culture   Final    NO GROWTH 5 DAYS Performed at [facility], [address] [place], [place] [address]    Report Status 10/05/2019 FINAL  Final  Culture, blood (routine x  2)     Status: None   Collection Time: 09/30/19  8:13 AM   Specimen: BLOOD RIGHT HAND  Result Value Ref Range Status   Specimen Description BLOOD RIGHT HAND  Final   Special Requests   Final  BOTTLES DRAWN AEROBIC ONLY Blood Culture adequate volume   Culture   Final    NO GROWTH 5 DAYS Performed at [facility], [place] [address] [place], [place] [address]    Report Status 10/05/2019 FINAL  Final  MRSA PCR Screening     Status: None   Collection Time: 09/30/19  8:50 AM   Specimen: Nasopharyngeal  Result Value Ref Range Status   MRSA by PCR NEGATIVE NEGATIVE Final    Comment:        The GeneXpert MRSA Assay (FDA approved for NASAL specimens only), is one component of a comprehensive MRSA colonization surveillance program. It is not intended to diagnose MRSA infection nor to guide or monitor treatment for MRSA infections. Performed at [facility], [place] [address], [place], [place] [address]       Radiology Studies: No results found.   Scheduled Meds:  ARIPiprazole  10 mg Oral BID   aspirin EC  81 mg Oral BID   benztropine  0.5 mg Oral BID   calcium-vitamin D  1 tablet Oral Q breakfast   Chlorhexidine Gluconate Cloth  6 each Topical Daily   divalproex  125 mg Oral TID   ferrous sulfate  325 mg Oral BID WC   heparin injection (subcutaneous)  5,000 Units Subcutaneous Q8H   meloxicam  7.5 mg Oral Daily   mometasone-formoterol  2 puff Inhalation BID   multivitamin with minerals  1 tablet Oral Daily   OLANZapine  10 mg Oral BID   pantoprazole  40 mg Oral Daily   risperiDONE  4 mg Oral BID   Continuous Infusions:  sodium chloride 10 mL/hr at 10/01/19 1800   sodium chloride     lactated ringers 75 mL/hr at 10/09/19 0401   vancomycin 1,500 mg (10/09/19 0557)     LOS: 9 days    Domenic Polite, MD 10/09/2019 11:20 AM  10/09/2019, 11:20 AM

## 2019-10-09 NOTE — Progress Notes (Signed)
Pharmacy Antibiotic Note  Lee House is a 54 y.o. male admitted on 09/29/2019 with sepsis; pt has neurogenic bladder, with Foley catheter.  Pharmacy has been consulted for vancomycin dosing dosing.  Patient's sepsis likely due to a urinary tract source. Urine culture revealed >100,000 colonies/ml each of Pseudomonas aeruginosa and Enterococcus faecalis. Vancomycin utilized for enterococcus coverage due to penicillin allergy. Cefepime therapy completed.     Today patient is afebrile, last WBC WNL,was therapeutic, and last serum creatinine obtained was stable.  Current regimen is  vancomycin 1500 mg IV q12h x 10 days to end 6/25.  Plan: Continue vancomycin as currently ordered Obtain BMET tomorrow am Monitor WBC, temp, clinical improvement, any new cultures, renal function, vancomycin levels if clinically indicated.  Temp (24hrs), Avg:98.1 F (36.7 C), Min:97.6 F (36.4 C), Max:98.8 F (37.1 C)  Recent Labs  Lab 10/03/19 0551 10/03/19 1732 10/07/19 0544  CREATININE 0.80  --  0.94  VANCOTROUGH  --  18  --     Estimated Creatinine Clearance: 88.6 mL/min (by C-G formula based on SCr of 0.94 mg/dL).    Allergies  Allergen Reactions  . Penicillins Anaphylaxis    Antimicrobials this admission: Ceftriaxone X 1 6/13 Cefepime 6/13 >> 6/22 Vancomycin 6/15 >> (6/25)  Microbiology results: 6/13 Bld cx: NGTD 6/14 Urine cx: >100,000 colonies/ml each of Pseudomonas aerguinosa (suscept to cefepime), Enterococcus faecalis (suscept to vanc) 6/14 Bld cx X 2:  NGTD 6/14 MRSA PCR: negative  Detrice Cales A. Levada Dy, PharmD, BCPS, [facility] Clinical Pharmacist [place] Please utilize Amion for appropriate phone number to reach the unit pharmacist ([place])   10/09/2019, 8:44 AM

## 2019-10-09 NOTE — Progress Notes (Signed)
Occupational Therapy Treatment Patient Details Name: Lee House MRN: [id number] DOB: 1966-02-22 Today's Date: 10/09/2019    History of present illness Pt is a 54 yo male presenting with septic shock due to complicated UTI in the presence of chronic Foley use. PMH includes: schizophrenia/paranoia, bipolar disorder, COPD/asthma and neurogenic bladder with chronic Foley.   OT comments  Pt progressing toward ADL independence. Needing moderate assist to don front opening gown. Ambulated to sink to wash hair and perform grooming with min guard assist. Pt remained up in chair at end of session eating a snack. Pt remains highly impulsive with poor safety awareness. Goals updated.  Follow Up Recommendations  SNF;Supervision/Assistance - 24 hour    Equipment Recommendations  Other (comment) (defer to next venue)    Recommendations for Other Services      Precautions / Restrictions Precautions Precautions: Fall Precaution Comments: recent admission for R hip fx       Mobility Bed Mobility Overal bed mobility: Needs Assistance Bed Mobility: Supine to Sit     Supine to sit: Supervision     General bed mobility comments: HOB up  Transfers Overall transfer level: Needs assistance Equipment used: Rolling walker (2 wheeled) Transfers: Sit to/from Stand Sit to Stand: Min guard         General transfer comment: min guard for safety, IV line    Balance Overall balance assessment: Needs assistance   Sitting balance-Leahy Scale: Good     Standing balance support: Single extremity supported Standing balance-Leahy Scale: Fair Standing balance comment: stabilized with one hand on sink, but able to stand without UE support                           ADL either performed or assessed with clinical judgement   ADL Overall ADL's : Needs assistance/impaired Eating/Feeding: Independent;Sitting   Grooming: Oral care;Brushing hair;Min guard;Standing (washed  hair) Grooming Details (indicate cue type and reason): assisted to wash hair in sink to minimize water on floor, pt brushed his teeth placing toothpaste on brush x 3         Upper Body Dressing : Moderate assistance;Sitting Upper Body Dressing Details (indicate cue type and reason): front opening gown                 Functional mobility during ADLs: Min guard;Rolling walker;Cueing for safety General ADL Comments: pt impulsive, mod v/c's for RW management      Vision       Perception     Praxis      Cognition Arousal/Alertness: Awake/alert Behavior During Therapy: Impulsive Overall Cognitive Status: Impaired/Different from baseline Area of Impairment: Attention;Memory;Following commands;Safety/judgement;Orientation;Awareness                 Orientation Level: Disoriented to;Time Current Attention Level: Selective Memory: Decreased short-term memory Following Commands: Follows multi-step commands inconsistently Safety/Judgement: Decreased awareness of deficits;Decreased awareness of safety Awareness: Intellectual            Exercises     Shoulder Instructions       General Comments      Pertinent Vitals/ Pain       Pain Assessment: Faces Faces Pain Scale: No hurt  Home Living Family/patient expects to be discharged to:: Skilled nursing facility  Prior Functioning/Environment Level of Independence: Needs assistance            Frequency  Min 2X/week        Progress Toward Goals  OT Goals(current goals can now be found in the care plan section)  Progress towards OT goals: Progressing toward goals  Acute Rehab OT Goals Patient Stated Goal: to eat lunch OT Goal Formulation: With patient  Plan Discharge plan remains appropriate    Co-evaluation                 AM-PAC OT "6 Clicks" Daily Activity     Outcome Measure   Help from another person eating meals?: None Help  from another person taking care of personal grooming?: A Little Help from another person toileting, which includes using toliet, bedpan, or urinal?: A Little Help from another person bathing (including washing, rinsing, drying)?: A Little Help from another person to put on and taking off regular upper body clothing?: A Lot Help from another person to put on and taking off regular lower body clothing?: A Little 6 Click Score: 18    End of Session Equipment Utilized During Treatment: Gait belt;Rolling walker  OT Visit Diagnosis: Unsteadiness on feet (R26.81);Muscle weakness (generalized) (M62.81);Pain   Activity Tolerance Patient tolerated treatment well   Patient Left in chair;with call bell/phone within reach;with chair alarm set   Nurse Communication          Time: [phone] OT Time Calculation (min): 22 min  Charges: OT General Charges $OT Visit: 1 Visit OT Treatments $Self Care/Home Management : 8-22 mins  Nestor Lewandowsky, OTR/L Acute Rehabilitation Services Pager: [phone] Office: [phone]   Malka So 10/09/2019, 12:57 PM

## 2019-10-10 LAB — BASIC METABOLIC PANEL
Anion gap: 10 (ref 5–15)
BUN: 15 mg/dL (ref 6–20)
CO2: 25 mmol/L (ref 22–32)
Calcium: 8.7 mg/dL — ABNORMAL LOW (ref 8.9–10.3)
Chloride: 105 mmol/L (ref 98–111)
Creatinine, Ser: 0.85 mg/dL (ref 0.61–1.24)
GFR calc Af Amer: >60 mL/min (ref >60)
GFR calc non Af Amer: >60 mL/min (ref >60)
Glucose, Bld: 97 mg/dL (ref 70–99)
Potassium: 3.9 mmol/L (ref 3.5–5.1)
Sodium: 140 mmol/L (ref 135–145)

## 2019-10-10 NOTE — Plan of Care (Signed)
  Problem: Safety: Goal: Ability to remain free from injury will improve Outcome: Progressing   

## 2019-10-10 NOTE — Progress Notes (Signed)
Physical Therapy Treatment Patient Details Name: Lee House MRN: [id number] DOB: 09-29-1965 Today's Date: 10/10/2019    History of Present Illness Pt is a 54 yo male presenting with septic shock due to complicated UTI in the presence of chronic Foley use. PMH includes: schizophrenia/paranoia, bipolar disorder, COPD/asthma and neurogenic bladder with chronic Foley.    PT Comments    Pt very impulsive this session and very particular about room set up as well as line management (preferring to keep all lines behind him). He was able to perform bed mobility with supervision, transfers with supervision and ambulated a very short distance with RW and min guard for safety and line management. Pt hyper-focused on getting to the chair to eat his snacks, not interested in further gait training at this time. Pt would continue to benefit from skilled physical therapy services at this time while admitted and after d/c to address the below listed limitations in order to improve overall safety and independence with functional mobility.   Follow Up Recommendations  SNF;Supervision/Assistance - 24 hour     Equipment Recommendations  None recommended by PT    Recommendations for Other Services       Precautions / Restrictions Precautions Precautions: Fall Precaution Comments: recent admission for R hip fx Restrictions Weight Bearing Restrictions: Yes RLE Weight Bearing: Weight bearing as tolerated Other Position/Activity Restrictions: WBAT per Care Everywhere notes    Mobility  Bed Mobility Overal bed mobility: Needs Assistance Bed Mobility: Supine to Sit     Supine to sit: Supervision        Transfers Overall transfer level: Needs assistance Equipment used: Rolling walker (2 wheeled) Transfers: Sit to/from Stand Sit to Stand: Min guard         General transfer comment: min guard for safety, IV line  Ambulation/Gait Ambulation/Gait assistance: Min guard Gait Distance  (Feet): 5 Feet Assistive device: Rolling walker (2 wheeled) Gait Pattern/deviations: Step-through pattern;Decreased stride length Gait velocity: decreased   General Gait Details: pt very focused on transitioning to sitting up in chair and eating his snacks; min guard for safety and line management with use of RW for stability   Stairs             Wheelchair Mobility    Modified Rankin (Stroke Patients Only)       Balance Overall balance assessment: Needs assistance Sitting-balance support: Feet supported Sitting balance-Leahy Scale: Good     Standing balance support: During functional activity Standing balance-Leahy Scale: Fair                              Cognition Arousal/Alertness: Awake/alert Behavior During Therapy: Impulsive;Flat affect Overall Cognitive Status: Impaired/Different from baseline Area of Impairment: Attention;Memory;Following commands;Safety/judgement;Awareness;Problem solving                   Current Attention Level: Selective Memory: Decreased short-term memory Following Commands: Follows multi-step commands inconsistently Safety/Judgement: Decreased awareness of deficits;Decreased awareness of safety Awareness: Intellectual Problem Solving: Requires verbal cues;Difficulty sequencing        Exercises      General Comments        Pertinent Vitals/Pain Pain Assessment: Faces Faces Pain Scale: No hurt    Home Living                      Prior Function            PT Goals (current goals can  now be found in the care plan section) Acute Rehab PT Goals PT Goal Formulation: With patient Time For Goal Achievement: 10/17/19 Potential to Achieve Goals: Good Progress towards PT goals: Progressing toward goals    Frequency    Min 2X/week      PT Plan Current plan remains appropriate    Co-evaluation              AM-PAC PT "6 Clicks" Mobility   Outcome Measure  Help needed turning from  your back to your side while in a flat bed without using bedrails?: None Help needed moving from lying on your back to sitting on the side of a flat bed without using bedrails?: None Help needed moving to and from a bed to a chair (including a wheelchair)?: A Little Help needed standing up from a chair using your arms (e.g., wheelchair or bedside chair)?: None Help needed to walk in hospital room?: A Little Help needed climbing 3-5 steps with a railing? : A Lot 6 Click Score: 20    End of Session   Activity Tolerance: Patient tolerated treatment well Patient left: in chair;with call bell/phone within reach;with chair alarm set Nurse Communication: Mobility status PT Visit Diagnosis: Unsteadiness on feet (R26.81);Other abnormalities of gait and mobility (R26.89);Difficulty in walking, not elsewhere classified (R26.2)     Time: [id number] PT Time Calculation (min) (ACUTE ONLY): 10 min  Charges:  $Therapeutic Activity: 8-22 mins                     Anastasio Champion, DPT  Acute Rehabilitation Services Pager [phone] Office [place] 10/10/2019, 11:52 AM

## 2019-10-10 NOTE — Progress Notes (Addendum)
Patient ID: Lee House, male   DOB: Sep 14, 1965, 54 y.o.   MRN: [id number]  PROGRESS NOTE    Lee House  IWP:[id number] DOB: 12/31/1965 DOA: 09/29/2019 PCP: Patient, [name] Per    Brief Narrative:  54 year old male with history of schizophrenia/paranoia, bipolar disorder, COPD/asthma and neurogenic bladder with chronic Foley. Patient presented to [facility] on 6/12 after pulling his Foley catheter but refused to have Foley replaced and discharged back to SNF just to be sent back to ED on 6/13 for Foley placement. Patient was admitted with catheter associated UTI but became febrile and persistently hypotensive despite IV fluid boluses with mental status change. He was transferred to ICU for vasopressor support. Antibiotics changed to cefepime.  While in ICU, urine culture grew Pseudomonas aeruginosa and subsequently Enterococcus faecalis.  He is allergic to penicillin. Vancomycin added.Weaned off vasopressors, blood pressure stabilized and was transferred to [facility] service,  Has also been seen by psych for medication management  Assessment & Plan:  Severe sepsis with septic shock due to complicated UTI in patient with history of chronic Foley -Urine culture with Pseudomonas aeruginosa and Enterococcus faecalis. -Continued IV cefepime through 6/22 and continue IV vancomycin through 6/25. Anaphylaxis to penicillin. -Clinically improving -Routine catheter care  -Foley catheter was changed on admission 5/05  Acute metabolic encephalopathy-likely due to the above. -Improved . -Treat sepsis as above -He does have some cognitive deficits which are likely secondary to numerous antipsychotics for schizophrenia  Neurogenic bladder with chronic indwelling Foley-patient pulled out Foley prior to admission and came back to ED by SNF for replacement -Foley catheter changed in the ED on admission 6/13, continue routine catheter care -Need outpatient follow-up with  urology.  Schizophrenia/bipolar disorder -on3 antipsychotics.Family concerned about worsening delusion after he confusedindwelling Foley for snake and pulled it outthat led to multiple ED visits and current admission. Psych consulted and recommended continuing current regimen.,  We suspect his UTI and sepsis were contributing to worsening confusion -Appreciate psych input-continue current regimen -I was slightly concerned for oversedation, hence I decreased his morning. dose of Risperdal to 2 mg and kept him on 4 mg at p.m., also continued on Abilify, Depakote and Cogentin -Follow-up with psychiatry, titrate down meds as tolerated  Chronic COPD/asthma:No PFT on file. -Continue breathing treatments  Iron deficiency anemia: -Stable,iron sat 14 with TIBC of 343 and ferritin of 86. -P.o. ferrous sulfate with bowel regimen  DVT prophylaxis: Heparin SQ Code Status: Full code  Family Communication: No family at bedside  Disposition Plan: SNF on 6/25 after IV antibiotic course completed   Consultants:   Psychiatry  Procedures:  None  Antimicrobials: Anti-infectives (From admission, onward)   Start     Dose/Rate Route Frequency Ordered Stop   10/01/19 1400  ceFEPIme (MAXIPIME) 2 g in sodium chloride 0.9 % 100 mL IVPB        2 g 200 mL/hr over 30 Minutes Intravenous Every 8 hours 10/01/19 1244 10/08/19 2207   10/01/19 1300  vancomycin (VANCOREADY) IVPB 1500 mg/300 mL     Discontinue     1,500 mg 150 mL/hr over 120 Minutes Intravenous Every 12 hours 10/01/19 1246 10/11/19 1759   09/30/19 1000  ceFEPIme (MAXIPIME) 2 g in sodium chloride 0.9 % 100 mL IVPB  Status:  Discontinued        2 g 200 mL/hr over 30 Minutes Intravenous Every 12 hours 09/29/19 2120 10/01/19 1244   09/29/19 2200  ceFEPIme (MAXIPIME) 2 g in sodium chloride 0.9 % 100  mL IVPB        2 g 200 mL/hr over 30 Minutes Intravenous  Once 09/29/19 2120 09/29/19 2305   09/29/19 2130  ceFEPIme (MAXIPIME) 2 g in  sodium chloride 0.9 % 100 mL IVPB  Status:  Discontinued        2 g 200 mL/hr over 30 Minutes Intravenous  Once 09/29/19 2116 09/29/19 2120   09/29/19 2000  cefTRIAXone (ROCEPHIN) 2 g in sodium chloride 0.9 % 100 mL IVPB        2 g 200 mL/hr over 30 Minutes Intravenous  Once 09/29/19 1956 09/29/19 2224       Subjective: -Patient seen and examined, no events overnight, continues to have cognitive deficits, eating okay,  Objective: Vitals:   10/09/19 2116 10/10/19 0429 10/10/19 0805 10/10/19 0821  BP: 101/65 107/64 109/65   Pulse: 83 80 87   Resp: 18 18 18    Temp: 98.2 F (36.8 C) 97.8 F (36.6 C) 97.9 F (36.6 C)   TempSrc: Oral Axillary Oral   SpO2: 98% 93% 100% 97%  Weight: 71.7 kg       Intake/Output Summary (Last 24 hours) at 10/10/2019 1415 Last data filed at 10/10/2019 0900 Gross per 24 hour  Intake 1593.51 ml  Output 6200 ml  Net -4606.49 ml   Filed Weights   10/07/19 2142 10/08/19 2159 10/09/19 2116  Weight: 68.5 kg 69.7 kg 71.7 kg    Examination:  General, awake alert, oriented to self and partly to place, cognitive deficits noted CVS: S1-S2, regular rate rhythm Lungs with decreased breath sounds at both bases Abdomen: Soft, nontender, chronic Foley catheter Extremities: No edema Skin: No rashes on exposed skin Psychiatry: calm   Data Reviewed: I have personally reviewed following labs and imaging studies  CBC: No results for input(s): WBC, NEUTROABS, HGB, HCT, MCV, PLT in the last 168 hours. Basic Metabolic Panel: Recent Labs  Lab 10/05/19 0830 10/06/19 0902 10/07/19 0544 10/10/19 0500  NA  --   --  142 140  K 4.2 3.6 3.8 3.9  CL  --   --  109 105  CO2  --   --  26 25  GLUCOSE  --   --  87 97  BUN  --   --  16 15  CREATININE  --   --  0.94 0.85  CALCIUM  --   --  8.3* 8.7*  MG 2.1 2.1  --   --    GFR: Estimated Creatinine Clearance: 100.8 mL/min (by C-G formula based on SCr of 0.85 mg/dL). Liver Function Tests: No results for input(s):  AST, ALT, ALKPHOS, BILITOT, PROT, ALBUMIN in the last 168 hours.  No results found for this or any previous visit (from the past 240 hour(s)).    Radiology Studies: No results found.   Scheduled Meds:  ARIPiprazole  10 mg Oral BID   aspirin EC  81 mg Oral BID   benztropine  0.5 mg Oral BID   calcium-vitamin D  1 tablet Oral Q breakfast   Chlorhexidine Gluconate Cloth  6 each Topical Daily   divalproex  125 mg Oral TID   ferrous sulfate  325 mg Oral BID WC   heparin injection (subcutaneous)  5,000 Units Subcutaneous Q8H   meloxicam  7.5 mg Oral Daily   mometasone-formoterol  2 puff Inhalation BID   multivitamin with minerals  1 tablet Oral Daily   OLANZapine  10 mg Oral BID   pantoprazole  40 mg Oral Daily  risperiDONE  2 mg Oral Daily   And   risperiDONE  4 mg Oral QHS   saccharomyces boulardii  250 mg Oral BID   Continuous Infusions:  sodium chloride 10 mL/hr at 10/01/19 1800   sodium chloride     vancomycin 1,500 mg (10/10/19 0518)     LOS: 10 days    Domenic Polite, MD 10/10/2019 2:15 PM  10/10/2019, 2:15 PM

## 2019-10-11 LAB — BASIC METABOLIC PANEL
Anion gap: 12 (ref 5–15)
BUN: 19 mg/dL (ref 6–20)
CO2: 22 mmol/L (ref 22–32)
Calcium: 8.7 mg/dL — ABNORMAL LOW (ref 8.9–10.3)
Chloride: 106 mmol/L (ref 98–111)
Creatinine, Ser: 0.8 mg/dL (ref 0.61–1.24)
GFR calc Af Amer: >60 mL/min (ref >60)
GFR calc non Af Amer: >60 mL/min (ref >60)
Glucose, Bld: 101 mg/dL — ABNORMAL HIGH (ref 70–99)
Potassium: 4 mmol/L (ref 3.5–5.1)
Sodium: 140 mmol/L (ref 135–145)

## 2019-10-11 LAB — CBC
HCT: 36.2 % — ABNORMAL LOW (ref 39.0–52.0)
Hemoglobin: 11.1 g/dL — ABNORMAL LOW (ref 13.0–17.0)
MCH: 27.5 pg (ref 26.0–34.0)
MCHC: 30.7 g/dL (ref 30.0–36.0)
MCV: 89.8 fL (ref 80.0–100.0)
Platelets: 258 10*3/uL (ref 150–400)
RBC: 4.03 MIL/uL — ABNORMAL LOW (ref 4.22–5.81)
RDW: 15.2 % (ref 11.5–15.5)
WBC: 10.1 10*3/uL (ref 4.0–10.5)
nRBC: 0 % (ref 0.0–0.2)

## 2019-10-11 LAB — SARS CORONAVIRUS 2 (TAT 6-24 HRS): SARS Coronavirus 2: NEGATIVE

## 2019-10-11 MED ORDER — RISPERIDONE 4 MG PO TABS: 2.0000 mg | ORAL_TABLET | Freq: Two times a day (BID) | ORAL | Status: DC

## 2019-10-11 MED ORDER — ALPRAZOLAM 0.25 MG PO TABS: 0.2500 mg | ORAL_TABLET | Freq: Two times a day (BID) | ORAL | 0 refills | Status: AC | PRN

## 2019-10-11 MED ORDER — ACETAMINOPHEN 325 MG PO TABS: 650.0000 mg | ORAL_TABLET | Freq: Four times a day (QID) | ORAL | Status: AC | PRN

## 2019-10-11 MED ORDER — OXYCODONE HCL 5 MG PO TABS: 5.0000 mg | ORAL_TABLET | Freq: Four times a day (QID) | ORAL | 0 refills | Status: DC | PRN

## 2019-10-11 NOTE — Discharge Summary (Addendum)
Physician Discharge Summary  Lexander Tremblay OQH:[id number] DOB: 1965-06-17 DOA: 09/29/2019  PCP: Patient, [name] Per  Admit date: 09/29/2019 Discharge date: 10/14/2019  Time spent: 35 minutes  Recommendations for Outpatient Follow-up:  Routine catheter care, change Foley catheter every 30 days, last changed on 6/13 Follow-up with urology MD psychiatry in 1 month, titrate down antipsychotics as tolerated Discharge back to skilled nursing care  Discharge Diagnoses:  Severe sepsis Septic shock Acute metabolic encephalopathy Neurogenic bladder with chronic indwelling Foley catheter   Catheter-associated urinary tract infection ([place]) Active Problems:   Lower urinary tract infectious disease   Schizophrenia ([place])   Neurogenic bladder   Sepsis ([place]) COPD Iron deficiency anemia   Discharge Condition: Stable  Diet recommendation: Regular  Filed Weights   10/08/19 2159 10/09/19 2116 10/10/19 2120  Weight: 69.7 kg 71.7 kg 71 kg    History of present illness:  54 year old male with history of schizophrenia/paranoia, bipolar disorder, COPD/asthma and neurogenic bladder with chronic Foley. Patient presented to [facility] on 6/12 after pulling his Foley catheter but refused to have Foley replaced and discharged back to SNF just to be sent back to ED on 6/13 for Foley placement. Patient was admitted with catheter associated UTI but became febrile and persistently hypotensive despite IV fluid boluses with mental status change. He was transferred to ICU for vasopressor support  Hospital Course:    Severe sepsis with septic shock due to complicated UTI in patient with history of chronic Foley -Admitted to the ICU with septic shock, required pressor support -Urine culture grew Pseudomonas aeruginosa and Enterococcus faecalis. -Completed antibiotic course of cefepime and IV vancomycin for this. -Clinically improving -Foley catheter was changed on admission 6/13 -Continue routine catheter  care, change it every 30 days, follow-up with urology  Acute metabolic encephalopathy -Improved . -He does have some cognitive deficits which are likely secondary to numerous antipsychotics for schizophrenia  Neurogenic bladder with chronic indwelling Foley-patient pulled out Foley prior to admission and came back to ED by SNF for replacement -Foley catheter changed in the ED on admission 6/13, continue routine catheter care -Need outpatient follow-up with urology.  Schizophrenia/bipolar disorder -on3 antipsychotics at baseline which were continued -psych consulted and recommended continuing current regimen.,  We suspect his UTI and sepsis were contributing to worsening confusion -Appreciate psych input-continue current regimen -I was slightly concerned for oversedation, hence I decreased his morning. dose of Risperdal to 2 mg and kept him on 4 mg at p.m., also continued on Abilify, Depakote and Cogentin -Follow-up with psychiatry, titrate down meds as tolerated  Chronic COPD/asthma:No PFT on file. -Continue breathing treatments  Iron deficiency anemia: -Stable,iron sat 14 with TIBC of 343 and ferritin of 86. -P.o. ferrous sulfate with bowel regimen   Discharge Exam: Vitals:   10/11/19 0815 10/11/19 0924  BP:  113/65  Pulse:  100  Resp:  18  Temp:  97.8 F (36.6 C)  SpO2: 97% 98%    General: Awake alert, oriented to self and place, only partly to time, cognitive deficits noted Cardiovascular: S1-S2, regular rate rhythm Respiratory: Clear  Discharge Instructions   Discharge Instructions    Diet - low sodium heart healthy   Complete by: As directed    Increase activity slowly   Complete by: As directed      Allergies as of 10/11/2019      Reactions   Penicillins Anaphylaxis      Medication List    TAKE these medications   acetaminophen 325 MG tablet  Commonly known as: TYLENOL Take 2 tablets (650 mg total) by mouth every 6 (six) hours as needed for  mild pain (or Fever >/= 101).   albuterol 108 (90 Base) MCG/ACT inhaler Commonly known as: VENTOLIN HFA Inhale 2 puffs into the lungs every 4 (four) hours as needed for shortness of breath.   ALPRAZolam 0.25 MG tablet Commonly known as: XANAX Take 1 tablet (0.25 mg total) by mouth 2 (two) times daily as needed for anxiety.   ARIPiprazole 10 MG tablet Commonly known as: ABILIFY Take 10 mg by mouth in the morning and at bedtime.   aspirin EC 81 MG tablet Take 81 mg by mouth in the morning and at bedtime. Swallow whole.   benztropine 0.5 MG tablet Commonly known as: COGENTIN Take 0.5 mg by mouth 2 (two) times daily.   CALCIUM 500 +D PO Take 1 tablet by mouth daily.   Cerovite Senior Tabs Take 1 tablet by mouth daily.   divalproex 125 MG DR tablet Commonly known as: DEPAKOTE Take 125 mg by mouth 3 (three) times daily.   FeroSul 325 (65 FE) MG tablet Generic drug: ferrous sulfate Take 325 mg by mouth 2 (two) times daily with a meal.   Fluticasone-Salmeterol 250-50 MCG/DOSE Aepb Commonly known as: ADVAIR Inhale 1 puff into the lungs every 12 (twelve) hours.   meloxicam 7.5 MG tablet Commonly known as: MOBIC Take 7.5 mg by mouth daily.   OLANZapine 10 MG tablet Commonly known as: ZYPREXA Take 10 mg by mouth 2 (two) times daily.   oxyCODONE 5 MG immediate release tablet Commonly known as: Oxy IR/ROXICODONE Take 1 tablet (5 mg total) by mouth every 6 (six) hours as needed for moderate pain or severe pain. What changed: reasons to take this   pantoprazole 40 MG tablet Commonly known as: PROTONIX Take 40 mg by mouth daily.   risperidone 4 MG tablet Commonly known as: RISPERDAL Take 0.5-1 tablets (2-4 mg total) by mouth 2 (two) times daily. Take 2mg  in am and 4mg  in pm What changed:   how much to take  additional instructions   senna-docusate 8.6-50 MG tablet Commonly known as: Senokot-S Take 1 tablet by mouth 2 (two) times daily as needed for constipation.    tamsulosin 0.4 MG Caps capsule Commonly known as: FLOMAX Take 0.4 mg by mouth daily.   traZODone 50 MG tablet Commonly known as: DESYREL Take 50 mg by mouth at bedtime as needed for sleep.      Allergies  Allergen Reactions   Penicillins Anaphylaxis      The results of significant diagnostics from this hospitalization (including imaging, microbiology, ancillary and laboratory) are listed below for reference.    Significant Diagnostic Studies: US RENAL  Result Date: 09/29/2019 CLINICAL DATA:  History of neurogenic bladder EXAM: RENAL / URINARY TRACT ULTRASOUND COMPLETE COMPARISON:  None. FINDINGS: Right Kidney: Renal measurements: 13.6 x 4.6 x 5.1 cm. = volume: 167 mL . Echogenicity within normal limits. No mass or hydronephrosis visualized. Left Kidney: Renal measurements: 10.4 x 4.0 x 4.3 cm. = volume: 93 mL. Echogenicity within normal limits. No mass or hydronephrosis visualized. Bladder: Bladder is partially distended with a Foley catheter in place. Wall thickening is noted consistent with the given clinical history of neurogenic bladder. Other: None. IMPRESSION: Normal-appearing kidneys. Wall thickening in the bladder consistent with the given clinical history. Foley catheter is noted in place. Electronically Signed   By: Inez Catalina M.D.   On: 09/29/2019 23:23   DG Chest [facility]  1 View  Result Date: 09/29/2019 CLINICAL DATA:  Tired.  Question pneumonia. EXAM: PORTABLE CHEST 1 VIEW COMPARISON:  Radiograph 09/09/2019 FINDINGS: The cardiomediastinal contours are normal. The lungs are clear. Pulmonary vasculature is normal. No consolidation, pleural effusion, or pneumothorax. No acute osseous abnormalities are seen. IMPRESSION: No evidence of pneumonia. Electronically Signed   By: Keith Rake M.D.   On: 09/29/2019 18:45   ECHOCARDIOGRAM COMPLETE  Result Date: 09/30/2019    ECHOCARDIOGRAM REPORT   Patient Name:   Lee House [facility] Date of Exam: 09/30/2019 Medical Rec #:   [id number]             Height:       73.0 in Accession #:    [id number]            Weight:       196.2 lb Date of Birth:  1965/09/27             BSA:          2.134 m Patient Age:    54 years              BP:           111/61 mmHg Patient Gender: M                     HR:           91 bpm. Exam Location:  Inpatient Procedure: 2D Echo Indications:    Septic Shock  History:        Patient has no prior history of Echocardiogram examinations.                 COPD.  Sonographer:    Mikki Santee RDCS (AE) Referring Phys: [id number] [place]  1. Left ventricular ejection fraction, by estimation, is 60 to 65%. The left ventricle has normal function. The left ventricle has no regional wall motion abnormalities. Left ventricular diastolic parameters were normal.  2. Right ventricular systolic function is normal. The right ventricular size is normal. There is normal pulmonary artery systolic pressure. The estimated right ventricular systolic pressure is 30.8 mmHg.  3. The mitral valve is normal in structure. No evidence of mitral valve regurgitation.  4. The aortic valve was not well visualized. Aortic valve regurgitation is not visualized. No aortic stenosis is present.  5. The inferior vena cava is dilated in size with <50% respiratory variability, suggesting right atrial pressure of 15 mmHg. FINDINGS  Left Ventricle: Left ventricular ejection fraction, by estimation, is 60 to 65%. The left ventricle has normal function. The left ventricle has no regional wall motion abnormalities. The left ventricular internal cavity size was normal in size. There is  no left ventricular hypertrophy. Left ventricular diastolic parameters were normal. Right Ventricle: The right ventricular size is normal. No increase in right ventricular wall thickness. Right ventricular systolic function is normal. There is normal pulmonary artery systolic pressure. The tricuspid regurgitant velocity is 2.10 m/s, and  with an assumed  right atrial pressure of 15 mmHg, the estimated right ventricular systolic pressure is 65.7 mmHg. Left Atrium: Left atrial size was normal in size. Right Atrium: Right atrial size was normal in size. Pericardium: There is no evidence of pericardial effusion. Mitral Valve: The mitral valve is normal in structure. No evidence of mitral valve regurgitation. Tricuspid Valve: The tricuspid valve is normal in structure. Tricuspid valve regurgitation is trivial. Aortic Valve: The aortic valve was not well visualized. Aortic valve regurgitation  is not visualized. No aortic stenosis is present. Pulmonic Valve: The pulmonic valve was not well visualized. Pulmonic valve regurgitation is not visualized. Aorta: The aortic root is normal in size and structure. Venous: The inferior vena cava is dilated in size with less than 50% respiratory variability, suggesting right atrial pressure of 15 mmHg. IAS/Shunts: The interatrial septum was not well visualized.  LEFT VENTRICLE PLAX 2D LVIDd:         4.60 cm  Diastology LVIDs:         3.10 cm  LV e' lateral:   14.00 cm/s LV PW:         0.90 cm  LV E/e' lateral: 5.0 LV IVS:        0.70 cm  LV e' medial:    11.70 cm/s LVOT diam:     2.20 cm  LV E/e' medial:  5.9 LV SV:         59 LV SV Index:   28 LVOT Area:     3.80 cm  RIGHT VENTRICLE RV S prime:     11.80 cm/s TAPSE (M-mode): 2.3 cm LEFT ATRIUM             Index       RIGHT ATRIUM          Index LA diam:        3.40 cm 1.59 cm/m  RA Area:     9.47 cm LA Vol (A2C):   45.6 ml 21.37 ml/m RA Volume:   17.90 ml 8.39 ml/m LA Vol (A4C):   20.2 ml 9.46 ml/m LA Biplane Vol: 33.7 ml 15.79 ml/m  AORTIC VALVE LVOT Vmax:   85.00 cm/s LVOT Vmean:  57.500 cm/s LVOT VTI:    0.156 m  AORTA Ao Root diam: 3.30 cm MITRAL VALVE               TRICUSPID VALVE MV Area (PHT): 5.02 cm    TR Peak grad:   17.6 mmHg MV Decel Time: 151 msec    TR Vmax:        210.00 cm/s MV E velocity: 69.50 cm/s MV A velocity: 64.50 cm/s  SHUNTS MV E/A ratio:  1.08         Systemic VTI:  0.16 m                            Systemic Diam: 2.20 cm Oswaldo Milian MD Electronically signed by Oswaldo Milian MD Signature Date/Time: 09/30/2019/4:29:55 PM    Final     Microbiology: No results found for this or any previous visit (from the past 240 hour(s)).   Labs: Basic Metabolic Panel: Recent Labs  Lab 10/05/19 0830 10/06/19 0902 10/07/19 0544 10/10/19 0500 10/11/19 0453  NA  --   --  142 140 140  K 4.2 3.6 3.8 3.9 4.0  CL  --   --  109 105 106  CO2  --   --  26 25 22   GLUCOSE  --   --  87 97 101*  BUN  --   --  16 15 19   CREATININE  --   --  0.94 0.85 0.80  CALCIUM  --   --  8.3* 8.7* 8.7*  MG 2.1 2.1  --   --   --    Liver Function Tests: No results for input(s): AST, ALT, ALKPHOS, BILITOT, PROT, ALBUMIN in the last 168 hours. No results for input(s): LIPASE, AMYLASE  in the last 168 hours. No results for input(s): AMMONIA in the last 168 hours. CBC: Recent Labs  Lab 10/11/19 0453  WBC 10.1  HGB 11.1*  HCT 36.2*  MCV 89.8  PLT 258   Cardiac Enzymes: No results for input(s): CKTOTAL, CKMB, CKMBINDEX, TROPONINI in the last 168 hours. BNP: BNP (last 3 results) No results for input(s): BNP in the last 8760 hours.  ProBNP (last 3 results) No results for input(s): PROBNP in the last 8760 hours.  CBG: Recent Labs  Lab 10/09/19 0634  GLUCAP 85       Signed:  Domenic Polite MD.  [facility] 10/11/2019, 11:56 AM

## 2019-10-11 NOTE — TOC Progression Note (Signed)
Transition of Care [facility]) - Progression Note    Patient Details  Name: Lee House MRN: [id number] Date of Birth: Jan 26, 1966  Transition of Care [facility]) CM/SW Contact  Sharlet Salina Mila Homer, LCSW Phone Number: 10/11/2019, 5:12 PM  Clinical Narrative:  Patient medically stable for discharge back to [facility] to continue rehab. Clinicals faxed to [employer] for authorization. Checked [facility] Access Online at 5:20 pm and patient Josem Kaufmann is pending. Auth ID is [id number]. CSW will continue to follow and facilitate discharge back to [place], pending insurance authorization.        Barriers to Discharge: Continued Medical Work up  Expected Discharge Plan and [place] arrangements for the past 2 months: [place] Expected Discharge Date: 10/11/19                                   Social Determinants of Health (SDOH) Interventions  No SDOH interventions requested or needed at this time.  Readmission Risk Interventions Readmission Risk Prevention Plan 09/12/2019  Post Dischage Appt Complete  Medication Screening Complete  Transportation Screening Complete  Some recent data might be hidden

## 2019-10-11 NOTE — Care Management Important Message (Signed)
Important Message  Patient Details  Name: Lee House MRN: [id number] Date of Birth: 06/07/1965   Medicare Important Message Given:  Yes     Natallie Ravenscroft 10/11/2019, 1:12 PM

## 2019-10-12 NOTE — Progress Notes (Signed)
No changes, awaiting insurance authorization, see my discharge summary from yesterday  Domenic Polite, MD

## 2019-10-12 NOTE — Plan of Care (Signed)
°  Problem: Coping: °Goal: Level of anxiety will decrease °Outcome: Progressing °  °

## 2019-10-12 NOTE — TOC Progression Note (Signed)
Transition of Care [facility]) - Progression Note    Patient Details  Name: Lee House MRN: [id number] Date of Birth: 08-16-1965  Transition of Care [facility]) CM/SW [place], [place] Phone Number: 10/12/2019, 10:37 AM  Clinical Narrative:     CSW checked on [facility] authorization, currently still pending. CSW will follow-up.     Barriers to Discharge: Continued Medical Work up  Expected Discharge Plan and [place] arrangements for the past 2 months: [place] Expected Discharge Date: 10/11/19                                     Social Determinants of Health (SDOH) Interventions    Readmission Risk Interventions Readmission Risk Prevention Plan 09/12/2019  Post Dischage Appt Complete  Medication Screening Complete  Transportation Screening Complete  Some recent data might be hidden

## 2019-10-13 NOTE — Progress Notes (Signed)
No changes, still awaiting insurance authorization, remains stable for discharge, see my DC summary from 6/25  Domenic Polite, MD

## 2019-10-13 NOTE — TOC Progression Note (Addendum)
Transition of Care [facility]) - Progression Note    Patient Details  Name: Lee House MRN: [id number] Date of Birth: 03/01/1966  Transition of Care [facility]) CM/SW [place], [place] Phone Number: 10/13/2019, 9:36 AM  Clinical Narrative:     CSW followed up on [facility] authorization. Currently still pending. CSW contacted by [facility], informed if authorization is not back by 1p, patient will need to discharge Monday.   Barriers to Discharge: Continued Medical Work up  Expected Discharge Plan and [place] arrangements for the past 2 months: [place] Expected Discharge Date: 10/11/19                                     Social Determinants of Health (SDOH) Interventions    Readmission Risk Interventions Readmission Risk Prevention Plan 09/12/2019  Post Dischage Appt Complete  Medication Screening Complete  Transportation Screening Complete  Some recent data might be hidden

## 2019-10-13 NOTE — Plan of Care (Signed)
Awaiting insurance authorization.

## 2019-10-13 NOTE — Plan of Care (Signed)
°  Problem: Coping: °Goal: Level of anxiety will decrease °Outcome: Progressing °  °

## 2019-10-14 NOTE — Progress Notes (Signed)
Lee House to be discharged Skilled nursing facility per MD order. Discussed prescriptions and follow up appointments with the patient. Prescriptions given to patient; medication list explained in detail. Patient verbalized understanding.  Skin clean, dry and intact without evidence of skin break down, no evidence of skin tears noted. IV catheter discontinued intact. Site without signs and symptoms of complications. Dressing and pressure applied. Pt denies pain at the site currently. No complaints noted.  Patient free of lines, drains, and wounds.   An After Visit Summary (AVS) was printed and given to the patient. Patient escorted via stretcher, and discharged home via [place].  Lee House  Shaneal Barasch, RN

## 2019-10-14 NOTE — TOC Transition Note (Signed)
Transition of Care [facility]) - CM/SW Discharge Note   Patient Details  Name: Lee House MRN: [id number] Date of Birth: Nov 02, 1965  Transition of Care [facility]) CM/SW Contact:  Sable Feil, LCSW Phone Number: 10/14/2019, 5:12 PM   Clinical Narrative:   Patient medically stable for discharge and returning to [facility]. Facility advised of receipt of insurance authorization: Auth [id number]; Reference [id number]; approved for 3 days effective 6/28; Next review 6/30; Care coordination - Shawn Key and fax # for continued stay clinicals - [phone].  Patient's aunt Lee House contacted [phone]) and advised of discharge. Patient will be transported by non-emergency ambulance transport.   Final next level of care: [facility]) Barriers to Discharge: Barriers Resolved   Patient Goals and CMS Choice Patient states their goals for this hospitalization and ongoing recovery are:: Family in agreement with patient returning to [facility].gov Compare Post Acute Care list provided to:: Other (Comment Required) (Not needed as patient returning to [facility]) Choice offered to / list presented to : NA  Discharge Placement                Patient to be transferred to facility by: [place] Name of family member notified: Lee House - [phone] Patient and family notified of of transfer: 10/14/19  Discharge Plan and Services                                     Social Determinants of Health (SDOH) Interventions  No SDOH interventions needed at discharge   Readmission Risk Interventions Readmission Risk Prevention Plan 09/12/2019  Post Dischage Appt Complete  Medication Screening Complete  Transportation Screening Complete  Some recent data might be hidden

## 2019-10-14 NOTE — Progress Notes (Signed)
Report called and given to [facility].

## 2019-10-22 ENCOUNTER — Other Ambulatory Visit: Payer: Self-pay

## 2019-10-22 ENCOUNTER — Emergency Department (HOSPITAL_COMMUNITY)
Admission: EM | Admit: 2019-10-22 | Discharge: 2019-10-22 | Disposition: A | Discharge status: Home / Self Care | Payer: 59 | Attending: Emergency Medicine | Admitting: Emergency Medicine

## 2019-10-22 DIAGNOSIS — Z79899 Other long term (current) drug therapy: Secondary | ICD-10-CM | POA: Diagnosis not present

## 2019-10-22 DIAGNOSIS — J449 Chronic obstructive pulmonary disease, unspecified: Secondary | ICD-10-CM | POA: Insufficient documentation

## 2019-10-22 DIAGNOSIS — Z87891 Personal history of nicotine dependence: Secondary | ICD-10-CM | POA: Diagnosis not present

## 2019-10-22 DIAGNOSIS — T83021A Displacement of indwelling urethral catheter, initial encounter: Secondary | ICD-10-CM | POA: Diagnosis not present

## 2019-10-22 DIAGNOSIS — Y732 Prosthetic and other implants, materials and accessory gastroenterology and urology devices associated with adverse incidents: Secondary | ICD-10-CM | POA: Insufficient documentation

## 2019-10-22 DIAGNOSIS — Z7982 Long term (current) use of aspirin: Secondary | ICD-10-CM | POA: Diagnosis not present

## 2019-10-22 LAB — COMPREHENSIVE METABOLIC PANEL
ALT: 22 U/L (ref 0–44)
AST: 23 U/L (ref 15–41)
Albumin: 3.5 g/dL (ref 3.5–5.0)
Alkaline Phosphatase: 129 U/L — ABNORMAL HIGH (ref 38–126)
Anion gap: 10 (ref 5–15)
BUN: 9 mg/dL (ref 6–20)
CO2: 26 mmol/L (ref 22–32)
Calcium: 8.6 mg/dL — ABNORMAL LOW (ref 8.9–10.3)
Chloride: 96 mmol/L — ABNORMAL LOW (ref 98–111)
Creatinine, Ser: 0.86 mg/dL (ref 0.61–1.24)
GFR calc Af Amer: >60 mL/min (ref >60)
GFR calc non Af Amer: >60 mL/min (ref >60)
Glucose, Bld: 99 mg/dL (ref 70–99)
Potassium: 3.7 mmol/L (ref 3.5–5.1)
Sodium: 132 mmol/L — ABNORMAL LOW (ref 135–145)
Total Bilirubin: 0.3 mg/dL (ref 0.3–1.2)
Total Protein: 6.4 g/dL — ABNORMAL LOW (ref 6.5–8.1)

## 2019-10-22 LAB — URINALYSIS, ROUTINE W REFLEX MICROSCOPIC
Bilirubin Urine: NEGATIVE
Glucose, UA: NEGATIVE mg/dL
Ketones, ur: NEGATIVE mg/dL
Nitrite: POSITIVE — AB
Protein, ur: NEGATIVE mg/dL
Specific Gravity, Urine: 1.003 — ABNORMAL LOW (ref 1.005–1.030)
WBC, UA: >50 WBC/hpf — ABNORMAL HIGH (ref 0–5)
pH: 7 (ref 5.0–8.0)

## 2019-10-22 LAB — CBC WITH DIFFERENTIAL/PLATELET
Abs Immature Granulocytes: 0.07 10*3/uL (ref 0.00–0.07)
Basophils Absolute: 0 10*3/uL (ref 0.0–0.1)
Basophils Relative: 0 %
Eosinophils Absolute: 0.1 10*3/uL (ref 0.0–0.5)
Eosinophils Relative: 2 %
HCT: 38.7 % — ABNORMAL LOW (ref 39.0–52.0)
Hemoglobin: 12.4 g/dL — ABNORMAL LOW (ref 13.0–17.0)
Immature Granulocytes: 1 %
Lymphocytes Relative: 19 %
Lymphs Abs: 1.3 10*3/uL (ref 0.7–4.0)
MCH: 27.8 pg (ref 26.0–34.0)
MCHC: 32 g/dL (ref 30.0–36.0)
MCV: 86.8 fL (ref 80.0–100.0)
Monocytes Absolute: 0.7 10*3/uL (ref 0.1–1.0)
Monocytes Relative: 10 %
Neutro Abs: 4.6 10*3/uL (ref 1.7–7.7)
Neutrophils Relative %: 68 %
Platelets: 264 10*3/uL (ref 150–400)
RBC: 4.46 MIL/uL (ref 4.22–5.81)
RDW: 14.9 % (ref 11.5–15.5)
WBC: 6.8 10*3/uL (ref 4.0–10.5)
nRBC: 0 % (ref 0.0–0.2)

## 2019-10-22 NOTE — ED Notes (Signed)
Richarda Blade aunt [phone] would like to speak with a nurse about this patient

## 2019-10-22 NOTE — ED Provider Notes (Signed)
Blood pressure 110/72, pulse 78, SpO2 100 %.  Assuming care from Dr. Alvino Chapel.  In short, Lee House is a 54 y.o. male with a chief complaint of pulled out urinary catheter .  Refer to the original H&P for additional details.  The current plan of care is to f/u on labs. Patient agreeable to replace foley here. Able to void with 600 ml in the bladder but 250-300 ml post-void residual. Patient with history of recent sepsis 2/2 UTI after refusing foley cath in the past. He agreed to have this replaced here. Will get labs and reassess. Spoke with Pharmacy after review of UA and cultures. Question colonization ultimately. Patient had 10 day course of Vancomycin and Cefepime in the last 2 weeks. Could consider Zosyn if admitted or fosfomycin if discharged home vs sending urine culture and waiting for results there.   04:44 PM  Patient's lab work reviewed.  No leukocytosis.  Vitals have remained within normal limits here without fever, tachycardia, other SIRS vitals.  Patient was agreeable to having the Foley catheter replaced.  No particular symptoms to suspect urinary tract infection.  Favor colonization leading to UA findings.  Patient is going back to a setting with frequent reevaluation and will return with any new or worsening symptoms.  Will hold on antibiotics for now but have sent the urine for culture. Will place Urology referral in Epic to facilitate f/u.     Margette Fast, MD 10/22/19 [phone]

## 2019-10-22 NOTE — ED Notes (Signed)
[facility] called to transport pt 

## 2019-10-22 NOTE — ED Notes (Signed)
Patient voided in urinal 663ml empty, bladder scanned 231ml

## 2019-10-22 NOTE — Discharge Instructions (Signed)
You were seen in the emergency department today after pulling out her Foley catheter.  It is important she keep this in place until the urologist removes it.  I have placed a referral in our system to help with this appointment and have listed their contact information on this discharge paperwork.  We are sending your urine for culture to see if bacteria grow that would need antibiotics.  Your lab work and vital signs here did not show sign of infection.  If you develop confusion, fever, pain with urination you should return to the emergency department immediately for reevaluation and to possibly start antibiotics but none are required at this time.

## 2019-10-22 NOTE — ED Triage Notes (Signed)
Patient arrived via [facility] from [place] facility stated that patient pulled his foley out Sunday and they attempted to put it back in and unsuccessful. Patient has a history of urinary retention and needs foley. Patient has a history of schizophrenia and per EMS patient has been very agitated the entire ride and feels like the hospital is jail.

## 2019-10-22 NOTE — ED Notes (Signed)
Bladder scan done 361ml

## 2019-10-22 NOTE — ED Notes (Signed)
Patient voided 34ml of urine when bladder scanned 651mL. Patient drank 3 Sprite prior to voiding.

## 2019-10-22 NOTE — ED Notes (Signed)
Help get patient vitals got patient a warm blanket patient is resting with call bell in reach

## 2019-10-22 NOTE — ED Provider Notes (Signed)
[facility] EMERGENCY DEPARTMENT Provider Note   CSN: [id number] Arrival date & time: 10/22/19  1255     History Chief Complaint  Patient presents with  . pulled out urinary catheter    Lee House is a 54 y.o. male. Level 5 caveat due to psychiatric disorder. HPI Patient sent in from nursing home after he pulled out his Foley catheter.  Reportedly has been out since Sunday.  Has psychiatric issues and has had recent septic shock due to UTI.  Patient states he does not want his catheter in because he can urinate fine without it.  Does have a chronic neurogenic bladder.    Past Medical History:  Diagnosis Date  . Asthma   . Bipolar 1 disorder ([place])   . COPD (chronic obstructive pulmonary disease) ([place])   . GERD (gastroesophageal reflux disease)   . Schizo affective schizophrenia [facility])     Patient Active Problem List   Diagnosis Date Noted  . Sepsis ([place]) 09/30/2019  . Catheter-associated urinary tract infection ([place]) 09/29/2019  . Neurogenic bladder 09/29/2019  . AMS (altered mental status) 09/09/2019  . Lower urinary tract infectious disease 09/09/2019  . Schizophrenia ([place]) 09/09/2019  . COPD with acute bronchitis ([place]) 09/09/2019  . Distal radial fracture 02/27/2012  . Anemia 06/02/2011  . Cellulitis and abscess of leg 06/01/2011  . Altered mental status 06/01/2011  . Thrombocytopenia ([place]) 06/01/2011    Past Surgical History:  Procedure Laterality Date  . CARPAL TUNNEL RELEASE  02/27/2012   Procedure: CARPAL TUNNEL RELEASE;  Surgeon: Schuyler Amor, MD;  Location: [facility];  Service: Orthopedics;  Laterality: Left;  . OPEN REDUCTION INTERNAL FIXATION (ORIF) DISTAL RADIAL FRACTURE  02/27/2012   Procedure: OPEN REDUCTION INTERNAL FIXATION (ORIF) DISTAL RADIAL FRACTURE;  Surgeon: Schuyler Amor, MD;  Location: [facility];  Service: Orthopedics;  Laterality: Left;  Open reduction internal fixation of  left distal radius fracture.         No family history on file.  Social History   Tobacco Use  . Smoking status: Former Research scientist (life sciences)  . Smokeless tobacco: Never Used  Substance Use Topics  . Alcohol use: No  . Drug use: No    Home Medications Prior to Admission medications   Medication Sig Start Date End Date Taking? Authorizing Provider  acetaminophen (TYLENOL) 325 MG tablet Take 2 tablets (650 mg total) by mouth every 6 (six) hours as needed for mild pain (or Fever >/= 101). 10/11/19   Domenic Polite, MD  albuterol (PROVENTIL HFA;VENTOLIN HFA) 108 (90 BASE) MCG/ACT inhaler Inhale 2 puffs into the lungs every 4 (four) hours as needed for shortness of breath.     [provider]  ALPRAZolam Duanne Moron) 0.25 MG tablet Take 1 tablet (0.25 mg total) by mouth 2 (two) times daily as needed for anxiety. 10/11/19   Domenic Polite, MD  ARIPiprazole (ABILIFY) 10 MG tablet Take 10 mg by mouth in the morning and at bedtime.     [provider]  aspirin EC 81 MG tablet Take 81 mg by mouth in the morning and at bedtime. Swallow whole.    [provider]  benztropine (COGENTIN) 0.5 MG tablet Take 0.5 mg by mouth 2 (two) times daily.    [provider]  Calcium Carb-Cholecalciferol (CALCIUM 500 +D PO) Take 1 tablet by mouth daily.    [provider]  divalproex (DEPAKOTE) 125 MG DR tablet Take 125 mg by mouth 3 (three) times daily.    [provider]  ferrous sulfate (FEROSUL) 325 (65 FE) MG tablet Take 325 mg by mouth 2 (two) times daily with a meal.    [provider]  Fluticasone-Salmeterol (ADVAIR) 250-50 MCG/DOSE AEPB Inhale 1 puff into the lungs every 12 (twelve) hours.    [provider]  meloxicam (MOBIC) 7.5 MG tablet Take 7.5 mg by mouth daily.    [provider]  Multiple Vitamins-Minerals (CEROVITE SENIOR) TABS Take 1 tablet by mouth daily.    [provider]  OLANZapine (ZYPREXA) 10 MG tablet Take 10 mg by mouth 2 (two) times daily.      [provider]  oxyCODONE (OXY IR/ROXICODONE) 5 MG immediate release tablet Take 1 tablet (5 mg total) by mouth every 6 (six) hours as needed for moderate pain or severe pain. 10/11/19   Domenic Polite, MD  pantoprazole (PROTONIX) 40 MG tablet Take 40 mg by mouth daily.    [provider]  risperidone (RISPERDAL) 4 MG tablet Take 0.5-1 tablets (2-4 mg total) by mouth 2 (two) times daily. Take 2mg  in am and 4mg  in pm 10/11/19   Domenic Polite, MD  senna-docusate (SENOKOT-S) 8.6-50 MG tablet Take 1 tablet by mouth 2 (two) times daily as needed for constipation. 08/23/19   [provider]  tamsulosin (FLOMAX) 0.4 MG CAPS capsule Take 0.4 mg by mouth daily.    [provider]  traZODone (DESYREL) 50 MG tablet Take 50 mg by mouth at bedtime as needed for sleep.     [provider]    Allergies    Penicillins  Review of Systems   Review of Systems  Unable to perform ROS: Psychiatric disorder    Physical Exam Updated Vital Signs BP 110/72   Pulse 78   SpO2 100%   Physical Exam Vitals reviewed.  HENT:     Head: Atraumatic.     Mouth/Throat:     Mouth: Mucous membranes are moist.  Cardiovascular:     Rate and Rhythm: Regular rhythm.  Pulmonary:     Breath sounds: No wheezing or rhonchi.  Abdominal:     Tenderness: There is no abdominal tenderness.  Skin:    Capillary Refill: Capillary refill takes less than 2 seconds.  Neurological:     Mental Status: He is alert.     Comments: Awake and answers questions with somewhat uncooperative.     ED Results / Procedures / Treatments   Labs (all labs ordered are listed, but only abnormal results are displayed) Labs Reviewed  URINALYSIS, ROUTINE W REFLEX MICROSCOPIC - Abnormal; Notable for the following components:      Result Value   APPearance HAZY (*)    Specific Gravity, Urine 1.003 (*)    Hgb urine dipstick MODERATE (*)    Nitrite POSITIVE (*)    Leukocytes,Ua LARGE (*)    WBC, UA >50  (*)    Bacteria, UA RARE (*)    All other components within normal limits  URINE CULTURE  CULTURE, BLOOD (ROUTINE X 2)  CULTURE, BLOOD (ROUTINE X 2)  COMPREHENSIVE METABOLIC PANEL  CBC WITH DIFFERENTIAL/PLATELET    EKG None  Radiology No results found.  Procedures Procedures (including critical care time)  Medications Ordered in ED Medications - No data to display  ED Course  I have reviewed the triage vital signs and the nursing notes.  Pertinent labs & imaging results that were available during my care of the patient were reviewed by me and considered in my medical decision making (  see chart for details).    MDM Rules/Calculators/A&P                          Patient presented with Foley catheter that he had pulled out.  Does not want it replaced.  However has had recent sepsis with UTI.  Urine shows possible infection.  Patient does not want to get blood to be admitted.  Care turned over to Dr. Laverta Baltimore. Final Clinical Impression(s) / ED Diagnoses Final diagnoses:  Urinary tract infection without hematuria, site unspecified    Rx / DC Orders ED Discharge Orders    None       Davonna Belling, MD 10/22/19 1515

## 2019-10-24 LAB — URINE CULTURE: Culture: >=100000 — AB

## 2019-10-25 ENCOUNTER — Telehealth: Payer: Self-pay | Admitting: Emergency Medicine

## 2019-10-25 NOTE — Telephone Encounter (Signed)
Post ED Visit - Positive Culture Follow-up: Successful Patient Follow-Up  Culture assessed and recommendations reviewed by:  []  Elenor Quinones, Pharm.D. []  Heide Guile, Pharm.D., BCPS AQ-ID []  Parks Neptune, Pharm.D., BCPS []  Alycia Rossetti, Pharm.D., BCPS []  [place], Pharm.D., BCPS, AAHIVP []  Legrand Como, Pharm.D., BCPS, AAHIVP [x]  Salome Arnt, PharmD, BCPS []  Johnnette Gourd, PharmD, BCPS []  Hughes Better, PharmD, BCPS []  Leeroy Cha, PharmD  Positive urine culture  [x]  Patient discharged without antimicrobial prescription and treatment is now indicated []  Organism is resistant to prescribed ED discharge antimicrobial []  Patient with positive blood cultures  Changes discussed with ED provider: Carmon Sails, PA New antibiotic prescription: Cipro 500 mg Po BID x five days Called/faxed to [facility]  Contacted [facility] where patient is a resident, date 10/25/2019, time [place] 10/25/2019, 11:17 AM

## 2019-10-25 NOTE — Progress Notes (Signed)
ED Antimicrobial Stewardship Positive Culture Follow Up   Lee House is an 54 y.o. male who presented to [facility] on 10/22/2019 with a chief complaint of  Chief Complaint  Patient presents with  . pulled out urinary catheter    Recent Results (from the past 720 hour(s))  Culture, blood (single) w Reflex to ID Panel     Status: None   Collection Time: 09/29/19  6:06 PM   Specimen: BLOOD  Result Value Ref Range Status   Specimen Description BLOOD LEFT ANTECUBITAL  Final   Special Requests   Final    BOTTLES DRAWN AEROBIC AND ANAEROBIC Blood Culture adequate volume   Culture   Final    NO GROWTH 5 DAYS Performed at [facility], [address] [place], [place] [address]    Report Status 10/04/2019 FINAL  Final  SARS Coronavirus 2 by RT PCR (hospital order, performed in [facility] hospital lab) Nasopharyngeal Nasopharyngeal Swab     Status: None   Collection Time: 09/29/19  8:52 PM   Specimen: Nasopharyngeal Swab  Result Value Ref Range Status   SARS Coronavirus 2 NEGATIVE NEGATIVE Final    Comment: (NOTE) SARS-CoV-2 target nucleic acids are NOT DETECTED.  The SARS-CoV-2 RNA is generally detectable in upper and lower respiratory specimens during the acute phase of infection. The lowest concentration of SARS-CoV-2 viral copies this assay can detect is 250 copies / mL. A negative result does not preclude SARS-CoV-2 infection and should not be used as the sole basis for treatment or other patient management decisions.  A negative result may occur with improper specimen collection / handling, submission of specimen other than nasopharyngeal swab, presence of viral mutation(s) within the areas targeted by this assay, and inadequate number of viral copies (<250 copies / mL). A negative result must be combined with clinical observations, patient history, and epidemiological information.  Fact Sheet for Patients:   StrictlyIdeas.no  Fact  Sheet for Healthcare Providers: BankingDealers.co.za  This test is not yet approved or  cleared by the [place] FDA and has been authorized for detection and/or diagnosis of SARS-CoV-2 by FDA under an Emergency Use Authorization (EUA).  This EUA will remain in effect (meaning this test can be used) for the duration of the COVID-19 declaration under Section 564(b)(1) of the Act, 21 U.S.C. section 360bbb-3(b)(1), unless the authorization is terminated or revoked sooner.  Performed at [facility], [place] [address] [place], [place] [address]   Culture, Urine     Status: Abnormal   Collection Time: 09/30/19  3:43 AM   Specimen: Urine, Catheterized  Result Value Ref Range Status   Specimen Description URINE, CATHETERIZED  Final   Special Requests   Final    NONE Performed at [facility], [place] [address] [place],  [address]    Culture (A)  Final    >=100,000 COLONIES/mL PSEUDOMONAS AERUGINOSA >=100,000 COLONIES/mL ENTEROCOCCUS FAECALIS    Report Status 10/02/2019 FINAL  Final   Organism ID, Bacteria PSEUDOMONAS AERUGINOSA (A)  Final   Organism ID, Bacteria ENTEROCOCCUS FAECALIS (A)  Final      Susceptibility   Enterococcus faecalis - MIC*    AMPICILLIN <=2 SENSITIVE Sensitive     NITROFURANTOIN <=16 SENSITIVE Sensitive     VANCOMYCIN 1 SENSITIVE Sensitive     * >=100,000 COLONIES/mL ENTEROCOCCUS FAECALIS   Pseudomonas aeruginosa - MIC*    CEFTAZIDIME <=1 SENSITIVE Sensitive     CIPROFLOXACIN <=0.25 SENSITIVE Sensitive  GENTAMICIN <=1 SENSITIVE Sensitive     IMIPENEM 2 SENSITIVE Sensitive     PIP/TAZO <=4 SENSITIVE Sensitive     CEFEPIME <=1 SENSITIVE Sensitive     * >=100,000 COLONIES/mL PSEUDOMONAS AERUGINOSA  Culture, blood (routine x 2)     Status: None   Collection Time: 09/30/19  8:12 AM   Specimen: BLOOD RIGHT ARM  Result Value Ref Range Status   Specimen Description BLOOD RIGHT ARM  Final   Special Requests   Final     BOTTLES DRAWN AEROBIC ONLY Blood Culture adequate volume   Culture   Final    NO GROWTH 5 DAYS Performed at [facility], [place] [address], [place], [place] [address]    Report Status 10/05/2019 FINAL  Final  Culture, blood (routine x 2)     Status: None   Collection Time: 09/30/19  8:13 AM   Specimen: BLOOD RIGHT HAND  Result Value Ref Range Status   Specimen Description BLOOD RIGHT HAND  Final   Special Requests   Final    BOTTLES DRAWN AEROBIC ONLY Blood Culture adequate volume   Culture   Final    NO GROWTH 5 DAYS Performed at [facility], [place] [address], [place], [place] [address]    Report Status 10/05/2019 FINAL  Final  MRSA PCR Screening     Status: None   Collection Time: 09/30/19  8:50 AM   Specimen: Nasopharyngeal  Result Value Ref Range Status   MRSA by PCR NEGATIVE NEGATIVE Final    Comment:        The GeneXpert MRSA Assay (FDA approved for NASAL specimens only), is one component of a comprehensive MRSA colonization surveillance program. It is not intended to diagnose MRSA infection nor to guide or monitor treatment for MRSA infections. Performed at [facility], [place] [address], [place], [place] [address]   SARS CORONAVIRUS 2 (TAT 6-24 HRS) Nasopharyngeal Nasopharyngeal Swab     Status: None   Collection Time: 10/11/19 10:06 AM   Specimen: Nasopharyngeal Swab  Result Value Ref Range Status   SARS Coronavirus 2 NEGATIVE NEGATIVE Final    Comment: (NOTE) SARS-CoV-2 target nucleic acids are NOT DETECTED.  The SARS-CoV-2 RNA is generally detectable in upper and lower respiratory specimens during the acute phase of infection. Negative results do not preclude SARS-CoV-2 infection, do not rule out co-infections with other pathogens, and should not be used as the sole basis for treatment or other patient management decisions. Negative results must be combined with clinical observations, patient history, and epidemiological information. The  expected result is Negative.  Fact Sheet for Patients: SugarRoll.be  Fact Sheet for Healthcare Providers: https://www.woods-mathews.com/  This test is not yet approved or cleared by the [place] FDA and  has been authorized for detection and/or diagnosis of SARS-CoV-2 by FDA under an Emergency Use Authorization (EUA). This EUA will remain  in effect (meaning this test can be used) for the duration of the COVID-19 declaration under Se ction 564(b)(1) of the Act, 21 U.S.C. section 360bbb-3(b)(1), unless the authorization is terminated or revoked sooner.  Performed at [facility], [place] [address], [place],  [address]   Urine culture     Status: Abnormal   Collection Time: 10/22/19  1:36 PM   Specimen: Urine, Random  Result Value Ref Range Status   Specimen Description URINE, RANDOM  Final   Special Requests   Final    NONE Performed at [facility],  [address], [place], [place] [address]    Culture >=100,000 COLONIES/mL PSEUDOMONAS AERUGINOSA (A)  Final   Report Status 10/24/2019 FINAL  Final   Organism ID, Bacteria PSEUDOMONAS AERUGINOSA (A)  Final      Susceptibility   Pseudomonas aeruginosa - MIC*    CEFTAZIDIME <=1 SENSITIVE Sensitive     CIPROFLOXACIN <=0.25 SENSITIVE Sensitive     GENTAMICIN <=1 SENSITIVE Sensitive     IMIPENEM 1 SENSITIVE Sensitive     PIP/TAZO <=4 SENSITIVE Sensitive     CEFEPIME 2 SENSITIVE Sensitive     * >=100,000 COLONIES/mL PSEUDOMONAS AERUGINOSA  Culture, blood (routine x 2)     Status: None (Preliminary result)   Collection Time: 10/22/19  3:35 PM   Specimen: BLOOD  Result Value Ref Range Status   Specimen Description BLOOD RIGHT ANTECUBITAL  Final   Special Requests   Final    BOTTLES DRAWN AEROBIC AND ANAEROBIC Blood Culture adequate volume   Culture   Final    NO GROWTH 2 DAYS Performed at [facility], [address] [place], [place] [address]    Report  Status PENDING  Incomplete  Culture, blood (routine x 2)     Status: None (Preliminary result)   Collection Time: 10/22/19  3:40 PM   Specimen: BLOOD  Result Value Ref Range Status   Specimen Description BLOOD LEFT ANTECUBITAL  Final   Special Requests   Final    BOTTLES DRAWN AEROBIC AND ANAEROBIC Blood Culture adequate volume   Culture   Final    NO GROWTH 2 DAYS Performed at [facility], [place] [address] [place], [place] [address]    Report Status PENDING  Incomplete    []  Treated with N/A, organism resistant to prescribed antimicrobial [x]  Patient discharged originally without antimicrobial agent and treatment is now indicated  New antibiotic prescription: ciprofloxacin 500mg  PO BID x 5 days  ED Provider: Carmon Sails, PA-C   [place], Rande Lawman 10/25/2019, 9:17 AM Clinical Pharmacist Monday - Friday phone -  [phone] Saturday - Sunday phone - [phone]

## 2019-10-27 LAB — CULTURE, BLOOD (ROUTINE X 2)
Culture: NO GROWTH
Culture: NO GROWTH
Special Requests: ADEQUATE
Special Requests: ADEQUATE

## 2020-01-04 ENCOUNTER — Inpatient Hospital Stay (HOSPITAL_COMMUNITY): Payer: 59

## 2020-01-04 ENCOUNTER — Inpatient Hospital Stay (HOSPITAL_COMMUNITY)
Admission: EM | Admit: 2020-01-04 | Discharge: 2020-01-10 | DRG: 698 | Disposition: A | Discharge status: Skilled Nursing Facility | Payer: 59 | Source: Skilled Nursing Facility | Attending: Internal Medicine | Admitting: Internal Medicine

## 2020-01-04 ENCOUNTER — Other Ambulatory Visit: Payer: Self-pay

## 2020-01-04 ENCOUNTER — Emergency Department (HOSPITAL_COMMUNITY): Payer: 59

## 2020-01-04 DIAGNOSIS — E876 Hypokalemia: Secondary | ICD-10-CM | POA: Diagnosis not present

## 2020-01-04 DIAGNOSIS — R6520 Severe sepsis without septic shock: Secondary | ICD-10-CM | POA: Diagnosis present

## 2020-01-04 DIAGNOSIS — F3130 Bipolar disorder, current episode depressed, mild or moderate severity, unspecified: Secondary | ICD-10-CM | POA: Diagnosis present

## 2020-01-04 DIAGNOSIS — Z978 Presence of other specified devices: Secondary | ICD-10-CM

## 2020-01-04 DIAGNOSIS — D6489 Other specified anemias: Secondary | ICD-10-CM | POA: Diagnosis not present

## 2020-01-04 DIAGNOSIS — F259 Schizoaffective disorder, unspecified: Secondary | ICD-10-CM | POA: Diagnosis present

## 2020-01-04 DIAGNOSIS — Z79899 Other long term (current) drug therapy: Secondary | ICD-10-CM | POA: Diagnosis not present

## 2020-01-04 DIAGNOSIS — R112 Nausea with vomiting, unspecified: Secondary | ICD-10-CM

## 2020-01-04 DIAGNOSIS — Z87440 Personal history of urinary (tract) infections: Secondary | ICD-10-CM

## 2020-01-04 DIAGNOSIS — E860 Dehydration: Secondary | ICD-10-CM | POA: Diagnosis present

## 2020-01-04 DIAGNOSIS — N390 Urinary tract infection, site not specified: Secondary | ICD-10-CM | POA: Diagnosis not present

## 2020-01-04 DIAGNOSIS — K219 Gastro-esophageal reflux disease without esophagitis: Secondary | ICD-10-CM | POA: Diagnosis present

## 2020-01-04 DIAGNOSIS — T83091A Other mechanical complication of indwelling urethral catheter, initial encounter: Principal | ICD-10-CM | POA: Diagnosis present

## 2020-01-04 DIAGNOSIS — J449 Chronic obstructive pulmonary disease, unspecified: Secondary | ICD-10-CM | POA: Diagnosis present

## 2020-01-04 DIAGNOSIS — D6959 Other secondary thrombocytopenia: Secondary | ICD-10-CM | POA: Diagnosis present

## 2020-01-04 DIAGNOSIS — Z87891 Personal history of nicotine dependence: Secondary | ICD-10-CM | POA: Diagnosis not present

## 2020-01-04 DIAGNOSIS — K567 Ileus, unspecified: Secondary | ICD-10-CM | POA: Diagnosis not present

## 2020-01-04 DIAGNOSIS — N319 Neuromuscular dysfunction of bladder, unspecified: Secondary | ICD-10-CM | POA: Diagnosis present

## 2020-01-04 DIAGNOSIS — N179 Acute kidney failure, unspecified: Secondary | ICD-10-CM | POA: Diagnosis present

## 2020-01-04 DIAGNOSIS — Z4659 Encounter for fitting and adjustment of other gastrointestinal appliance and device: Secondary | ICD-10-CM

## 2020-01-04 DIAGNOSIS — G9341 Metabolic encephalopathy: Secondary | ICD-10-CM | POA: Diagnosis present

## 2020-01-04 DIAGNOSIS — R7881 Bacteremia: Secondary | ICD-10-CM | POA: Diagnosis not present

## 2020-01-04 DIAGNOSIS — Z881 Allergy status to other antibiotic agents status: Secondary | ICD-10-CM

## 2020-01-04 DIAGNOSIS — Z7982 Long term (current) use of aspirin: Secondary | ICD-10-CM | POA: Diagnosis not present

## 2020-01-04 DIAGNOSIS — Z880 Allergy status to penicillin: Secondary | ICD-10-CM | POA: Diagnosis not present

## 2020-01-04 DIAGNOSIS — Z20822 Contact with and (suspected) exposure to covid-19: Secondary | ICD-10-CM | POA: Diagnosis present

## 2020-01-04 DIAGNOSIS — N139 Obstructive and reflux uropathy, unspecified: Secondary | ICD-10-CM

## 2020-01-04 DIAGNOSIS — A4159 Other Gram-negative sepsis: Secondary | ICD-10-CM | POA: Diagnosis present

## 2020-01-04 DIAGNOSIS — Z791 Long term (current) use of non-steroidal anti-inflammatories (NSAID): Secondary | ICD-10-CM

## 2020-01-04 DIAGNOSIS — Z0189 Encounter for other specified special examinations: Secondary | ICD-10-CM

## 2020-01-04 DIAGNOSIS — A419 Sepsis, unspecified organism: Secondary | ICD-10-CM | POA: Diagnosis present

## 2020-01-04 DIAGNOSIS — N3001 Acute cystitis with hematuria: Secondary | ICD-10-CM | POA: Diagnosis present

## 2020-01-04 DIAGNOSIS — F209 Schizophrenia, unspecified: Secondary | ICD-10-CM | POA: Diagnosis not present

## 2020-01-04 DIAGNOSIS — R0902 Hypoxemia: Secondary | ICD-10-CM | POA: Diagnosis present

## 2020-01-04 LAB — COMPREHENSIVE METABOLIC PANEL WITH GFR
ALT: 13 U/L (ref 0–44)
AST: 29 U/L (ref 15–41)
Albumin: 3.3 g/dL — ABNORMAL LOW (ref 3.5–5.0)
Alkaline Phosphatase: 74 U/L (ref 38–126)
Anion gap: 13 (ref 5–15)
BUN: 68 mg/dL — ABNORMAL HIGH (ref 6–20)
CO2: 15 mmol/L — ABNORMAL LOW (ref 22–32)
Calcium: 8.2 mg/dL — ABNORMAL LOW (ref 8.9–10.3)
Chloride: 101 mmol/L (ref 98–111)
Creatinine, Ser: 3.53 mg/dL — ABNORMAL HIGH (ref 0.61–1.24)
GFR calc Af Amer: 21 mL/min — ABNORMAL LOW (ref >60)
GFR calc non Af Amer: 18 mL/min — ABNORMAL LOW (ref >60)
Glucose, Bld: 121 mg/dL — ABNORMAL HIGH (ref 70–99)
Potassium: 4.6 mmol/L (ref 3.5–5.1)
Sodium: 129 mmol/L — ABNORMAL LOW (ref 135–145)
Total Bilirubin: 0.7 mg/dL (ref 0.3–1.2)
Total Protein: 6.7 g/dL (ref 6.5–8.1)

## 2020-01-04 LAB — BLOOD CULTURE ID PANEL (REFLEXED) - BCID2

## 2020-01-04 LAB — CBC WITH DIFFERENTIAL/PLATELET
Abs Immature Granulocytes: 0.25 K/uL — ABNORMAL HIGH (ref 0.00–0.07)
Basophils Absolute: 0 K/uL (ref 0.0–0.1)
Basophils Relative: 0 %
Eosinophils Absolute: 0 K/uL (ref 0.0–0.5)
Eosinophils Relative: 0 %
HCT: 44.6 % (ref 39.0–52.0)
Hemoglobin: 15 g/dL (ref 13.0–17.0)
Immature Granulocytes: 2 %
Lymphocytes Relative: 4 %
Lymphs Abs: 0.5 K/uL — ABNORMAL LOW (ref 0.7–4.0)
MCH: 27.5 pg (ref 26.0–34.0)
MCHC: 33.6 g/dL (ref 30.0–36.0)
MCV: 81.8 fL (ref 80.0–100.0)
Monocytes Absolute: 0.9 K/uL (ref 0.1–1.0)
Monocytes Relative: 7 %
Neutro Abs: 12.1 K/uL — ABNORMAL HIGH (ref 1.7–7.7)
Neutrophils Relative %: 87 %
Platelets: 146 K/uL — ABNORMAL LOW (ref 150–400)
RBC: 5.45 MIL/uL (ref 4.22–5.81)
RDW: 16.1 % — ABNORMAL HIGH (ref 11.5–15.5)
WBC: 13.8 K/uL — ABNORMAL HIGH (ref 4.0–10.5)
nRBC: 0 % (ref 0.0–0.2)

## 2020-01-04 LAB — URINALYSIS, ROUTINE W REFLEX MICROSCOPIC: RBC / HPF: >50 RBC/hpf — ABNORMAL HIGH (ref 0–5)

## 2020-01-04 LAB — BASIC METABOLIC PANEL
Anion gap: 12 (ref 5–15)
BUN: 53 mg/dL — ABNORMAL HIGH (ref 6–20)
CO2: 16 mmol/L — ABNORMAL LOW (ref 22–32)
Calcium: 8.2 mg/dL — ABNORMAL LOW (ref 8.9–10.3)
Chloride: 104 mmol/L (ref 98–111)
Creatinine, Ser: 2.26 mg/dL — ABNORMAL HIGH (ref 0.61–1.24)
GFR calc Af Amer: 36 mL/min — ABNORMAL LOW (ref >60)
GFR calc non Af Amer: 31 mL/min — ABNORMAL LOW (ref >60)
Glucose, Bld: 113 mg/dL — ABNORMAL HIGH (ref 70–99)
Potassium: 4.7 mmol/L (ref 3.5–5.1)
Sodium: 132 mmol/L — ABNORMAL LOW (ref 135–145)

## 2020-01-04 LAB — PROTIME-INR
INR: 1.6 — ABNORMAL HIGH (ref 0.8–1.2)
Prothrombin Time: 18.2 s — ABNORMAL HIGH (ref 11.4–15.2)

## 2020-01-04 LAB — SARS CORONAVIRUS 2 BY RT PCR (HOSPITAL ORDER, PERFORMED IN ~~LOC~~ HOSPITAL LAB): SARS Coronavirus 2: NEGATIVE

## 2020-01-04 LAB — VALPROIC ACID LEVEL: Valproic Acid Lvl: 32 ug/mL — ABNORMAL LOW (ref 50.0–100.0)

## 2020-01-04 LAB — LACTIC ACID, PLASMA
Lactic Acid, Venous: 3.6 mmol/L (ref 0.5–1.9)
Lactic Acid, Venous: 3.3 mmol/L (ref 0.5–1.9)
Lactic Acid, Venous: 2.7 mmol/L (ref 0.5–1.9)
Lactic Acid, Venous: 3.7 mmol/L (ref 0.5–1.9)
Lactic Acid, Venous: 3.4 mmol/L (ref 0.5–1.9)

## 2020-01-04 LAB — MRSA PCR SCREENING: MRSA by PCR: POSITIVE — AB

## 2020-01-04 MED ORDER — OLANZAPINE 10 MG PO TABS
10.0000 mg | ORAL_TABLET | Freq: Two times a day (BID) | ORAL | Status: DC
  Administered 2020-01-04: 10 mg via ORAL
  Filled 2020-01-04 (×2): qty 1

## 2020-01-04 MED ORDER — LACTATED RINGERS IV BOLUS
500.0000 mL | Freq: Once | INTRAVENOUS | Status: AC
  Administered 2020-01-04: 500 mL via INTRAVENOUS

## 2020-01-04 MED ORDER — POLYETHYLENE GLYCOL 3350 17 G PO PACK: 17.0000 g | PACK | Freq: Every day | ORAL | Status: DC | PRN

## 2020-01-04 MED ORDER — LACTATED RINGERS IV BOLUS
1000.0000 mL | Freq: Once | INTRAVENOUS | Status: AC
  Administered 2020-01-04: 1000 mL via INTRAVENOUS

## 2020-01-04 MED ORDER — OLANZAPINE 10 MG PO TABS
10.0000 mg | ORAL_TABLET | Freq: Two times a day (BID) | ORAL | Status: DC
  Administered 2020-01-05 – 2020-01-08 (×5): 10 mg
  Filled 2020-01-04 (×7): qty 1

## 2020-01-04 MED ORDER — MUPIROCIN 2 % EX OINT
1.0000 "application " | TOPICAL_OINTMENT | Freq: Two times a day (BID) | CUTANEOUS | Status: DC
  Administered 2020-01-04 – 2020-01-09 (×9): 1 via NASAL
  Filled 2020-01-04 (×2): qty 22

## 2020-01-04 MED ORDER — PANTOPRAZOLE SODIUM 40 MG PO TBEC: 40.0000 mg | DELAYED_RELEASE_TABLET | ORAL | Status: DC

## 2020-01-04 MED ORDER — LACTATED RINGERS IV BOLUS (SEPSIS)
1000.0000 mL | Freq: Once | INTRAVENOUS | Status: AC
  Administered 2020-01-04: 1000 mL via INTRAVENOUS

## 2020-01-04 MED ORDER — ORAL CARE MOUTH RINSE
15.0000 mL | Freq: Two times a day (BID) | OROMUCOSAL | Status: DC
  Administered 2020-01-04 – 2020-01-05 (×2): 15 mL via OROMUCOSAL

## 2020-01-04 MED ORDER — ARIPIPRAZOLE 10 MG PO TABS: 10.0000 mg | ORAL_TABLET | Freq: Two times a day (BID) | ORAL | Status: DC

## 2020-01-04 MED ORDER — LACTATED RINGERS IV SOLN: INTRAVENOUS | Status: DC

## 2020-01-04 MED ORDER — ACETAMINOPHEN 650 MG RE SUPP
650.0000 mg | Freq: Once | RECTAL | Status: AC
  Administered 2020-01-04: 650 mg via RECTAL
  Filled 2020-01-04: qty 1

## 2020-01-04 MED ORDER — VANCOMYCIN HCL 1500 MG/300ML IV SOLN
1500.0000 mg | Freq: Once | INTRAVENOUS | Status: AC
  Administered 2020-01-04: 1500 mg via INTRAVENOUS
  Filled 2020-01-04: qty 300

## 2020-01-04 MED ORDER — PANTOPRAZOLE SODIUM 40 MG PO PACK: 40.0000 mg | PACK | Freq: Every day | ORAL | Status: DC

## 2020-01-04 MED ORDER — SODIUM CHLORIDE 0.9 % IV SOLN
2.0000 g | Freq: Two times a day (BID) | INTRAVENOUS | Status: DC
  Administered 2020-01-04 – 2020-01-05 (×2): 2 g via INTRAVENOUS
  Filled 2020-01-04 (×2): qty 2

## 2020-01-04 MED ORDER — HEPARIN SODIUM (PORCINE) 5000 UNIT/ML IJ SOLN
5000.0000 [IU] | Freq: Three times a day (TID) | INTRAMUSCULAR | Status: DC
  Administered 2020-01-04 – 2020-01-10 (×19): 5000 [IU] via SUBCUTANEOUS
  Filled 2020-01-04 (×17): qty 1

## 2020-01-04 MED ORDER — ONDANSETRON HCL 4 MG/2ML IJ SOLN
4.0000 mg | Freq: Three times a day (TID) | INTRAMUSCULAR | Status: DC | PRN
  Administered 2020-01-04: 4 mg via INTRAVENOUS
  Filled 2020-01-04: qty 2

## 2020-01-04 MED ORDER — DIVALPROEX SODIUM 250 MG PO DR TAB
250.0000 mg | DELAYED_RELEASE_TABLET | Freq: Three times a day (TID) | ORAL | Status: DC
  Administered 2020-01-04: 250 mg via ORAL
  Filled 2020-01-04 (×4): qty 1

## 2020-01-04 MED ORDER — SODIUM CHLORIDE 0.9 % IV SOLN: 2.0000 g | Freq: Once | INTRAVENOUS | Status: DC

## 2020-01-04 MED ORDER — SODIUM CHLORIDE 0.9 % IV SOLN
2.0000 g | Freq: Once | INTRAVENOUS | Status: AC
  Administered 2020-01-04: 2 g via INTRAVENOUS
  Filled 2020-01-04: qty 2

## 2020-01-04 MED ORDER — DOCUSATE SODIUM 50 MG/5ML PO LIQD
200.0000 mg | Freq: Two times a day (BID) | ORAL | Status: DC | PRN
  Filled 2020-01-04: qty 20

## 2020-01-04 MED ORDER — LACTATED RINGERS IV BOLUS (SEPSIS)
500.0000 mL | Freq: Once | INTRAVENOUS | Status: AC
  Administered 2020-01-04: 500 mL via INTRAVENOUS

## 2020-01-04 MED ORDER — CHLORHEXIDINE GLUCONATE 0.12 % MT SOLN
15.0000 mL | Freq: Two times a day (BID) | OROMUCOSAL | Status: DC
  Administered 2020-01-04 – 2020-01-05 (×4): 15 mL via OROMUCOSAL
  Filled 2020-01-04 (×3): qty 15

## 2020-01-04 MED ORDER — VANCOMYCIN HCL IN DEXTROSE 1-5 GM/200ML-% IV SOLN
1000.0000 mg | INTRAVENOUS | Status: DC
  Administered 2020-01-05: 1000 mg via INTRAVENOUS
  Filled 2020-01-04: qty 200

## 2020-01-04 MED ORDER — VALPROIC ACID 250 MG/5ML PO SOLN
250.0000 mg | Freq: Three times a day (TID) | ORAL | Status: DC
  Administered 2020-01-05: 250 mg
  Filled 2020-01-04: qty 5

## 2020-01-04 MED ORDER — RISPERIDONE 1 MG PO TABS: 2.0000 mg | ORAL_TABLET | Freq: Every day | ORAL | Status: DC

## 2020-01-04 MED ORDER — METRONIDAZOLE IN NACL 5-0.79 MG/ML-% IV SOLN
500.0000 mg | Freq: Once | INTRAVENOUS | Status: AC
  Administered 2020-01-04: 500 mg via INTRAVENOUS
  Filled 2020-01-04: qty 100

## 2020-01-04 MED ORDER — RISPERIDONE 2 MG PO TABS: 2.0000 mg | ORAL_TABLET | Freq: Two times a day (BID) | ORAL | Status: DC

## 2020-01-04 MED ORDER — DOCUSATE SODIUM 100 MG PO CAPS: 100.0000 mg | ORAL_CAPSULE | Freq: Two times a day (BID) | ORAL | Status: DC | PRN

## 2020-01-04 MED ORDER — VANCOMYCIN HCL IN DEXTROSE 1-5 GM/200ML-% IV SOLN: 1000.0000 mg | Freq: Once | INTRAVENOUS | Status: DC

## 2020-01-04 MED ORDER — MOMETASONE FURO-FORMOTEROL FUM 200-5 MCG/ACT IN AERO
2.0000 | INHALATION_SPRAY | Freq: Two times a day (BID) | RESPIRATORY_TRACT | Status: DC
  Administered 2020-01-04 – 2020-01-10 (×12): 2 via RESPIRATORY_TRACT
  Filled 2020-01-04: qty 8.8

## 2020-01-04 MED ORDER — CHLORHEXIDINE GLUCONATE CLOTH 2 % EX PADS
6.0000 | MEDICATED_PAD | Freq: Every day | CUTANEOUS | Status: DC
  Administered 2020-01-04 – 2020-01-10 (×7): 6 via TOPICAL

## 2020-01-04 MED ORDER — SODIUM CHLORIDE 0.9 % IV BOLUS
1000.0000 mL | Freq: Once | INTRAVENOUS | Status: AC
  Administered 2020-01-04: 1000 mL via INTRAVENOUS

## 2020-01-04 MED ORDER — ACETAMINOPHEN 650 MG RE SUPP
650.0000 mg | Freq: Four times a day (QID) | RECTAL | Status: DC | PRN
  Administered 2020-01-04: 650 mg via RECTAL
  Filled 2020-01-04: qty 1

## 2020-01-04 MED ORDER — RISPERIDONE 1 MG PO TABS
2.0000 mg | ORAL_TABLET | Freq: Every day | ORAL | Status: DC
  Administered 2020-01-05 – 2020-01-08 (×4): 2 mg
  Filled 2020-01-04 (×4): qty 2

## 2020-01-04 NOTE — ED Provider Notes (Signed)
Update patient is intermittently hypotensive, but is more awake and alert. Discussed the case with critical care, the morning team will be seeing the patient.   Ripley Fraise, MD 01/04/20 [phone]

## 2020-01-04 NOTE — Progress Notes (Signed)
Pharmacy Antibiotic Note  Lee House is a 54 y.o. male admitted on 01/04/2020 with urosepsis, AKI from obstructive uropathy.  History significant for neurogenic bladder with chronic Foley catheter, recurrent Pseudomonas and Enterobacter UTI.  Pharmacy has been consulted for Vancomycin and Cefepime dosing. Tm 101.8, WBC 13.8, SCr 2.26 (baseline SCr < 1), LA 3.3  Plan: Cefepime 2g IV q12h Vancomycin 1500 mg IV x1 then 1g IV q24h. Follow up renal function, culture results, and clinical course.   Height: 6\' 1"  (185.4 cm) Weight: 77.1 kg (170 lb) (ppw from home) IBW/kg (Calculated) : 79.9  Temp (24hrs), Avg:100.1 F (37.8 C), Min:97.8 F (36.6 C), Max:101.8 F (38.8 C)  Recent Labs  Lab 01/04/20 0108 01/04/20 0147 01/04/20 0445 01/04/20 0541  WBC 13.8*  --   --   --   CREATININE 3.53*  --   --  2.26*  LATICACIDVEN  --  3.6* 3.3*  --     Estimated Creatinine Clearance: 40.3 mL/min (A) (by C-G formula based on SCr of 2.26 mg/dL (H)).    Allergies  Allergen Reactions   Penicillins Anaphylaxis    Can take cephalosporins    Antimicrobials this admission: 9/18 Cefepime >> 9/18 Vancomycin >>   Dose adjustments this admission:   Microbiology results: 9/18 COVID: neg 9/18 UCx: 9/18 BCx:   Thank you for allowing pharmacy to be a part of this patients care.  Gretta Arab PharmD, BCPS Clinical Pharmacist [facility] main pharmacy [phone] 01/04/2020 8:41 AM

## 2020-01-04 NOTE — Progress Notes (Signed)
SLP Cancellation Note  Patient Details Name: Lee House MRN: [id number] DOB: [birth year]   Cancelled treatment:       Reason Eval/Treat Not Completed: Medical issues which prohibited therapy; pt vomiting brownish-black liquid per nursing; lethargic; nursing deferred; ST will attempt at a later time as pt able.   Elvina Sidle, M.S., CCC-SLP 01/04/2020, 3:15 PM

## 2020-01-04 NOTE — Progress Notes (Signed)
PHARMACY - PHYSICIAN COMMUNICATION CRITICAL VALUE ALERT - BLOOD CULTURE IDENTIFICATION (BCID)  Lee House is an 54 y.o. male who presented to [facility] on 01/04/2020 with a chief complaint of urosepsis   Name of physician (or Provider) Contacted: Dr Corinna Lines  Current antibiotics: cefepime and Vanc  Changes to prescribed antibiotics recommended:  none  Results for orders placed or performed during the hospital encounter of 01/04/20  Blood Culture ID Panel (Reflexed) (Collected: 01/04/2020  2:06 AM)  Result Value Ref Range   Enterococcus faecalis NOT DETECTED NOT DETECTED   Enterococcus Faecium NOT DETECTED NOT DETECTED   Listeria monocytogenes NOT DETECTED NOT DETECTED   Staphylococcus species NOT DETECTED NOT DETECTED   Staphylococcus aureus (BCID) NOT DETECTED NOT DETECTED   Staphylococcus epidermidis NOT DETECTED NOT DETECTED   Staphylococcus lugdunensis NOT DETECTED NOT DETECTED   Streptococcus species NOT DETECTED NOT DETECTED   Streptococcus agalactiae NOT DETECTED NOT DETECTED   Streptococcus pneumoniae NOT DETECTED NOT DETECTED   Streptococcus pyogenes NOT DETECTED NOT DETECTED   A.calcoaceticus-baumannii NOT DETECTED NOT DETECTED   Bacteroides fragilis NOT DETECTED NOT DETECTED   Enterobacterales DETECTED (A) NOT DETECTED   Enterobacter cloacae complex NOT DETECTED NOT DETECTED   Escherichia coli NOT DETECTED NOT DETECTED   Klebsiella aerogenes NOT DETECTED NOT DETECTED   Klebsiella oxytoca NOT DETECTED NOT DETECTED   Klebsiella pneumoniae NOT DETECTED NOT DETECTED   Proteus species NOT DETECTED NOT DETECTED   Salmonella species NOT DETECTED NOT DETECTED   Serratia marcescens NOT DETECTED NOT DETECTED   Haemophilus influenzae NOT DETECTED NOT DETECTED   Neisseria meningitidis NOT DETECTED NOT DETECTED   Pseudomonas aeruginosa NOT DETECTED NOT DETECTED   Stenotrophomonas maltophilia NOT DETECTED NOT DETECTED   Candida albicans NOT DETECTED NOT DETECTED    Candida auris NOT DETECTED NOT DETECTED   Candida glabrata NOT DETECTED NOT DETECTED   Candida krusei NOT DETECTED NOT DETECTED   Candida parapsilosis NOT DETECTED NOT DETECTED   Candida tropicalis NOT DETECTED NOT DETECTED   Cryptococcus neoformans/gattii NOT DETECTED NOT DETECTED   CTX-M ESBL NOT DETECTED NOT DETECTED   Carbapenem resistance IMP NOT DETECTED NOT DETECTED   Carbapenem resistance KPC NOT DETECTED NOT DETECTED   Carbapenem resistance NDM NOT DETECTED NOT DETECTED   Carbapenem resist OXA 48 LIKE NOT DETECTED NOT DETECTED   Carbapenem resistance VIM NOT DETECTED NOT DETECTED    Dolly Rias RPh 01/04/2020, 9:03 PM

## 2020-01-04 NOTE — ED Notes (Signed)
Critical Care MD at bedside.  

## 2020-01-04 NOTE — Progress Notes (Signed)
During admission assessment, small red circular areas noted BLE and flaky small areas noted to chest. Dr. Vaughan Browner in to assess skin condition. Will continue to monitor.

## 2020-01-04 NOTE — ED Triage Notes (Signed)
Pt came in from [facility] with c/o decreased level of consciousness, lethargy, tachycardia. EMS said that on arrival pt was hard to arouse and HR was 140. Pt got 1500cc of fluid in the truck. His HR is currently 126. Oral temp is 97.8. Pt O2 saturation was 94% on RA. Placed on 1L. Pt is breathing at 26 times/min. Current B/Ps are 90s/60 and 80s/60s. MAP is above 70

## 2020-01-04 NOTE — ED Provider Notes (Signed)
[facility] DEPT Provider Note   CSN: [id number] Arrival date & time: 01/04/20  0100     History Chief Complaint  Patient presents with   Fatigue   Tachycardia   Level five caveat due to acuity of condition Lee House is a 54 y.o. male.  The history is provided by the patient and the EMS personnel. The history is limited by the condition of the patient.  Altered Mental Status Presenting symptoms: confusion   Severity:  Severe Timing:  Constant Progression:  Worsening Chronicity:  New Associated symptoms: fever   Patient history of COPD, schizoaffective disorder presents from nursing facility.  It reported patient had decreased consciousness, lethargy and tachycardia.  Patient also has a fever.  Due to tachycardia, patient did receive IV fluids in route.  Patient was also hypoxic initially.  No other details known on arrival     Past Medical History:  Diagnosis Date   Asthma    Bipolar 1 disorder ([place])    COPD (chronic obstructive pulmonary disease) (HCC)    GERD (gastroesophageal reflux disease)    Schizo affective schizophrenia [facility])     Patient Active Problem List   Diagnosis Date Noted   Sepsis ([place]) 09/30/2019   Catheter-associated urinary tract infection ([place]) 09/29/2019   Neurogenic bladder 09/29/2019   AMS (altered mental status) 09/09/2019   Lower urinary tract infectious disease 09/09/2019   Schizophrenia ([place]) 09/09/2019   COPD with acute bronchitis ([place]) 09/09/2019   Distal radial fracture 02/27/2012   Anemia 06/02/2011   Cellulitis and abscess of leg 06/01/2011   Altered mental status 06/01/2011   Thrombocytopenia ([place]) 06/01/2011    Past Surgical History:  Procedure Laterality Date   CARPAL TUNNEL RELEASE  02/27/2012   Procedure: CARPAL TUNNEL RELEASE;  Surgeon: Schuyler Amor, MD;  Location: [facility];  Service: Orthopedics;  Laterality: Left;   OPEN REDUCTION INTERNAL FIXATION (ORIF)  DISTAL RADIAL FRACTURE  02/27/2012   Procedure: OPEN REDUCTION INTERNAL FIXATION (ORIF) DISTAL RADIAL FRACTURE;  Surgeon: Schuyler Amor, MD;  Location: [facility];  Service: Orthopedics;  Laterality: Left;  Open reduction internal fixation of  left distal radius fracture.        No family history on file.  Social History   Tobacco Use   Smoking status: Former Smoker   Smokeless tobacco: Never Used  Substance Use Topics   Alcohol use: No   Drug use: No    Home Medications Prior to Admission medications   Medication Sig Start Date End Date Taking? Authorizing Provider  acetaminophen (TYLENOL) 325 MG tablet Take 2 tablets (650 mg total) by mouth every 6 (six) hours as needed for mild pain (or Fever >/= 101). 10/11/19   Domenic Polite, MD  albuterol (PROVENTIL HFA;VENTOLIN HFA) 108 (90 BASE) MCG/ACT inhaler Inhale 2 puffs into the lungs every 4 (four) hours as needed for shortness of breath.     [provider]  ALPRAZolam Duanne Moron) 0.25 MG tablet Take 1 tablet (0.25 mg total) by mouth 2 (two) times daily as needed for anxiety. 10/11/19   Domenic Polite, MD  ARIPiprazole (ABILIFY) 10 MG tablet Take 10 mg by mouth in the morning and at bedtime.     [provider]  aspirin EC 81 MG tablet Take 81 mg by mouth in the morning and at bedtime. Swallow whole.    [provider]  benztropine (COGENTIN) 0.5 MG tablet Take 0.5 mg by mouth 2 (two) times daily.    [provider]  Calcium Carb-Cholecalciferol (CALCIUM 500 +D PO) Take 1 tablet by mouth daily.    [provider]  divalproex (DEPAKOTE) 125 MG DR tablet Take 125 mg by mouth 3 (three) times daily.    [provider]  ferrous sulfate (FEROSUL) 325 (65 FE) MG tablet Take 325 mg by mouth 2 (two) times daily with a meal.    [provider]  Fluticasone-Salmeterol (ADVAIR) 250-50 MCG/DOSE AEPB Inhale 1 puff into the lungs every 12 (twelve) hours.    [provider]    meloxicam (MOBIC) 7.5 MG tablet Take 7.5 mg by mouth daily.    [provider]  Multiple Vitamins-Minerals (CEROVITE SENIOR) TABS Take 1 tablet by mouth daily.    [provider]  OLANZapine (ZYPREXA) 10 MG tablet Take 10 mg by mouth 2 (two) times daily.     [provider]  oxyCODONE (OXY IR/ROXICODONE) 5 MG immediate release tablet Take 1 tablet (5 mg total) by mouth every 6 (six) hours as needed for moderate pain or severe pain. 10/11/19   Domenic Polite, MD  pantoprazole (PROTONIX) 40 MG tablet Take 40 mg by mouth daily.    [provider]  risperidone (RISPERDAL) 4 MG tablet Take 0.5-1 tablets (2-4 mg total) by mouth 2 (two) times daily. Take 2mg  in am and 4mg  in pm 10/11/19   Domenic Polite, MD  senna-docusate (SENOKOT-S) 8.6-50 MG tablet Take 1 tablet by mouth 2 (two) times daily as needed for constipation. 08/23/19   [provider]  tamsulosin (FLOMAX) 0.4 MG CAPS capsule Take 0.4 mg by mouth daily.    [provider]  traZODone (DESYREL) 50 MG tablet Take 50 mg by mouth at bedtime as needed for sleep.     [provider]    Allergies    Penicillins  Review of Systems   Review of Systems  Unable to perform ROS: Acuity of condition  Constitutional: Positive for fever.  Psychiatric/Behavioral: Positive for confusion.    Physical Exam Updated Vital Signs BP (!) 94/59    Pulse (!) 128    Temp (!) 101.8 F (38.8 C) (Rectal)    Resp (!) 34    Ht 1.854 m (6\' 1" )    Wt 77.1 kg Comment: ppw from home   SpO2 100%    BMI 22.43 kg/m   Physical Exam CONSTITUTIONAL: Disheveled, ill-appearing HEAD: Normocephalic/atraumatic EYES: EOMI/PERRL ENMT: Mucous membranes moist NECK: supple no meningeal signs SPINE/BACK:entire spine nontender CV: S1/S2 noted, tachycardic LUNGS: Tachypneic ABDOMEN: soft, suprapubic tenderness noted, no rebound or guarding, bowel sounds noted throughout abdomen GU: Foley catheter in place on  arrival NEURO: Pt is awake/alert but he appears confused.  He moves all extremities x4. EXTREMITIES: pulses normal/equal, full ROM, no deformity SKIN: warm, color normal, no skin breakdown to sacrum PSYCH: Unable to assess  ED Results / Procedures / Treatments   Labs (all labs ordered are listed, but only abnormal results are displayed) Labs Reviewed  COMPREHENSIVE METABOLIC PANEL - Abnormal; Notable for the following components:      Result Value   Sodium 129 (*)    CO2 15 (*)    Glucose, Bld 121 (*)    BUN 68 (*)    Creatinine, Ser 3.53 (*)    Calcium 8.2 (*)    Albumin 3.3 (*)    GFR calc non Af Amer 18 (*)    GFR calc Af Amer 21 (*)    All other components within normal limits  LACTIC ACID,  PLASMA - Abnormal; Notable for the following components:   Lactic Acid, Venous 3.6 (*)    All other components within normal limits  CBC WITH DIFFERENTIAL/PLATELET - Abnormal; Notable for the following components:   WBC 13.8 (*)    RDW 16.1 (*)    Platelets 146 (*)    Neutro Abs 12.1 (*)    Lymphs Abs 0.5 (*)    Abs Immature Granulocytes 0.25 (*)    All other components within normal limits  PROTIME-INR - Abnormal; Notable for the following components:   Prothrombin Time 18.2 (*)    INR 1.6 (*)    All other components within normal limits  URINALYSIS, ROUTINE W REFLEX MICROSCOPIC - Abnormal; Notable for the following components:   Color, Urine RED (*)    APPearance TURBID (*)    Glucose, UA   (*)    Value: TEST NOT REPORTED DUE TO COLOR INTERFERENCE OF URINE PIGMENT   Hgb urine dipstick   (*)    Value: TEST NOT REPORTED DUE TO COLOR INTERFERENCE OF URINE PIGMENT   Bilirubin Urine   (*)    Value: TEST NOT REPORTED DUE TO COLOR INTERFERENCE OF URINE PIGMENT   Ketones, ur   (*)    Value: TEST NOT REPORTED DUE TO COLOR INTERFERENCE OF URINE PIGMENT   Protein, ur   (*)    Value: TEST NOT REPORTED DUE TO COLOR INTERFERENCE OF URINE PIGMENT   Nitrite   (*)    Value: TEST NOT  REPORTED DUE TO COLOR INTERFERENCE OF URINE PIGMENT   Leukocytes,Ua   (*)    Value: TEST NOT REPORTED DUE TO COLOR INTERFERENCE OF URINE PIGMENT   RBC / HPF >50 (*)    Bacteria, UA MANY (*)    All other components within normal limits  VALPROIC ACID LEVEL - Abnormal; Notable for the following components:   Valproic Acid Lvl 32 (*)    All other components within normal limits  SARS CORONAVIRUS 2 BY RT PCR (HOSPITAL ORDER, [facility])  CULTURE, BLOOD (ROUTINE X 2)  CULTURE, BLOOD (ROUTINE X 2)  URINE CULTURE  LACTIC ACID, PLASMA  BASIC METABOLIC PANEL    EKG EKG Interpretation  Date/Time:  Saturday January 04 2020 02:30:59 EDT Ventricular Rate:  124 PR Interval:    QRS Duration: 98 QT Interval:  289 QTC Calculation: 415 R Axis:   32 Text Interpretation: Sinus tachycardia Interpretation limited secondary to artifact Confirmed by Ripley Fraise [phone]) on 01/04/2020 2:50:15 AM   Radiology DG Chest Port 1 View  Result Date: 01/04/2020 CLINICAL DATA:  Lethargy, tachycardia EXAM: PORTABLE CHEST 1 VIEW COMPARISON:  09/29/2019 FINDINGS: The heart size and mediastinal contours are within normal limits. Both lungs are clear. The visualized skeletal structures are unremarkable. IMPRESSION: No active disease. Electronically Signed   By: Randa Ngo M.D.   On: 01/04/2020 01:47    Procedures .Critical Care Performed by: Ripley Fraise, MD Authorized by: Ripley Fraise, MD   Critical care provider statement:    Critical care time (minutes):  100   Critical care start time:  01/04/2020 2:20 AM   Critical care end time:  01/04/2020 4:00 AM   Critical care time was exclusive of:  Separately billable procedures and treating other patients   Critical care was necessary to treat or prevent imminent or life-threatening deterioration of the following conditions:  Respiratory failure, sepsis and shock   Critical care was time spent personally by me on the  following  activities:  Development of treatment plan with patient or surrogate, discussions with consultants, evaluation of patient's response to treatment, examination of patient, re-evaluation of patient's condition, pulse oximetry, ordering and review of radiographic studies, ordering and review of laboratory studies, ordering and performing treatments and interventions, obtaining history from patient or surrogate and review of old charts   I assumed direction of critical care for this patient from another provider in my specialty: no        Medications Ordered in ED Medications  lactated ringers infusion ( Intravenous New Bag/Given 01/04/20 0355)  metroNIDAZOLE (FLAGYL) IVPB 500 mg (500 mg Intravenous New Bag/Given 01/04/20 0355)  vancomycin (VANCOREADY) IVPB 1500 mg/300 mL (1,500 mg Intravenous New Bag/Given 01/04/20 0324)  lactated ringers bolus 1,000 mL (0 mLs Intravenous Stopped 01/04/20 0300)    And  lactated ringers bolus 1,000 mL (0 mLs Intravenous Stopped 01/04/20 0215)    And  lactated ringers bolus 500 mL (0 mLs Intravenous Stopped 01/04/20 0345)  ceFEPIme (MAXIPIME) 2 g in sodium chloride 0.9 % 100 mL IVPB (2 g Intravenous New Bag/Given 01/04/20 0228)  acetaminophen (TYLENOL) suppository 650 mg (650 mg Rectal Given 01/04/20 0330)    ED Course  I have reviewed the triage vital signs and the nursing notes.  Pertinent labs & imaging results that were available during my care of the patient were reviewed by me and considered in my medical decision making (see chart for details).    MDM Rules/Calculators/A&P                          2:23 AM Patient seen on arrival.  Patient febrile, tachycardic and hypotensive.  Code sepsis called. Patient's Foley catheter appears to not been changed recently.  It appeared to be obstructed as he had suprapubic tenderness and fullness.  Nursing staff replaced the Foley and he had over 2 L of urine output. IV fluids and antibiotics have been  ordered. 3:51 AM Patient's vital signs are improving. Lactate is elevated. He is received IV fluids and IV antibiotics. Patient appears to have urosepsis.   Sepsis - Repeat Assessment  Sepsis hemodynamic reassessment completed at: 3:52 AM BP 102/63    Pulse (!) 123    Temp (!) 101.8 F (38.8 C) (Rectal)    Resp (!) 24    Ht 1.854 m (6\' 1" )    Wt 77.1 kg Comment: ppw from home   SpO2 100%    BMI 22.43 kg/m    4:33 AM Patient received multiple liters of IV fluid, systolic blood pressure is 88.  Heart rates in the 120s.  Patient overall is improving, no pain complaints Discussed with critical care for evaluation admission due to persistent hypotension    This patient presents to the ED for concern of fever and altered mental status, this involves an extensive number of treatment options, and is a complaint that carries with it a high risk of complications and morbidity.  The differential diagnosis includes sepsis, pneumonia, UTI, urosepsis, meningitis   Lab Tests:   I Ordered, reviewed, and interpreted labs, which included electrolytes, lactate, blood culture, urinalysis, CBC  Medicines ordered:   I ordered medication fluids and antibiotics for sepsis  Imaging Studies ordered:   I ordered imaging studies which included chest x-ray   I independently visualized and interpreted imaging which showed no acute findings  Additional history obtained:   Previous records obtained and reviewed   Consultations Obtained:   I consulted critical care  and discussed lab and imaging findings  Reevaluation:  After the interventions stated above, I reevaluated the patient and found patient stabilized  Critical Interventions:   4 L normal saline as well as IV antibiotics.  Patient was given 1519ml prior to arrival and an additional 2500 mL in the ER   Final Clinical Impression(s) / ED Diagnoses Final diagnoses:  Acute cystitis with hematuria  Dehydration  AKI (acute kidney  injury) [facility])    Rx / [place] Orders ED Discharge Orders    None       Ripley Fraise, MD 01/04/20 [phone]

## 2020-01-04 NOTE — Progress Notes (Signed)
A consult was received from an ED physician for Aztreonam & Vancomycin per pharmacy dosing.  The patient's profile has been reviewed for ht/wt/allergies/indication/available labs.    Per chart review, there is documentation of patient receiving multiple courses of cephalosporins in the past including cefazolin, cefepime, ceftriaxone and cefdinir.  Therefore, will change Aztreonam to Cefepime per consult.    A one time order has been placed for Cefepime 2gm & Vancomycin 1500mg  IV.  Further antibiotics/pharmacy consults should be ordered by admitting physician if indicated.                       Thank you, Netta Cedars PharmD 01/04/2020  1:41 AM

## 2020-01-04 NOTE — H&P (Addendum)
NAME:  Lee House, MRN:  [id number], DOB:  29-Jun-1965, LOS: 0 ADMISSION DATE:  01/04/2020, CONSULTATION DATE: 01/04/2020 REFERRING MD: Dr. Christy Gentles, ED physician, CHIEF COMPLAINT: Urosepsis, obstructive uropathy  Brief History   54 year old with asthma, COPD, GERD, bipolar, schizoaffective disorder, neurogenic bladder with chronic indwelling Foley's, recurrent UTIs with Pseudomonas, Enterobacter  Admitted with urosepsis, obstructive uropathy.  Patient has received 4 lt IV fluids resuscitation and Vanco, cefepime, Flagyl..  Patient was hypoxic initially which resolved.  Chronic Foley's catheter appeared obstructed and was replaced with poor 2 L of urine output.  PCCM consulted for intermittent hypotension and elevated lactic acid.  History of present illness    Past Medical History    has a past medical history of Asthma, Bipolar 1 disorder ([place]), COPD (chronic obstructive pulmonary disease) ([place]), GERD (gastroesophageal reflux disease), and Schizo affective schizophrenia ([place]).  Significant Hospital Events   9/18-Admit  Consults:   PCCM  Procedures:    Significant Diagnostic Tests:    Micro Data:  Bcx 9/18 > Ucx 9/18 >  Antimicrobials:  Vanco 9/18 >> Cefepime 9/18 >> Flagyl 9/18  Interim history/subjective:    Objective   Blood pressure (!) 83/60, pulse (!) 130, temperature (!) 100.8 F (38.2 C), temperature source Rectal, resp. rate (!) 23, height 6\' 1"  (1.854 m), weight 77.1 kg, SpO2 96 %.        Intake/Output Summary (Last 24 hours) at 01/04/2020 0747 Last data filed at 01/04/2020 0212 Gross per 24 hour  Intake --  Output 800 ml  Net -800 ml   Filed Weights   01/04/20 0128  Weight: 77.1 kg    Examination: Gen:      No acute distress, chronically ill-appearing HEENT:  EOMI, sclera anicteric Neck:     No masses; no thyromegaly Lungs:    Clear to auscultation bilaterally; normal respiratory effort CV:         Regular rate and rhythm; no  murmurs Abd:      + bowel sounds; soft, non-tender; no palpable masses, no distension Ext:    No edema; adequate peripheral perfusion Skin:      Warm and dry; no rash Neuro: Awake, mumbles incomprehensibly when asked questions.  Resolved Hospital Problem list     Assessment & Plan:  Urosepsis Neurogenic bladder with chronic Foley's Continue lactated Ringer at 150 an hour Trend lactic acid Continue Vanco, cefepime.  Follow cultures  Acute kidney injury Secondary to obstructive uropathy Improved after Foley was replaced Monitor renal function  Bipolar, schizophrenia Acute metabolic encephalopathy secondary to sepsis Continue Depakote, Zyprexa, Risperdal, Abilify  COPD, asthma Continue inhalers Titrate down oxygen as tolerated Chest x-ray looks clear   Best practice:  Diet: NPO Pain/Anxiety/Delirium protocol (if indicated): NA VAP protocol (if indicated): NA DVT prophylaxis: Heparin subcu GI prophylaxis: PPI Glucose control: Monitor Mobility: Bed Code Status: Full Family Communication: Family called and left a voice message  Disposition: ICU  Labs   CBC: Recent Labs  Lab 01/04/20 0108  WBC 13.8*  NEUTROABS 12.1*  HGB 15.0  HCT 44.6  MCV 81.8  PLT 146*    Basic Metabolic Panel: Recent Labs  Lab 01/04/20 0108 01/04/20 0541  NA 129* 132*  K 4.6 4.7  CL 101 104  CO2 15* 16*  GLUCOSE 121* 113*  BUN 68* 53*  CREATININE 3.53* 2.26*  CALCIUM 8.2* 8.2*   GFR: Estimated Creatinine Clearance: 40.3 mL/min (A) (by C-G formula based on SCr of 2.26 mg/dL (H)). Recent Labs  Lab 01/04/20  3151 01/04/20 0147 01/04/20 0445  WBC 13.8*  --   --   LATICACIDVEN  --  3.6* 3.3*    Liver Function Tests: Recent Labs  Lab 01/04/20 0108  AST 29  ALT 13  ALKPHOS 74  BILITOT 0.7  PROT 6.7  ALBUMIN 3.3*   No results for input(s): LIPASE, AMYLASE in the last 168 hours. No results for input(s): AMMONIA in the last 168 hours.  ABG No results found for: PHART,  PCO2ART, PO2ART, HCO3, TCO2, ACIDBASEDEF, O2SAT   Coagulation Profile: Recent Labs  Lab 01/04/20 0108  INR 1.6*    Cardiac Enzymes: No results for input(s): CKTOTAL, CKMB, CKMBINDEX, TROPONINI in the last 168 hours.  HbA1C: No results found for: HGBA1C  CBG: No results for input(s): GLUCAP in the last 168 hours.  Review of Systems:   Unable to obtain due to encephalopathy  Past Medical History  He,  has a past medical history of Asthma, Bipolar 1 disorder ([place]), COPD (chronic obstructive pulmonary disease) ([place]), GERD (gastroesophageal reflux disease), and Schizo affective schizophrenia ([place]).   Surgical History    Past Surgical History:  Procedure Laterality Date   CARPAL TUNNEL RELEASE  02/27/2012   Procedure: CARPAL TUNNEL RELEASE;  Surgeon: Schuyler Amor, MD;  Location: [facility];  Service: Orthopedics;  Laterality: Left;   OPEN REDUCTION INTERNAL FIXATION (ORIF) DISTAL RADIAL FRACTURE  02/27/2012   Procedure: OPEN REDUCTION INTERNAL FIXATION (ORIF) DISTAL RADIAL FRACTURE;  Surgeon: Schuyler Amor, MD;  Location: [facility];  Service: Orthopedics;  Laterality: Left;  Open reduction internal fixation of  left distal radius fracture.      Social History   reports that he has quit smoking. He has never used smokeless tobacco. He reports that he does not drink alcohol and does not use drugs.   Family History   His family history is not on file.   Allergies Allergies  Allergen Reactions   Penicillins Anaphylaxis    Can take cephalosporins     Home Medications  Prior to Admission medications   Medication Sig Start Date End Date Taking? Authorizing Provider  acetaminophen (TYLENOL) 325 MG tablet Take 2 tablets (650 mg total) by mouth every 6 (six) hours as needed for mild pain (or Fever >/= 101). 10/11/19  Yes Domenic Polite, MD  albuterol (PROVENTIL HFA;VENTOLIN HFA) 108 (90 BASE) MCG/ACT inhaler Inhale 2 puffs into the lungs every 4 (four) hours as needed for  shortness of breath.    Yes [provider]  ALPRAZolam (XANAX) 0.25 MG tablet Take 1 tablet (0.25 mg total) by mouth 2 (two) times daily as needed for anxiety. Patient taking differently: Take 0.5 mg by mouth 2 (two) times daily as needed for anxiety.  10/11/19  Yes Domenic Polite, MD  aspirin EC 81 MG tablet Take 81 mg by mouth in the morning and at bedtime. Swallow whole.   Yes [provider]  benztropine (COGENTIN) 0.5 MG tablet Take 0.5 mg by mouth 2 (two) times daily.   Yes [provider]  Calcium Carb-Cholecalciferol (CALCIUM 500 +D PO) Take 1 tablet by mouth daily.   Yes [provider]  divalproex (DEPAKOTE) 250 MG DR tablet Take 250 mg by mouth 3 (three) times daily.    Yes [provider]  ferrous sulfate (FEROSUL) 325 (65 FE) MG tablet Take 325 mg by mouth 2 (two) times daily with a meal.   Yes [provider]  Fluticasone-Salmeterol (ADVAIR) 250-50 MCG/DOSE AEPB Inhale 1 puff into the  lungs every 12 (twelve) hours.   Yes [provider]  meloxicam (MOBIC) 7.5 MG tablet Take 7.5 mg by mouth daily.   Yes [provider]  Multiple Vitamins-Minerals (CEROVITE SENIOR) TABS Take 1 tablet by mouth daily.   Yes [provider]  OLANZapine (ZYPREXA) 10 MG tablet Take 10 mg by mouth 2 (two) times daily.    Yes [provider]  oxyCODONE (OXY IR/ROXICODONE) 5 MG immediate release tablet Take 1 tablet (5 mg total) by mouth every 6 (six) hours as needed for moderate pain or severe pain. 10/11/19  Yes Domenic Polite, MD  pantoprazole (PROTONIX) 40 MG tablet Take 40 mg by mouth daily.   Yes [provider]  risperidone (RISPERDAL) 4 MG tablet Take 0.5-1 tablets (2-4 mg total) by mouth 2 (two) times daily. Take 2mg  in am and 4mg  in pm Patient taking differently: Take 2 mg by mouth daily.  10/11/19  Yes Domenic Polite, MD  senna-docusate (SENOKOT-S) 8.6-50 MG tablet Take 1 tablet by mouth 2 (two) times  daily as needed for constipation. 08/23/19  Yes [provider]  tamsulosin (FLOMAX) 0.4 MG CAPS capsule Take 0.4 mg by mouth daily.   Yes [provider]  torsemide (DEMADEX) 20 MG tablet Take 20 mg by mouth 2 (two) times daily. Currently taking 1 tab daily b/c of BP being low   Yes [provider]  traZODone (DESYREL) 50 MG tablet Take 50 mg by mouth at bedtime as needed for sleep.    Yes [provider]  ARIPiprazole (ABILIFY) 10 MG tablet Take 10 mg by mouth in the morning and at bedtime.     [provider]     Critical care time: 73    The patient is critically ill with multiple organ system failure and requires high complexity decision making for assessment and support, frequent evaluation and titration of therapies, advanced monitoring, review of radiographic studies and interpretation of complex data.   Critical Care Time devoted to patient care services, exclusive of separately billable procedures, described in this note is 45 minutes.   Marshell Garfinkel MD [facility] Please see [email or web address] for pager details.  01/04/2020, 8:37 AM

## 2020-01-04 NOTE — Progress Notes (Signed)
[facility] Progress Note Patient Name: Lee House DOB: [birth year] MRN: [id number]   Date of Service  01/04/2020  HPI/Events of Note  Lactic Acid = 2.7 --> 3.7 --> 3.4. LVEF = 60-65% and Hgb = 15.   eICU Interventions  Plan: 1. Bolus with 0.9 NaCl 1 liter IV over 1 hour now.  2. Repeat Lactic Acid at 5 AM.     Intervention Category Major Interventions: Acid-Base disturbance - evaluation and management  Lysle Dingwall 01/04/2020, 9:32 PM

## 2020-01-05 ENCOUNTER — Inpatient Hospital Stay (HOSPITAL_COMMUNITY): Payer: 59

## 2020-01-05 DIAGNOSIS — K567 Ileus, unspecified: Secondary | ICD-10-CM

## 2020-01-05 DIAGNOSIS — R7881 Bacteremia: Secondary | ICD-10-CM | POA: Diagnosis present

## 2020-01-05 DIAGNOSIS — N179 Acute kidney failure, unspecified: Secondary | ICD-10-CM

## 2020-01-05 DIAGNOSIS — N390 Urinary tract infection, site not specified: Secondary | ICD-10-CM | POA: Diagnosis present

## 2020-01-05 DIAGNOSIS — N139 Obstructive and reflux uropathy, unspecified: Secondary | ICD-10-CM

## 2020-01-05 DIAGNOSIS — F3130 Bipolar disorder, current episode depressed, mild or moderate severity, unspecified: Secondary | ICD-10-CM

## 2020-01-05 DIAGNOSIS — R112 Nausea with vomiting, unspecified: Secondary | ICD-10-CM

## 2020-01-05 DIAGNOSIS — F209 Schizophrenia, unspecified: Secondary | ICD-10-CM

## 2020-01-05 DIAGNOSIS — J449 Chronic obstructive pulmonary disease, unspecified: Secondary | ICD-10-CM

## 2020-01-05 LAB — MAGNESIUM: Magnesium: 1.9 mg/dL (ref 1.7–2.4)

## 2020-01-05 LAB — BASIC METABOLIC PANEL
Anion gap: 7 (ref 5–15)
BUN: 28 mg/dL — ABNORMAL HIGH (ref 6–20)
CO2: 23 mmol/L (ref 22–32)
Calcium: 7.9 mg/dL — ABNORMAL LOW (ref 8.9–10.3)
Chloride: 107 mmol/L (ref 98–111)
Creatinine, Ser: 0.94 mg/dL (ref 0.61–1.24)
GFR calc Af Amer: >60 mL/min (ref >60)
GFR calc non Af Amer: >60 mL/min (ref >60)
Glucose, Bld: 95 mg/dL (ref 70–99)
Potassium: 4.8 mmol/L (ref 3.5–5.1)
Sodium: 137 mmol/L (ref 135–145)

## 2020-01-05 LAB — PHOSPHORUS: Phosphorus: 2.6 mg/dL (ref 2.5–4.6)

## 2020-01-05 LAB — CBC
HCT: 35.9 % — ABNORMAL LOW (ref 39.0–52.0)
Hemoglobin: 11.9 g/dL — ABNORMAL LOW (ref 13.0–17.0)
MCH: 27.4 pg (ref 26.0–34.0)
MCHC: 33.1 g/dL (ref 30.0–36.0)
MCV: 82.5 fL (ref 80.0–100.0)
Platelets: 108 10*3/uL — ABNORMAL LOW (ref 150–400)
RBC: 4.35 MIL/uL (ref 4.22–5.81)
RDW: 16.3 % — ABNORMAL HIGH (ref 11.5–15.5)
WBC: 7.5 10*3/uL (ref 4.0–10.5)
nRBC: 0 % (ref 0.0–0.2)

## 2020-01-05 LAB — LACTIC ACID, PLASMA: Lactic Acid, Venous: 1.9 mmol/L (ref 0.5–1.9)

## 2020-01-05 MED ORDER — SODIUM CHLORIDE 0.9 % IV SOLN
2.0000 g | Freq: Three times a day (TID) | INTRAVENOUS | Status: DC
  Administered 2020-01-05 – 2020-01-10 (×16): 2 g via INTRAVENOUS
  Filled 2020-01-05 (×18): qty 2

## 2020-01-05 MED ORDER — PANTOPRAZOLE SODIUM 40 MG IV SOLR
40.0000 mg | INTRAVENOUS | Status: DC
  Administered 2020-01-05 – 2020-01-08 (×4): 40 mg via INTRAVENOUS
  Filled 2020-01-05 (×4): qty 40

## 2020-01-05 MED ORDER — BISACODYL 10 MG RE SUPP
10.0000 mg | Freq: Every day | RECTAL | Status: DC
  Administered 2020-01-05 – 2020-01-08 (×4): 10 mg via RECTAL
  Filled 2020-01-05 (×4): qty 1

## 2020-01-05 MED ORDER — VALPROATE SODIUM 500 MG/5ML IV SOLN
250.0000 mg | Freq: Three times a day (TID) | INTRAVENOUS | Status: AC
  Administered 2020-01-05 – 2020-01-08 (×11): 250 mg via INTRAVENOUS
  Filled 2020-01-05 (×13): qty 2.5

## 2020-01-05 MED ORDER — VANCOMYCIN HCL IN DEXTROSE 1-5 GM/200ML-% IV SOLN: 1000.0000 mg | Freq: Three times a day (TID) | INTRAVENOUS | Status: DC

## 2020-01-05 NOTE — Progress Notes (Signed)
PROGRESS NOTE    Lee House  KGM:[id number] DOB: 1965-12-09 DOA: 01/04/2020 PCP: Patient, [name] Per   Chief Complaint  Patient presents with  . Fatigue  . Tachycardia    Brief Narrative:  54 year old with asthma, COPD, GERD, bipolar, schizoaffective disorder, neurogenic bladder with chronic indwelling Foley's, recurrent UTIs with Pseudomonas, Enterobacter  Admitted with urosepsis, obstructive uropathy.  Patient has received 4 lt IV fluids resuscitation and Vanco, cefepime, Flagyl..  Patient was hypoxic initially which resolved.  Chronic Foley's catheter appeared obstructed and was replaced with poor 2 L of urine output.  PCCM consulted for intermittent hypotension and elevated lactic acid.   Assessment & Plan:   Principal Problem:   Sepsis ([place]) Active Problems:   Bacteremia due to Gram-negative bacteria   Acute lower UTI   Neurogenic bladder   Sepsis secondary to UTI ([place])   Acute renal failure (HCC)   Nausea & vomiting   Ileus (HCC)   Bipolar affective disorder, current episode depressed ([place])   Chronic obstructive pulmonary disease (HCC)   Obstructive uropathy  1 sepsis secondary to gram-negative bacteremia and UTI Patient admitted with sepsis met criteria for sepsis with elevated lactic acid level, hypotension, acute renal failure, leukocytosis, fever with a temperature of 101.4, tachycardia, tachypnea.  Patient received 2-1/2 L of IV fluid bolus currently on lactated Ringer's at 150 cc/h with improvement with hypotension.  Patient pancultured with preliminary blood cultures positive for gram-negative bacteremia.  Urine cultures pending.  Fever curve trending down.  Continue empiric IV cefepime.  Discontinue IV vancomycin.  If patient continues to have fever we will place back on IV Vanco.  Follow.  2.  Acute renal failure secondary to obstructive uropathy Patient had presented with acute renal failure.  It was felt patient's Foley catheter may have been  blocked.  Foley catheter was replaced with improvement in renal function.  Urine output of 3.5 L over the past 24 hours.  Follow.  3.  Neurogenic bladder with chronic Foley's Foley catheter replaced.  Patient with good urine output.  Continue IV fluids.  Follow.  Will likely need outpatient follow-up with urology.  4.  Bipolar disorder/schizophrenia Continue Depakote, Zyprexa, Resporal, Abilify.  5.  Acute metabolic encephalopathy secondary to sepsis Slowly improving.  Patient drowsy but more alert and following commands and answering questions appropriately.  Blood cultures with gram-negative bacteremia.  Urine cultures pending.  Continue IV cefepime.  IV fluids.  Supportive care.  6.  COPD/asthma Stable.  Chest x-ray on admission was clear.  Titrate O2 as tolerated.  Continue inhalers.  7.  Probable ileus/nausea and emesis Patient noted to have nausea and vomiting yesterday.  Abdominal films with dilated loops of bowel.  NG tube placed.  Output not recorded.  Patient with flatus.  No bowel movement.  Keep potassium > 4, magnesium >2.  Placed on daily Dulcolax suppository.  Repeat abdominal films in the morning.  Follow.   DVT prophylaxis: Heparin Code Status: Full Family Communication: Updated patient.  No family at bedside. Disposition:   Status is: Inpatient    Dispo: The patient is from: Home              Anticipated d/c is to: To be determined              Anticipated d/c date is: To be determined              Patient currently septic, on IV antibiotics, concern for possible ileus with NG tube in  place, cultures pending.  Not stable for discharge.       Consultants:   PCCM admit  Procedures:   Abdominal films 01/04/2020, 01/05/2020  Chest x-ray 01/04/2020  Renal ultrasound 01/04/2020  Micro Data:  Bcx 9/18 > Ucx 9/18 >     Antimicrobials:   IV cefepime 01/04/2020>>>>  IV vancomycin 01/04/2020>>>> 01/05/2020  Flagyl 01/04/2020 x 1  dose   Subjective: Patient laying in bed with NG tube in place sleeping.  Arousable to verbal stimuli.  Stated had some nausea this morning.  Patient noted to have emesis overnight and NG tube had to be placed.  Positive flatus.  No bowel movement.  Denies any chest pain.  No significant shortness of breath.  Some diffuse abdominal discomfort.  Objective: Vitals:   01/05/20 0700 01/05/20 0753 01/05/20 0800 01/05/20 0900  BP: 113/61  107/60 104/77  Pulse: (!) 111  (!) 108 (!) 111  Resp: (!) _0 Temp:  (!) 100.7 F (38.2 C)    TempSrc:  Oral    SpO2: 97% 97% 98% 97%  Weight:      Height:        Intake/Output Summary (Last 24 hours) at 01/05/2020 1055 Last data filed at 01/05/2020 0900 Gross per 24 hour  Intake 4988.3 ml  Output 2650 ml  Net 2338.3 ml   Filed Weights   01/04/20 0128 01/05/20 0500  Weight: 77.1 kg 82.7 kg    Examination:  General exam: Appears calm and comfortable.  NG tube in place.  Drowsy. Respiratory system: Clear to auscultation anterior lung fields.  No wheezes, no crackles, no rhonchi.Marland Kitchen Respiratory effort normal. Cardiovascular system: Tachycardia.  No JVD, no murmurs rubs or gallops.  No lower extremity edema.   Gastrointestinal system: Abdomen is soft, diffusely tender to palpation, nondistended, positive bowel sounds.  No rebound.  No guarding.  Central nervous system: Alert and oriented. No focal neurological deficits.  Moving extremities spontaneously. Extremities: Symmetric 5 x 5 power. Skin: No rashes, lesions or ulcers Psychiatry: Judgement and insight appear normal. Mood & affect appropriate.     Data Reviewed: I have personally reviewed following labs and imaging studies  CBC: Recent Labs  Lab 01/04/20 0108 01/05/20 0314  WBC 13.8* 7.5  NEUTROABS 12.1*  --   HGB 15.0 11.9*  HCT 44.6 35.9*  MCV 81.8 82.5  PLT 146* 108*    Basic Metabolic Panel: Recent Labs  Lab 01/04/20 0108 01/04/20 0541 01/05/20 0314  NA 129* 132*  137  K 4.6 4.7 4.8  CL 101 104 107  CO2 15* 16* 23  GLUCOSE 121* 113* 95  BUN 68* 53* 28*  CREATININE 3.53* 2.26* 0.94  CALCIUM 8.2* 8.2* 7.9*  MG  --   --  1.9  PHOS  --   --  2.6    GFR: Estimated Creatinine Clearance: 100.3 mL/min (by C-G formula based on SCr of 0.94 mg/dL).  Liver Function Tests: Recent Labs  Lab 01/04/20 0108  AST 29  ALT 13  ALKPHOS 74  BILITOT 0.7  PROT 6.7  ALBUMIN 3.3*    CBG: No results for input(s): GLUCAP in the last 168 hours.   Recent Results (from the past 240 hour(s))  Culture, blood (Routine x 2)     Status: None (Preliminary result)   Collection Time: 01/04/20  2:06 AM   Specimen: BLOOD RIGHT HAND  Result Value Ref Range Status   Specimen Description   Final    BLOOD RIGHT HAND Performed  at [facility], [place] [address], [place], [place] [address]    Special Requests   Final    BOTTLES DRAWN AEROBIC AND ANAEROBIC Blood Culture results may not be optimal due to an inadequate volume of blood received in culture bottles Performed at [facility] [address], [place], [place] [address]    Culture  Setup Time   Final    GRAM NEGATIVE RODS IN BOTH AEROBIC AND ANAEROBIC BOTTLES Organism ID to follow CRITICAL RESULT CALLED TO, READ BACK BY AND VERIFIED WITH: PHRMD E JACKSON [email or web address]  01/04/20 BY S GEZAHEGN Performed at [facility], [place] [address] [place], [place] [address]    Culture GRAM NEGATIVE RODS  Final   Report Status PENDING  Incomplete  SARS Coronavirus 2 by RT PCR (hospital order, performed in [facility] hospital lab) Nasopharyngeal Nasopharyngeal Swab     Status: None   Collection Time: 01/04/20  2:06 AM   Specimen: Nasopharyngeal Swab  Result Value Ref Range Status   SARS Coronavirus 2 NEGATIVE NEGATIVE Final    Comment: (NOTE) SARS-CoV-2 target nucleic acids are NOT DETECTED.  The SARS-CoV-2 RNA is generally detectable in upper and lower respiratory specimens during the  acute phase of infection. The lowest concentration of SARS-CoV-2 viral copies this assay can detect is 250 copies / mL. A negative result does not preclude SARS-CoV-2 infection and should not be used as the sole basis for treatment or other patient management decisions.  A negative result may occur with improper specimen collection / handling, submission of specimen other than nasopharyngeal swab, presence of viral mutation(s) within the areas targeted by this assay, and inadequate number of viral copies (<250 copies / mL). A negative result must be combined with clinical observations, patient history, and epidemiological information.  Fact Sheet for Patients:   StrictlyIdeas.no  Fact Sheet for Healthcare Providers: BankingDealers.co.za  This test is not yet approved or  cleared by the [place] FDA and has been authorized for detection and/or diagnosis of SARS-CoV-2 by FDA under an Emergency Use Authorization (EUA).  This EUA will remain in effect (meaning this test can be used) for the duration of the COVID-19 declaration under Section 564(b)(1) of the Act, 21 U.S.C. section 360bbb-3(b)(1), unless the authorization is terminated or revoked sooner.  Performed at [facility], [place] [address] [place], [place] [address]   Blood Culture ID Panel (Reflexed)     Status: Abnormal   Collection Time: 01/04/20  2:06 AM  Result Value Ref Range Status   Enterococcus faecalis NOT DETECTED NOT DETECTED Final   Enterococcus Faecium NOT DETECTED NOT DETECTED Final   Listeria monocytogenes NOT DETECTED NOT DETECTED Final   Staphylococcus species NOT DETECTED NOT DETECTED Final   Staphylococcus aureus (BCID) NOT DETECTED NOT DETECTED Final   Staphylococcus epidermidis NOT DETECTED NOT DETECTED Final   Staphylococcus lugdunensis NOT DETECTED NOT DETECTED Final   Streptococcus species NOT DETECTED NOT DETECTED Final    Streptococcus agalactiae NOT DETECTED NOT DETECTED Final   Streptococcus pneumoniae NOT DETECTED NOT DETECTED Final   Streptococcus pyogenes NOT DETECTED NOT DETECTED Final   A.calcoaceticus-baumannii NOT DETECTED NOT DETECTED Final   Bacteroides fragilis NOT DETECTED NOT DETECTED Final   Enterobacterales DETECTED (A) NOT DETECTED Final    Comment: Enterobacterales represent a large order of gram negative bacteria, not a single organism. Refer to culture for further identification. CRITICAL RESULT CALLED TO, READ BACK BY AND VERIFIED WITH: PHRMD E JACKSON [email or web address]  01/04/20  BY S GEZAHEGN    Enterobacter cloacae complex NOT DETECTED NOT DETECTED Final   Escherichia coli NOT DETECTED NOT DETECTED Final   Klebsiella aerogenes NOT DETECTED NOT DETECTED Final   Klebsiella oxytoca NOT DETECTED NOT DETECTED Final   Klebsiella pneumoniae NOT DETECTED NOT DETECTED Final   Proteus species NOT DETECTED NOT DETECTED Final   Salmonella species NOT DETECTED NOT DETECTED Final   Serratia marcescens NOT DETECTED NOT DETECTED Final   Haemophilus influenzae NOT DETECTED NOT DETECTED Final   Neisseria meningitidis NOT DETECTED NOT DETECTED Final   Pseudomonas aeruginosa NOT DETECTED NOT DETECTED Final   Stenotrophomonas maltophilia NOT DETECTED NOT DETECTED Final   Candida albicans NOT DETECTED NOT DETECTED Final   Candida auris NOT DETECTED NOT DETECTED Final   Candida glabrata NOT DETECTED NOT DETECTED Final   Candida krusei NOT DETECTED NOT DETECTED Final   Candida parapsilosis NOT DETECTED NOT DETECTED Final   Candida tropicalis NOT DETECTED NOT DETECTED Final   Cryptococcus neoformans/gattii NOT DETECTED NOT DETECTED Final   CTX-M ESBL NOT DETECTED NOT DETECTED Final   Carbapenem resistance IMP NOT DETECTED NOT DETECTED Final   Carbapenem resistance KPC NOT DETECTED NOT DETECTED Final   Carbapenem resistance NDM NOT DETECTED NOT DETECTED Final   Carbapenem resist OXA 48 LIKE NOT DETECTED NOT DETECTED  Final   Carbapenem resistance VIM NOT DETECTED NOT DETECTED Final    Comment: Performed at [facility], [address], [place], [place] [address]  Culture, blood (Routine x 2)     Status: None (Preliminary result)   Collection Time: 01/04/20  2:12 AM   Specimen: BLOOD LEFT HAND  Result Value Ref Range Status   Specimen Description   Final    BLOOD LEFT HAND Performed at [facility] [address], [place], [place] [address]    Special Requests   Final    BOTTLES DRAWN AEROBIC AND ANAEROBIC Blood Culture results may not be optimal due to an inadequate volume of blood received in culture bottles Performed at [facility] [address], [place], [place] [address]    Culture  Setup Time   Final    GRAM NEGATIVE RODS IN BOTH AEROBIC AND ANAEROBIC BOTTLES CRITICAL VALUE NOTED.  VALUE IS CONSISTENT WITH PREVIOUSLY REPORTED AND CALLED VALUE. Performed at [facility], [place] [address] [place], [place] [address]    Culture GRAM NEGATIVE RODS  Final   Report Status PENDING  Incomplete  MRSA PCR Screening     Status: Abnormal   Collection Time: 01/04/20  9:53 AM   Specimen: Nasal Mucosa; Nasopharyngeal  Result Value Ref Range Status   MRSA by PCR POSITIVE (A) NEGATIVE Final    Comment:        The GeneXpert MRSA Assay (FDA approved for NASAL specimens only), is one component of a comprehensive MRSA colonization surveillance program. It is not intended to diagnose MRSA infection nor to guide or monitor treatment for MRSA infections. RESULT CALLED TO, READ BACK BY AND VERIFIED WITH: HOUSE,J. RN AT 1225 01/04/20 MULLINS,T Performed at [facility], [place] [address], [place], [place] [address]          Radiology Studies: DG Abd 1 View  Result Date: 01/05/2020 CLINICAL DATA:  Evaluate NG tube placement. EXAM: ABDOMEN - 1 VIEW COMPARISON:  01/04/2020 FINDINGS: NG tube tip and side port are both well below the  level of the GE junction. The tip of the NG tube projects  over the expected location of the distal stomach. Large stool burden identified within the colon with gaseous distension at the level of the splenic flexure. IMPRESSION: Satisfactory position of NG tube with tip projecting over the expected location of distal stomach. Electronically Signed   By: Kerby Moors M.D.   On: 01/05/2020 05:41   DG Abd 1 View  Result Date: 01/04/2020 CLINICAL DATA:  Vomiting, urosepsis EXAM: ABDOMEN - 1 VIEW COMPARISON:  None. FINDINGS: Nonobstructive bowel gas pattern. Mild gaseous distension of the stomach. No gross free intraperitoneal gas. No organomegaly. Vascular calcifications noted within the pelvis. Right hip ORIF has been performed. Possible tiny right pleural effusion. IMPRESSION: 1. Mild gaseous distension of the stomach. Nonobstructive bowel gas pattern. Electronically Signed   By: Fidela Salisbury MD   On: 01/04/2020 18:57   US RENAL  Result Date: 01/04/2020 CLINICAL DATA:  Acute renal insufficiency. Chronic Foley catheter placement. EXAM: RENAL / URINARY TRACT ULTRASOUND COMPLETE COMPARISON:  September 29, 2019 FINDINGS: Right Kidney: Renal measurements: 12.1 x 4.7 x 5.3 cm = volume: 157 mL. Echogenicity within normal limits. No mass or hydronephrosis visualized. Left Kidney: Renal measurements: 12.7 x 4.6 x 5.6 cm = volume: 171 mL. Echogenicity within normal limits. No mass or hydronephrosis visualized. Bladder: Decompressed with a Foley catheter. Possible thickened bladder wall consistent with history of neurogenic bladder. Other: None. IMPRESSION: 1. The kidneys are normal in appearance. 2. The bladder is decompressed with a Foley catheter limiting evaluation. Possible bladder wall thickening is again seen consistent with history. Electronically Signed   By: Dorise Bullion III M.D   On: 01/04/2020 18:54   DG Chest Port 1 View  Result Date: 01/04/2020 CLINICAL DATA:  Lethargy, tachycardia EXAM: PORTABLE CHEST  1 VIEW COMPARISON:  09/29/2019 FINDINGS: The heart size and mediastinal contours are within normal limits. Both lungs are clear. The visualized skeletal structures are unremarkable. IMPRESSION: No active disease. Electronically Signed   By: Randa Ngo M.D.   On: 01/04/2020 01:47   DG Abd Portable 1V  Result Date: 01/04/2020 CLINICAL DATA:  Nasogastric tube placement EXAM: PORTABLE ABDOMEN - 1 VIEW COMPARISON:  4:56 p.m. FINDINGS: Nasogastric tube is been placed with its tip overlying the gastric antrum. The stomach has been largely decompressed. No other changes are seen. IMPRESSION: Nasogastric tube tip within the gastric antrum. Electronically Signed   By: Fidela Salisbury MD   On: 01/04/2020 19:00        Scheduled Meds: . bisacodyl  10 mg Rectal Daily  . chlorhexidine  15 mL Mouth Rinse BID  . Chlorhexidine Gluconate Cloth  6 each Topical Daily  . heparin  5,000 Units Subcutaneous Q8H  . mouth rinse  15 mL Mouth Rinse q12n4p  . mometasone-formoterol  2 puff Inhalation BID  . mupirocin ointment  1 application Nasal BID  . OLANZapine  10 mg Per Tube BID  . pantoprazole (PROTONIX) IV  40 mg Intravenous Q24H  . risperiDONE  2 mg Per Tube Daily   Continuous Infusions: . ceFEPime (MAXIPIME) IV    . lactated ringers 150 mL/hr at 01/05/20 0900  . valproate sodium Stopped (01/05/20 0955)  . vancomycin       LOS: 1 day    Time spent: 40 minutes    Irine Seal, MD [facility]   To contact the attending provider between 7A-7P or the covering provider during after hours 7P-7A, please log into the web site [email or web address] and access using universal [place] password for that web site.  If you do not have the password, please call the hospital operator.  01/05/2020, 10:55 AM

## 2020-01-05 NOTE — Progress Notes (Signed)
Pharmacy Antibiotic Note  Lee House is a 55 y.o. male admitted on 01/04/2020 with urosepsis, AKI from obstructive uropathy.  History significant for neurogenic bladder with chronic Foley catheter, recurrent Pseudomonas and Enterobacter UTI.  Pharmacy has been consulted for Vancomycin and Cefepime dosing. Day #2 antibiotics: Blood cultures with GNR Tm 101.4, WBC dec to wnl SCr down to 0.94  LA 3.3 > 1.9  Plan: Cefepime 2g IV q8h Vancomycin 1g IV q8h. Follow up renal function, culture results, and clinical course.   Height: 6\' 1"  (185.4 cm) Weight: 82.7 kg (182 lb 5.1 oz) IBW/kg (Calculated) : 79.9  Temp (24hrs), Avg:99.6 F (37.6 C), Min:98.5 F (36.9 C), Max:101.4 F (38.6 C)  Recent Labs  Lab 01/04/20 0108 01/04/20 0147 01/04/20 0445 01/04/20 0541 01/04/20 0928 01/04/20 1242 01/04/20 1928 01/05/20 0314  WBC 13.8*  --   --   --   --   --   --  7.5  CREATININE 3.53*  --   --  2.26*  --   --   --  0.94  LATICACIDVEN  --    < > 3.3*  --  2.7* 3.7* 3.4* 1.9   < > = values in this interval not displayed.    Estimated Creatinine Clearance: 100.3 mL/min (by C-G formula based on SCr of 0.94 mg/dL).    Allergies  Allergen Reactions   Penicillins Anaphylaxis    Can take cephalosporins    Antimicrobials this admission: 9/18 Cefepime >> 9/18 Vancomycin >>   Dose adjustments this admission: 9/19 cefepime and vancomycin renally adjusted  Microbiology results: 9/18 COVID: neg 9/18 UCx: 9/18 BCx:GNR 9/18 BCID: Enterobacterales 9/18 MRSA PCR: positive   Thank you for allowing pharmacy to be a part of this patients care.  Gretta Arab PharmD, BCPS Clinical Pharmacist [facility] main pharmacy [phone] 01/05/2020 8:29 AM

## 2020-01-05 NOTE — Progress Notes (Signed)
SLP Cancellation Note  Patient Details Name: Lee House MRN: [id number] DOB: 1965/12/05   Cancelled treatment:       Reason Eval/Treat Not Completed: Medical issues which prohibited therapy. NG tube in place for suctioning. Will f/u 9/20.   Gabriel Rainwater MA, CCC-SLP     Makyia Erxleben Meryl 01/05/2020, 10:13 AM

## 2020-01-05 NOTE — Progress Notes (Signed)
Called and updated sister, Stanton Kidney about patient's status. Sister stated that patient cannot always get details correct and gets confused easily, has removed his own foley catheter about 5 times d/t hallucinations. She states he is a resident at [facility] and the family would like him to return there.   Stanton Kidney also relays that his guardian, Berton Mount is very hard of hearing with memory issues. Clint's wife Holley Raring has taken over - her number is in the chart.   There are also two family contacts [facility] that are medical personnel [phone] and [phone]. Mary requested I add these to his chart.  Advised Mary that [facility] would be point of contact for patient.

## 2020-01-06 ENCOUNTER — Encounter (HOSPITAL_COMMUNITY): Payer: Self-pay | Admitting: Pulmonary Disease

## 2020-01-06 ENCOUNTER — Inpatient Hospital Stay (HOSPITAL_COMMUNITY): Payer: 59

## 2020-01-06 DIAGNOSIS — R7881 Bacteremia: Secondary | ICD-10-CM

## 2020-01-06 DIAGNOSIS — N179 Acute kidney failure, unspecified: Secondary | ICD-10-CM

## 2020-01-06 DIAGNOSIS — K567 Ileus, unspecified: Secondary | ICD-10-CM

## 2020-01-06 DIAGNOSIS — R6520 Severe sepsis without septic shock: Secondary | ICD-10-CM

## 2020-01-06 LAB — CULTURE, BLOOD (ROUTINE X 2)

## 2020-01-06 LAB — CBC WITH DIFFERENTIAL/PLATELET
Abs Immature Granulocytes: 0.02 10*3/uL (ref 0.00–0.07)
Basophils Absolute: 0 10*3/uL (ref 0.0–0.1)
Basophils Relative: 0 %
Eosinophils Absolute: 0.1 10*3/uL (ref 0.0–0.5)
Eosinophils Relative: 1 %
HCT: 36.2 % — ABNORMAL LOW (ref 39.0–52.0)
Hemoglobin: 11.4 g/dL — ABNORMAL LOW (ref 13.0–17.0)
Immature Granulocytes: 0 %
Lymphocytes Relative: 6 %
Lymphs Abs: 0.5 10*3/uL — ABNORMAL LOW (ref 0.7–4.0)
MCH: 27.1 pg (ref 26.0–34.0)
MCHC: 31.5 g/dL (ref 30.0–36.0)
MCV: 86 fL (ref 80.0–100.0)
Monocytes Absolute: 0.4 10*3/uL (ref 0.1–1.0)
Monocytes Relative: 5 %
Neutro Abs: 6.9 10*3/uL (ref 1.7–7.7)
Neutrophils Relative %: 88 %
Platelets: 108 10*3/uL — ABNORMAL LOW (ref 150–400)
RBC: 4.21 MIL/uL — ABNORMAL LOW (ref 4.22–5.81)
RDW: 16 % — ABNORMAL HIGH (ref 11.5–15.5)
WBC: 7.8 10*3/uL (ref 4.0–10.5)
nRBC: 0 % (ref 0.0–0.2)

## 2020-01-06 LAB — RENAL FUNCTION PANEL
Albumin: 2.2 g/dL — ABNORMAL LOW (ref 3.5–5.0)
Anion gap: 9 (ref 5–15)
BUN: 17 mg/dL (ref 6–20)
CO2: 21 mmol/L — ABNORMAL LOW (ref 22–32)
Calcium: 7.7 mg/dL — ABNORMAL LOW (ref 8.9–10.3)
Chloride: 105 mmol/L (ref 98–111)
Creatinine, Ser: 0.65 mg/dL (ref 0.61–1.24)
GFR calc Af Amer: >60 mL/min (ref >60)
GFR calc non Af Amer: >60 mL/min (ref >60)
Glucose, Bld: 99 mg/dL (ref 70–99)
Phosphorus: 2.8 mg/dL (ref 2.5–4.6)
Potassium: 3.6 mmol/L (ref 3.5–5.1)
Sodium: 135 mmol/L (ref 135–145)

## 2020-01-06 LAB — MAGNESIUM: Magnesium: 2 mg/dL (ref 1.7–2.4)

## 2020-01-06 MED ORDER — CHLORHEXIDINE GLUCONATE 0.12 % MT SOLN
15.0000 mL | Freq: Two times a day (BID) | OROMUCOSAL | Status: DC
  Administered 2020-01-06 – 2020-01-10 (×9): 15 mL via OROMUCOSAL
  Filled 2020-01-06 (×9): qty 15

## 2020-01-06 MED ORDER — ORAL CARE MOUTH RINSE
15.0000 mL | Freq: Two times a day (BID) | OROMUCOSAL | Status: DC
  Administered 2020-01-06 – 2020-01-10 (×8): 15 mL via OROMUCOSAL

## 2020-01-06 NOTE — TOC Initial Note (Addendum)
Transition of Care [facility]) - Initial/Assessment Note    Patient Details  Name: Lee House MRN: [id number] Date of Birth: Jun 29, 1965  [facility] CM/SW Contact:    Leeroy Cha, RN Phone Number: 01/06/2020, 10:46 AM  Clinical Narrative:                 Sepsis, urine culture with GNR and bld cultures x2 with GNR and Providencia Rettgeri, iv maxipime,iv lr at 125cc/hr, hx of neurogenic bladder with chronic indwelling foley.  Ielus ng tube to suction and npo Following for progression and toc need-HHC for better foley care. Patient has been managing at home family is leaning toward snf placement due to condition.patient is confused at this time. Expected Discharge Plan: [place] Barriers to Discharge: Continued Medical Work up   Patient Goals and CMS Choice  CMS Medicare.gov Compare Post Acute Care list provided to:: Patient Choice offered to / list presented to : Patient  Expected Discharge Plan and Services Expected Discharge Plan: [place]   Discharge Planning Services: CM Consult   Living arrangements for the past 2 months: Single Family Home                                      Prior Living Arrangements/Services Living arrangements for the past 2 months: Single Family Home Lives with:: Self Patient language and need for interpreter reviewed:: Yes Do you feel safe going back to the place where you live?: Yes      Need for Family Participation in Patient Care: Yes (Comment) Care giver support system in place?: Yes (comment)   Criminal Activity/Legal Involvement Pertinent to Current Situation/Hospitalization: No - Comment as needed  Activities of Daily Living      Permission Sought/Granted                  Emotional Assessment Appearance:: Appears stated age Attitude/Demeanor/Rapport: Engaged Affect (typically observed): Calm Alcohol / Substance Use: Not Applicable Psych Involvement: No  (comment)  Admission diagnosis:  Dehydration [E86.0] Acute cystitis with hematuria [N30.01] AKI (acute kidney injury) ([place]) [N17.9] Sepsis secondary to UTI ([place]) [A41.9, N39.0] Sepsis ([place]) [A41.9] Patient Active Problem List   Diagnosis Date Noted  . Bacteremia due to Gram-negative bacteria 01/05/2020  . Acute lower UTI 01/05/2020  . Acute renal failure ([place])   . Nausea & vomiting   . Ileus ([place])   . Bipolar affective disorder, current episode depressed ([place])   . Chronic obstructive pulmonary disease ([place])   . Obstructive uropathy   . Sepsis secondary to UTI ([place]) 01/04/2020  . Sepsis ([place]) 09/30/2019  . Catheter-associated urinary tract infection ([place]) 09/29/2019  . Neurogenic bladder 09/29/2019  . AMS (altered mental status) 09/09/2019  . Lower urinary tract infectious disease 09/09/2019  . Schizophrenia ([place]) 09/09/2019  . COPD with acute bronchitis ([place]) 09/09/2019  . Distal radial fracture 02/27/2012  . Anemia 06/02/2011  . Cellulitis and abscess of leg 06/01/2011  . Altered mental status 06/01/2011  . Thrombocytopenia ([place]) 06/01/2011   PCP:  Patient, [name] Per Pharmacy:   [facility], [place] [address] Phone: [phone] Fax: [phone]     Social Determinants of Health (SDOH) Interventions    Readmission Risk Interventions Readmission Risk Prevention Plan 09/12/2019  Post Dischage Appt Complete  Medication Screening Complete  Transportation Screening Complete  Some recent data might be hidden

## 2020-01-06 NOTE — Evaluation (Signed)
SLP Cancellation Note  Patient Details Name: Ermin Parisien MRN: [id number] DOB: 11-07-1965   Cancelled treatment:       Reason Eval/Treat Not Completed: Other (comment) (patient still with NG hooked up to suction due to ileus, will continue efforts)  Kathleen Lime, MS [facility] SLP Acute Rehab Services Office [phone]  [name] 01/06/2020, 8:12 AM

## 2020-01-06 NOTE — Progress Notes (Signed)
Spoke with Garnette Gunner (Patient's Uncle) on the phone. He stated that the patient is an established patient of [facility], under the care of Dr. Ila Mcgill.  Dr. Ila Mcgill wants to reach out to the Hospitalist to help coordinate care and plans for discharge per Uncle.

## 2020-01-06 NOTE — Consult Note (Signed)
[facility] for Infectious Disease    Date of Admission:  01/04/2020   Total days of antibiotics: 2 cefepime               Reason for Consult: Bacteremia    Referring Provider: Thompson   Assessment: Recurrent UTI Neurogenic bladder Providencia bacteremia (R- amp, cefazolin)   Plan: 1. Will leave on cefepime.  2. Will repeat BCx 3. Will defer management of his long term foley, recurrent UTI at this point.   Comment-  Per our excellent pharmacists, Provdicencia can make enzymes that would make Ceftriaxone ineffective, so will defer on change from cefepime.  The long term management of the patient and preventing future UTi is extremely difficult given his indwelling foley.   Thank you so much for this interesting consult,  Principal Problem:   Sepsis ([place] [facility]) Active Problems:   Neurogenic bladder   Sepsis secondary to UTI ([place])   Bacteremia due to Gram-negative bacteria   Acute lower UTI   Acute renal failure (HCC)   Nausea & vomiting   Ileus (HCC)   Bipolar affective disorder, current episode depressed ([place])   Chronic obstructive pulmonary disease (H[facility])   Obstructive uropathy   . bisacodyl  10 mg Rectal Daily  . chlorhexidine  15 mL Mouth Rinse BID  . Chlorhexidine Gluconate Cloth  6 each Topical Daily  . heparin  5,000 Units Subcutaneous Q8H  . mouth rinse  15 mL Mouth Rinse q12n4p  . mometasone-formoterol  2 puff Inhalation BID  . mupirocin ointment  1 application Nasal BID  . OLANZapine  10 mg Per Tube BID  . pantoprazole (PROTONIX) IV  40 mg Intravenous Q24H  . risperiDONE  2 mg Per Tube Daily    HPI: Lee House is a 54 y.o. male with hx of CODD, GERD, schizoaffective d/o, neurogenic bladder, recurrent UTI due to indwelling foleys.  He was adm on 9-18 with hypotension, hypoxia, decreased LOC and temp 101.8. He was started on vanco/cefepime/flagyl. His foley was replaced and he was volume repleted.  He has since been found to have  Providencia rettgeri.  Anbx now narrowed to cefepime alone.   Review of Systems: Review of Systems  Unable to perform ROS: Critical illness    Past Medical History:  Diagnosis Date  . Asthma   . Bipolar 1 disorder ([place])   . COPD (chronic obstructive pulmonary disease) ([place])   . GERD (gastroesophageal reflux disease)   . Schizo affective schizophrenia [facility])     Social History   Tobacco Use  . Smoking status: Former Research scientist (life sciences)  . Smokeless tobacco: Never Used  Substance Use Topics  . Alcohol use: No  . Drug use: No    History reviewed. No pertinent family history.   Medications:  Scheduled: . bisacodyl  10 mg Rectal Daily  . chlorhexidine  15 mL Mouth Rinse BID  . Chlorhexidine Gluconate Cloth  6 each Topical Daily  . heparin  5,000 Units Subcutaneous Q8H  . mouth rinse  15 mL Mouth Rinse q12n4p  . mometasone-formoterol  2 puff Inhalation BID  . mupirocin ointment  1 application Nasal BID  . OLANZapine  10 mg Per Tube BID  . pantoprazole (PROTONIX) IV  40 mg Intravenous Q24H  . risperiDONE  2 mg Per Tube Daily    Abtx:  Anti-infectives (From admission, onward)   Start     Dose/Rate Route Frequency Ordered Stop   01/05/20 1400  vancomycin (VANCOCIN) IVPB  1000 mg/200 mL premix  Status:  Discontinued        1,000 mg 200 mL/hr over 60 Minutes Intravenous Every 8 hours 01/05/20 0839 01/05/20 1057   01/05/20 1200  ceFEPIme (MAXIPIME) 2 g in sodium chloride 0.9 % 100 mL IVPB        2 g 200 mL/hr over 30 Minutes Intravenous Every 8 hours 01/05/20 0828     01/05/20 0500  vancomycin (VANCOCIN) IVPB 1000 mg/200 mL premix  Status:  Discontinued        1,000 mg 200 mL/hr over 60 Minutes Intravenous Every 24 hours 01/04/20 0947 01/05/20 0839   01/04/20 1400  ceFEPIme (MAXIPIME) 2 g in sodium chloride 0.9 % 100 mL IVPB  Status:  Discontinued        2 g 200 mL/hr over 30 Minutes Intravenous Every 12 hours 01/04/20 0846 01/05/20 0828   01/04/20 0145  aztreonam (AZACTAM) 2 g in  sodium chloride 0.9 % 100 mL IVPB  Status:  Discontinued        2 g 200 mL/hr over 30 Minutes Intravenous  Once 01/04/20 0137 01/04/20 0140   01/04/20 0145  metroNIDAZOLE (FLAGYL) IVPB 500 mg        500 mg 100 mL/hr over 60 Minutes Intravenous  Once 01/04/20 0137 01/04/20 0455   01/04/20 0145  vancomycin (VANCOCIN) IVPB 1000 mg/200 mL premix  Status:  Discontinued        1,000 mg 200 mL/hr over 60 Minutes Intravenous  Once 01/04/20 0137 01/04/20 0139   01/04/20 0145  vancomycin (VANCOREADY) IVPB 1500 mg/300 mL        1,500 mg 150 mL/hr over 120 Minutes Intravenous  Once 01/04/20 0139 01/04/20 0530   01/04/20 0145  ceFEPIme (MAXIPIME) 2 g in sodium chloride 0.9 % 100 mL IVPB        2 g 200 mL/hr over 30 Minutes Intravenous  Once 01/04/20 0140 01/04/20 0300        OBJECTIVE: Blood pressure (!) 154/76, pulse (!) 110, temperature 99.5 F (37.5 C), temperature source Oral, resp. rate 19, height 6\' 1"  (1.854 m), weight 83.5 kg, SpO2 (!) 81 %.  Physical Exam Vitals reviewed.  Constitutional:      Appearance: He is ill-appearing.  HENT:     Mouth/Throat:     Mouth: Mucous membranes are moist.     Pharynx: No oropharyngeal exudate.  Cardiovascular:     Rate and Rhythm: Regular rhythm. Tachycardia present.  Pulmonary:     Effort: Pulmonary effort is normal.     Breath sounds: Normal breath sounds.  Abdominal:     General: Bowel sounds are normal. There is no distension.     Palpations: Abdomen is soft.     Tenderness: There is no abdominal tenderness.  Musculoskeletal:     Cervical back: Neck supple.     Right lower leg: Edema present.     Left lower leg: Edema present.     Lab Results Results for orders placed or performed during the hospital encounter of 01/04/20 (from the past 48 hour(s))  Lactic acid, plasma     Status: Abnormal   Collection Time: 01/04/20  7:28 PM  Result Value Ref Range   Lactic Acid, Venous 3.4 (HH) 0.5 - 1.9 mmol/L    Comment: CRITICAL VALUE NOTED.   VALUE IS CONSISTENT WITH PREVIOUSLY REPORTED AND CALLED VALUE. Performed at [facility], [place] [address] [place], [place] [address]   CBC     Status: Abnormal  Collection Time: 01/05/20  3:14 AM  Result Value Ref Range   WBC 7.5 4.0 - 10.5 K/uL   RBC 4.35 4.22 - 5.81 MIL/uL   Hemoglobin 11.9 (L) 13.0 - 17.0 g/dL   HCT 35.9 (L) 39 - 52 %   MCV 82.5 80.0 - 100.0 fL   MCH 27.4 26.0 - 34.0 pg   MCHC 33.1 30.0 - 36.0 g/dL   RDW 16.3 (H) 11.5 - 15.5 %   Platelets 108 (L) 150 - 400 K/uL    Comment: REPEATED TO VERIFY PLATELET COUNT CONFIRMED BY SMEAR SPECIMEN CHECKED FOR CLOTS Immature Platelet Fraction may be clinically indicated, consider ordering this additional test [id number]    nRBC 0.0 0.0 - 0.2 %    Comment: Performed at [facility], [place] [address], [place], [place] [address]  Basic metabolic panel     Status: Abnormal   Collection Time: 01/05/20  3:14 AM  Result Value Ref Range   Sodium 137 135 - 145 mmol/L   Potassium 4.8 3.5 - 5.1 mmol/L   Chloride 107 98 - 111 mmol/L   CO2 23 22 - 32 mmol/L   Glucose, Bld 95 70 - 99 mg/dL    Comment: Glucose reference range applies only to samples taken after fasting for at least 8 hours.   BUN 28 (H) 6 - 20 mg/dL   Creatinine, Ser 0.94 0.61 - 1.24 mg/dL    Comment: DELTA CHECK NOTED   Calcium 7.9 (L) 8.9 - 10.3 mg/dL   GFR calc non Af Amer >60 >60 mL/min   GFR calc Af Amer >60 >60 mL/min   Anion gap 7 5 - 15    Comment: Performed at [facility], [place] [address], [place], [place] [address]  Magnesium     Status: None   Collection Time: 01/05/20  3:14 AM  Result Value Ref Range   Magnesium 1.9 1.7 - 2.4 mg/dL    Comment: Performed at [facility], [place] [address] [place], [place] [address]  Phosphorus     Status: None   Collection Time: 01/05/20  3:14 AM  Result Value Ref Range   Phosphorus 2.6 2.5 - 4.6 mg/dL    Comment: Performed at [facility], [place] [address] [place], [place] [address]  Lactic acid, plasma     Status: None   Collection Time: 01/05/20  3:14 AM  Result Value Ref Range   Lactic Acid, Venous 1.9 0.5 - 1.9 mmol/L    Comment: Performed at [facility], [place] [address] [place], [place] [address]  CBC with Differential/Platelet     Status: Abnormal   Collection Time: 01/06/20  3:12 AM  Result Value Ref Range   WBC 7.8 4.0 - 10.5 K/uL   RBC 4.21 (L) 4.22 - 5.81 MIL/uL   Hemoglobin 11.4 (L) 13.0 - 17.0 g/dL   HCT 36.2 (L) 39 - 52 %   MCV 86.0 80.0 - 100.0 fL   MCH 27.1 26.0 - 34.0 pg   MCHC 31.5 30.0 - 36.0 g/dL   RDW 16.0 (H) 11.5 - 15.5 %   Platelets 108 (L) 150 - 400 K/uL    Comment: Immature Platelet Fraction may be clinically indicated, consider ordering this additional test [id number] CONSISTENT WITH PREVIOUS RESULT    nRBC 0.0 0.0 - 0.2 %   Neutrophils Relative % 88 %   Neutro Abs 6.9 1.7 - 7.7 K/uL   Lymphocytes Relative 6 %  Lymphs Abs 0.5 (L) 0.7 - 4.0 K/uL   Monocytes Relative 5 %   Monocytes Absolute 0.4 0 - 1 K/uL   Eosinophils Relative 1 %   Eosinophils Absolute 0.1 0 - 0 K/uL   Basophils Relative 0 %   Basophils Absolute 0.0 0 - 0 K/uL   Immature Granulocytes 0 %   Abs Immature Granulocytes 0.02 0.00 - 0.07 K/uL    Comment: Performed at [facility], [place] [address] [place], [place] [address]  Renal function panel     Status: Abnormal   Collection Time: 01/06/20  3:12 AM  Result Value Ref Range   Sodium 135 135 - 145 mmol/L   Potassium 3.6 3.5 - 5.1 mmol/L    Comment: DELTA CHECK NOTED   Chloride 105 98 - 111 mmol/L   CO2 21 (L) 22 - 32 mmol/L   Glucose, Bld 99 70 - 99 mg/dL    Comment: Glucose reference range applies only to samples taken after fasting for at least 8 hours.   BUN 17 6 - 20 mg/dL   Creatinine, Ser 0.65 0.61 - 1.24 mg/dL   Calcium 7.7 (L) 8.9 - 10.3 mg/dL   Phosphorus 2.8 2.5 - 4.6 mg/dL   Albumin 2.2 (L) 3.5  - 5.0 g/dL   GFR calc non Af Amer >60 >60 mL/min   GFR calc Af Amer >60 >60 mL/min   Anion gap 9 5 - 15    Comment: Performed at [facility], [place] [address] [place], [place] [address]  Magnesium     Status: None   Collection Time: 01/06/20  3:12 AM  Result Value Ref Range   Magnesium 2.0 1.7 - 2.4 mg/dL    Comment: Performed at [facility], [place] [address] [place], [place] [address]      Component Value Date/Time   SDES  01/04/2020 3500    BLOOD LEFT HAND Performed at [facility], [place] [address] [place], [place] [address]    [place]  01/04/2020 0212    BOTTLES DRAWN AEROBIC AND ANAEROBIC Blood Culture results may not be optimal due to an inadequate volume of blood received in culture bottles Performed at [facility], [place] [address], [place], [place] [address]    CULT (A) 01/04/2020 1696    PROVIDENCIA RETTGERI SUSCEPTIBILITIES PERFORMED ON PREVIOUS CULTURE WITHIN THE LAST 5 DAYS. Performed at [facility], [place] [address] [place],  [address]    REPTSTATUS 01/06/2020 FINAL 01/04/2020 0212   DG Abd 1 View  Result Date: 01/06/2020 CLINICAL DATA:  Recent bowel obstruction EXAM: ABDOMEN - 1 VIEW COMPARISON:  January 05, 2020 FINDINGS: Nasogastric tube tip is at the level of the first portion of the duodenum. There remain loops of dilated bowel in the mid abdomen without air-fluid levels. No free air. There is fairly diffuse stool in the colon. Postoperative change noted in the right proximal femur. IMPRESSION: Borderline dilatation of bowel in the mid abdomen potentially could represent a degree of ileus or enteritis. Bowel obstruction appear somewhat less likely. No free air. Fairly diffuse stool in colon. Electronically Signed   By: Lowella Grip III M.D.   On: 01/06/2020 09:09   DG Abd 1 View  Result Date: 01/05/2020 CLINICAL DATA:  Check nasogastric catheter placement EXAM:  ABDOMEN - 1 VIEW COMPARISON:  Film from earlier in the same day. FINDINGS: Gastric catheter is coiled within the stomach in a similar position to that seen  on earlier. Scattered large and small bowel gas is noted. No free air is seen. IMPRESSION: Gastric catheter coiled within the stomach. Electronically Signed   By: Inez Catalina M.D.   On: 01/05/2020 21:20   DG Abd 1 View  Result Date: 01/05/2020 CLINICAL DATA:  Evaluate NG tube placement. EXAM: ABDOMEN - 1 VIEW COMPARISON:  01/04/2020 FINDINGS: NG tube tip and side port are both well below the level of the GE junction. The tip of the NG tube projects over the expected location of the distal stomach. Large stool burden identified within the colon with gaseous distension at the level of the splenic flexure. IMPRESSION: Satisfactory position of NG tube with tip projecting over the expected location of distal stomach. Electronically Signed   By: Kerby Moors M.D.   On: 01/05/2020 05:41   DG Abd 1 View  Result Date: 01/04/2020 CLINICAL DATA:  Vomiting, urosepsis EXAM: ABDOMEN - 1 VIEW COMPARISON:  None. FINDINGS: Nonobstructive bowel gas pattern. Mild gaseous distension of the stomach. No gross free intraperitoneal gas. No organomegaly. Vascular calcifications noted within the pelvis. Right hip ORIF has been performed. Possible tiny right pleural effusion. IMPRESSION: 1. Mild gaseous distension of the stomach. Nonobstructive bowel gas pattern. Electronically Signed   By: Fidela Salisbury MD   On: 01/04/2020 18:57   US RENAL  Result Date: 01/04/2020 CLINICAL DATA:  Acute renal insufficiency. Chronic Foley catheter placement. EXAM: RENAL / URINARY TRACT ULTRASOUND COMPLETE COMPARISON:  September 29, 2019 FINDINGS: Right Kidney: Renal measurements: 12.1 x 4.7 x 5.3 cm = volume: 157 mL. Echogenicity within normal limits. No mass or hydronephrosis visualized. Left Kidney: Renal measurements: 12.7 x 4.6 x 5.6 cm = volume: 171 mL. Echogenicity within normal limits. No  mass or hydronephrosis visualized. Bladder: Decompressed with a Foley catheter. Possible thickened bladder wall consistent with history of neurogenic bladder. Other: None. IMPRESSION: 1. The kidneys are normal in appearance. 2. The bladder is decompressed with a Foley catheter limiting evaluation. Possible bladder wall thickening is again seen consistent with history. Electronically Signed   By: Dorise Bullion III M.D   On: 01/04/2020 18:54   DG Abd Portable 1V  Result Date: 01/04/2020 CLINICAL DATA:  Nasogastric tube placement EXAM: PORTABLE ABDOMEN - 1 VIEW COMPARISON:  4:56 p.m. FINDINGS: Nasogastric tube is been placed with its tip overlying the gastric antrum. The stomach has been largely decompressed. No other changes are seen. IMPRESSION: Nasogastric tube tip within the gastric antrum. Electronically Signed   By: Fidela Salisbury MD   On: 01/04/2020 19:00   Recent Results (from the past 240 hour(s))  Culture, blood (Routine x 2)     Status: Abnormal   Collection Time: 01/04/20  2:06 AM   Specimen: BLOOD RIGHT HAND  Result Value Ref Range Status   Specimen Description   Final    BLOOD RIGHT HAND Performed at [place] [address] [place], [place] [address]    Special Requests   Final    BOTTLES DRAWN AEROBIC AND ANAEROBIC Blood Culture results may not be optimal due to an inadequate volume of blood received in culture bottles Performed at [place] [address], [place], [place] [address]    Culture  Setup Time   Final    GRAM NEGATIVE RODS IN BOTH AEROBIC AND ANAEROBIC BOTTLES Organism ID to follow CRITICAL RESULT CALLED TO, READ BACK BY AND VERIFIED WITH: PHRMD E JACKSON @2048  01/04/20 BY S GEZAHEGN Performed at [facility],  [address], [place], [place] [address]    Culture PROVIDENCIA RETTGERI (A)  Final   Report Status 01/06/2020 FINAL  Final   Organism ID, Bacteria PROVIDENCIA RETTGERI  Final      Susceptibility    Providencia rettgeri - MIC*    AMPICILLIN RESISTANT Resistant     CEFAZOLIN RESISTANT Resistant     CEFEPIME <=0.12 SENSITIVE Sensitive     CEFTAZIDIME <=1 SENSITIVE Sensitive     CEFTRIAXONE <=0.25 SENSITIVE Sensitive     CIPROFLOXACIN <=0.25 SENSITIVE Sensitive     GENTAMICIN <=1 SENSITIVE Sensitive     IMIPENEM 1 SENSITIVE Sensitive     TRIMETH/SULFA <=20 SENSITIVE Sensitive     AMPICILLIN/SULBACTAM <=2 SENSITIVE Sensitive     PIP/TAZO <=4 SENSITIVE Sensitive     * PROVIDENCIA RETTGERI  SARS Coronavirus 2 by RT PCR (hospital order, performed in [place] hospital lab) Nasopharyngeal Nasopharyngeal Swab     Status: None   Collection Time: 01/04/20  2:06 AM   Specimen: Nasopharyngeal Swab  Result Value Ref Range Status   SARS Coronavirus 2 NEGATIVE NEGATIVE Final    Comment: (NOTE) SARS-CoV-2 target nucleic acids are NOT DETECTED.  The SARS-CoV-2 RNA is generally detectable in upper and lower respiratory specimens during the acute phase of infection. The lowest concentration of SARS-CoV-2 viral copies this assay can detect is 250 copies / mL. A negative result does not preclude SARS-CoV-2 infection and should not be used as the sole basis for treatment or other patient management decisions.  A negative result may occur with improper specimen collection / handling, submission of specimen other than nasopharyngeal swab, presence of viral mutation(s) within the areas targeted by this assay, and inadequate number of viral copies (<250 copies / mL). A negative result must be combined with clinical observations, patient history, and epidemiological information.  Fact Sheet for Patients:   StrictlyIdeas.no  Fact Sheet for Healthcare Providers: BankingDealers.co.za  This test is not yet approved or  cleared by the [place] FDA and has been authorized for detection and/or diagnosis of SARS-CoV-2 by FDA under an Emergency Use  Authorization (EUA).  This EUA will remain in effect (meaning this test can be used) for the duration of the COVID-19 declaration under Section 564(b)(1) of the Act, 21 U.S.C. section 360bbb-3(b)(1), unless the authorization is terminated or revoked sooner.  Performed at [facility], [place] [address], [place], [place] [address]   Urine culture     Status: Abnormal (Preliminary result)   Collection Time: 01/04/20  2:06 AM   Specimen: Urine, Random  Result Value Ref Range Status   Specimen Description   Final    URINE, RANDOM Performed at [place] [address], [place], [place] [address]    Special Requests   Final    NONE Performed at [facility], [place] [address] [place], [place] [address]    Culture (A)  Final    >=100,000 COLONIES/mL GRAM NEGATIVE RODS SUSCEPTIBILITIES TO FOLLOW Performed at [facility], [place] [address], [place], [place] [address]    Report Status PENDING  Incomplete  Blood Culture ID Panel (Reflexed)     Status: Abnormal   Collection Time: 01/04/20  2:06 AM  Result Value Ref Range Status   Enterococcus faecalis NOT DETECTED NOT DETECTED Final   Enterococcus Faecium NOT DETECTED NOT DETECTED Final   Listeria monocytogenes NOT DETECTED NOT DETECTED Final   Staphylococcus species NOT DETECTED NOT DETECTED Final   Staphylococcus  aureus (BCID) NOT DETECTED NOT DETECTED Final   Staphylococcus epidermidis NOT DETECTED NOT DETECTED Final   Staphylococcus lugdunensis NOT DETECTED NOT DETECTED Final   Streptococcus species NOT DETECTED NOT DETECTED Final   Streptococcus agalactiae NOT DETECTED NOT DETECTED Final   Streptococcus pneumoniae NOT DETECTED NOT DETECTED Final   Streptococcus pyogenes NOT DETECTED NOT DETECTED Final   A.calcoaceticus-baumannii NOT DETECTED NOT DETECTED Final   Bacteroides fragilis NOT DETECTED NOT DETECTED Final   Enterobacterales DETECTED (A) NOT DETECTED Final     Comment: Enterobacterales represent a large order of gram negative bacteria, not a single organism. Refer to culture for further identification. CRITICAL RESULT CALLED TO, READ BACK BY AND VERIFIED WITH: PHRMD E JACKSON @2048  01/04/20 BY S GEZAHEGN    Enterobacter cloacae complex NOT DETECTED NOT DETECTED Final   Escherichia coli NOT DETECTED NOT DETECTED Final   Klebsiella aerogenes NOT DETECTED NOT DETECTED Final   Klebsiella oxytoca NOT DETECTED NOT DETECTED Final   Klebsiella pneumoniae NOT DETECTED NOT DETECTED Final   Proteus species NOT DETECTED NOT DETECTED Final   Salmonella species NOT DETECTED NOT DETECTED Final   Serratia marcescens NOT DETECTED NOT DETECTED Final   Haemophilus influenzae NOT DETECTED NOT DETECTED Final   Neisseria meningitidis NOT DETECTED NOT DETECTED Final   Pseudomonas aeruginosa NOT DETECTED NOT DETECTED Final   Stenotrophomonas maltophilia NOT DETECTED NOT DETECTED Final   Candida albicans NOT DETECTED NOT DETECTED Final   Candida auris NOT DETECTED NOT DETECTED Final   Candida glabrata NOT DETECTED NOT DETECTED Final   Candida krusei NOT DETECTED NOT DETECTED Final   Candida parapsilosis NOT DETECTED NOT DETECTED Final   Candida tropicalis NOT DETECTED NOT DETECTED Final   Cryptococcus neoformans/gattii NOT DETECTED NOT DETECTED Final   CTX-M ESBL NOT DETECTED NOT DETECTED Final   Carbapenem resistance IMP NOT DETECTED NOT DETECTED Final   Carbapenem resistance KPC NOT DETECTED NOT DETECTED Final   Carbapenem resistance NDM NOT DETECTED NOT DETECTED Final   Carbapenem resist OXA 48 LIKE NOT DETECTED NOT DETECTED Final   Carbapenem resistance VIM NOT DETECTED NOT DETECTED Final    Comment: Performed at [facility], [address], [place], [place] [address]  Culture, blood (Routine x 2)     Status: Abnormal   Collection Time: 01/04/20  2:12 AM   Specimen: BLOOD LEFT HAND  Result Value Ref Range Status   Specimen Description   Final    BLOOD  LEFT HAND Performed at [place] [address], [place], [place] [address]    Special Requests   Final    BOTTLES DRAWN AEROBIC AND ANAEROBIC Blood Culture results may not be optimal due to an inadequate volume of blood received in culture bottles Performed at [place] [address] [place], [place] [address]    Culture  Setup Time   Final    GRAM NEGATIVE RODS IN BOTH AEROBIC AND ANAEROBIC BOTTLES CRITICAL VALUE NOTED.  VALUE IS CONSISTENT WITH PREVIOUSLY REPORTED AND CALLED VALUE.    Culture (A)  Final    PROVIDENCIA RETTGERI SUSCEPTIBILITIES PERFORMED ON PREVIOUS CULTURE WITHIN THE LAST 5 DAYS. Performed at [facility], [place] [address], [place], [place] [address]    Report Status 01/06/2020 FINAL  Final  MRSA PCR Screening     Status: Abnormal   Collection Time: 01/04/20  9:53 AM   Specimen: Nasal Mucosa; Nasopharyngeal  Result Value Ref Range Status   MRSA by PCR POSITIVE (  A) NEGATIVE Final    Comment:        The GeneXpert MRSA Assay (FDA approved for NASAL specimens only), is one component of a comprehensive MRSA colonization surveillance program. It is not intended to diagnose MRSA infection nor to guide or monitor treatment for MRSA infections. RESULT CALLED TO, READ BACK BY AND VERIFIED WITH: HOUSE,J. RN AT 1225 01/04/20 MULLINS,T Performed at [facility], [place] [address], [place], [place] [address]     Microbiology: Recent Results (from the past 240 hour(s))  Culture, blood (Routine x 2)     Status: Abnormal   Collection Time: 01/04/20  2:06 AM   Specimen: BLOOD RIGHT HAND  Result Value Ref Range Status   Specimen Description   Final    BLOOD RIGHT HAND Performed at [place] [address], [place], [place] [address]    Special Requests   Final    BOTTLES DRAWN AEROBIC AND ANAEROBIC Blood Culture results may not be optimal due to an inadequate volume of blood  received in culture bottles Performed at [place] [address] [place], [place] [address]    Culture  Setup Time   Final    GRAM NEGATIVE RODS IN BOTH AEROBIC AND ANAEROBIC BOTTLES Organism ID to follow CRITICAL RESULT CALLED TO, READ BACK BY AND VERIFIED WITH: PHRMD E JACKSON @2048  01/04/20 BY S GEZAHEGN Performed at [facility], [place] [address], [place], [place] [address]    Culture PROVIDENCIA RETTGERI (A)  Final   Report Status 01/06/2020 FINAL  Final   Organism ID, Bacteria PROVIDENCIA RETTGERI  Final      Susceptibility   Providencia rettgeri - MIC*    AMPICILLIN RESISTANT Resistant     CEFAZOLIN RESISTANT Resistant     CEFEPIME <=0.12 SENSITIVE Sensitive     CEFTAZIDIME <=1 SENSITIVE Sensitive     CEFTRIAXONE <=0.25 SENSITIVE Sensitive     CIPROFLOXACIN <=0.25 SENSITIVE Sensitive     GENTAMICIN <=1 SENSITIVE Sensitive     IMIPENEM 1 SENSITIVE Sensitive     TRIMETH/SULFA <=20 SENSITIVE Sensitive     AMPICILLIN/SULBACTAM <=2 SENSITIVE Sensitive     PIP/TAZO <=4 SENSITIVE Sensitive     * PROVIDENCIA RETTGERI  SARS Coronavirus 2 by RT PCR (hospital order, performed in [place] hospital lab) Nasopharyngeal Nasopharyngeal Swab     Status: None   Collection Time: 01/04/20  2:06 AM   Specimen: Nasopharyngeal Swab  Result Value Ref Range Status   SARS Coronavirus 2 NEGATIVE NEGATIVE Final    Comment: (NOTE) SARS-CoV-2 target nucleic acids are NOT DETECTED.  The SARS-CoV-2 RNA is generally detectable in upper and lower respiratory specimens during the acute phase of infection. The lowest concentration of SARS-CoV-2 viral copies this assay can detect is 250 copies / mL. A negative result does not preclude SARS-CoV-2 infection and should not be used as the sole basis for treatment or other patient management decisions.  A negative result may occur with improper specimen collection / handling, submission of specimen other than nasopharyngeal  swab, presence of viral mutation(s) within the areas targeted by this assay, and inadequate number of viral copies (<250 copies / mL). A negative result must be combined with clinical observations, patient history, and epidemiological information.  Fact Sheet for Patients:   StrictlyIdeas.no  Fact Sheet for Healthcare Providers: BankingDealers.co.za  This test is not yet approved or  cleared by the [place] FDA and has been authorized for detection and/or diagnosis of  SARS-CoV-2 by FDA under an Emergency Use Authorization (EUA).  This EUA will remain in effect (meaning this test can be used) for the duration of the COVID-19 declaration under Section 564(b)(1) of the Act, 21 U.S.C. section 360bbb-3(b)(1), unless the authorization is terminated or revoked sooner.  Performed at [facility], [place] [address], [place], [place] [address]   Urine culture     Status: Abnormal (Preliminary result)   Collection Time: 01/04/20  2:06 AM   Specimen: Urine, Random  Result Value Ref Range Status   Specimen Description   Final    URINE, RANDOM Performed at [place] [address], [place], [place] [address]    Special Requests   Final    NONE Performed at [facility], [place] [address], [place], [place] [address]    Culture (A)  Final    >=100,000 COLONIES/mL GRAM NEGATIVE RODS SUSCEPTIBILITIES TO FOLLOW Performed at [facility], [place] [address] [place],  [address]    Report Status PENDING  Incomplete  Blood Culture ID Panel (Reflexed)     Status: Abnormal   Collection Time: 01/04/20  2:06 AM  Result Value Ref Range Status   Enterococcus faecalis NOT DETECTED NOT DETECTED Final   Enterococcus Faecium NOT DETECTED NOT DETECTED Final   Listeria monocytogenes NOT DETECTED NOT DETECTED Final   Staphylococcus species NOT DETECTED NOT DETECTED Final   Staphylococcus  aureus (BCID) NOT DETECTED NOT DETECTED Final   Staphylococcus epidermidis NOT DETECTED NOT DETECTED Final   Staphylococcus lugdunensis NOT DETECTED NOT DETECTED Final   Streptococcus species NOT DETECTED NOT DETECTED Final   Streptococcus agalactiae NOT DETECTED NOT DETECTED Final   Streptococcus pneumoniae NOT DETECTED NOT DETECTED Final   Streptococcus pyogenes NOT DETECTED NOT DETECTED Final   A.calcoaceticus-baumannii NOT DETECTED NOT DETECTED Final   Bacteroides fragilis NOT DETECTED NOT DETECTED Final   Enterobacterales DETECTED (A) NOT DETECTED Final    Comment: Enterobacterales represent a large order of gram negative bacteria, not a single organism. Refer to culture for further identification. CRITICAL RESULT CALLED TO, READ BACK BY AND VERIFIED WITH: PHRMD E JACKSON @2048  01/04/20 BY S GEZAHEGN    Enterobacter cloacae complex NOT DETECTED NOT DETECTED Final   Escherichia coli NOT DETECTED NOT DETECTED Final   Klebsiella aerogenes NOT DETECTED NOT DETECTED Final   Klebsiella oxytoca NOT DETECTED NOT DETECTED Final   Klebsiella pneumoniae NOT DETECTED NOT DETECTED Final   Proteus species NOT DETECTED NOT DETECTED Final   Salmonella species NOT DETECTED NOT DETECTED Final   Serratia marcescens NOT DETECTED NOT DETECTED Final   Haemophilus influenzae NOT DETECTED NOT DETECTED Final   Neisseria meningitidis NOT DETECTED NOT DETECTED Final   Pseudomonas aeruginosa NOT DETECTED NOT DETECTED Final   Stenotrophomonas maltophilia NOT DETECTED NOT DETECTED Final   Candida albicans NOT DETECTED NOT DETECTED Final   Candida auris NOT DETECTED NOT DETECTED Final   Candida glabrata NOT DETECTED NOT DETECTED Final   Candida krusei NOT DETECTED NOT DETECTED Final   Candida parapsilosis NOT DETECTED NOT DETECTED Final   Candida tropicalis NOT DETECTED NOT DETECTED Final   Cryptococcus neoformans/gattii NOT DETECTED NOT DETECTED Final   CTX-M ESBL NOT DETECTED NOT DETECTED Final   Carbapenem  resistance IMP NOT DETECTED NOT DETECTED Final   Carbapenem resistance KPC NOT DETECTED NOT DETECTED Final   Carbapenem resistance NDM NOT DETECTED NOT DETECTED Final   Carbapenem resist OXA 48 LIKE NOT DETECTED NOT DETECTED Final  Carbapenem resistance VIM NOT DETECTED NOT DETECTED Final    Comment: Performed at [facility], [place] [address], [place], [place] [address]  Culture, blood (Routine x 2)     Status: Abnormal   Collection Time: 01/04/20  2:12 AM   Specimen: BLOOD LEFT HAND  Result Value Ref Range Status   Specimen Description   Final    BLOOD LEFT HAND Performed at [facility] [address] [place], [place] [address]    Special Requests   Final    BOTTLES DRAWN AEROBIC AND ANAEROBIC Blood Culture results may not be optimal due to an inadequate volume of blood received in culture bottles Performed at [facility] [address], [place], [place] [address]    Culture  Setup Time   Final    GRAM NEGATIVE RODS IN BOTH AEROBIC AND ANAEROBIC BOTTLES CRITICAL VALUE NOTED.  VALUE IS CONSISTENT WITH PREVIOUSLY REPORTED AND CALLED VALUE.    Culture (A)  Final    PROVIDENCIA RETTGERI SUSCEPTIBILITIES PERFORMED ON PREVIOUS CULTURE WITHIN THE LAST 5 DAYS. Performed at [facility], [place] [address] [place], [place] [address]    Report Status 01/06/2020 FINAL  Final  MRSA PCR Screening     Status: Abnormal   Collection Time: 01/04/20  9:53 AM   Specimen: Nasal Mucosa; Nasopharyngeal  Result Value Ref Range Status   MRSA by PCR POSITIVE (A) NEGATIVE Final    Comment:        The GeneXpert MRSA Assay (FDA approved for NASAL specimens only), is one component of a comprehensive MRSA colonization surveillance program. It is not intended to diagnose MRSA infection nor to guide or monitor treatment for MRSA infections. RESULT CALLED TO, READ BACK BY AND VERIFIED WITH: HOUSE,J. RN AT 1225 01/04/20 MULLINS,T Performed at [facility], [place] [address], [place],  [address]     Radiographs and labs were personally reviewed by me.   Bobby Rumpf, MD [facility] [phone] 01/06/2020, 1:14 PM

## 2020-01-06 NOTE — Progress Notes (Addendum)
PROGRESS NOTE    Lee House  DJM:[id number] DOB: [birth year] DOA: 01/04/2020 PCP: Patient, [name] Per   Chief Complaint  Patient presents with  . Fatigue  . Tachycardia    Brief Narrative:  54 year old with asthma, COPD, GERD, bipolar, schizoaffective disorder, neurogenic bladder with chronic indwelling Foley's, recurrent UTIs with Pseudomonas, Enterobacter  Admitted with urosepsis, obstructive uropathy.  Patient has received 4 lt IV fluids resuscitation and Vanco, cefepime, Flagyl..  Patient was hypoxic initially which resolved.  Chronic Foley's catheter appeared obstructed and was replaced with poor 2 L of urine output.  PCCM consulted for intermittent hypotension and elevated lactic acid.   Assessment & Plan:   Principal Problem:   Sepsis ([place]) Active Problems:   Bacteremia due to Gram-negative bacteria   Acute lower UTI   Neurogenic bladder   Sepsis secondary to UTI ([place])   Acute renal failure (HCC)   Nausea & vomiting   Ileus (HCC)   Bipolar affective disorder, current episode depressed ([place])   Chronic obstructive pulmonary disease (HCC)   Obstructive uropathy  1 sepsis secondary to providencia rettgeri bacteremia and UTI, POA Patient admitted with sepsis met criteria for sepsis with elevated lactic acid level, hypotension, acute renal failure, leukocytosis, fever with a temperature of 101.4, tachycardia, tachypnea.  Patient received 2-1/2 L of IV fluid bolus currently on lactated Ringer's at 150 cc/h with improvement with hypotension.  Decrease IV fluid rate to 125 cc/h.  Blood cultures positive for  providencia rettgeri, urine cultures with greater than 100,000 colonies of gram-negative rods. Fever curve trending down.  Continue empiric IV cefepime.  IV vancomycin was discontinued.  Due to recurrent UTIs, current bacteremia will consult with ID for further evaluation and recommendations.   2.  Acute renal failure secondary to obstructive uropathy Patient had  presented with acute renal failure.  It was felt patient's Foley catheter may have been blocked.  Foley catheter was replaced with improvement in renal function.  Urine output of 1.625 L over the past 24 hours.  Follow.  3.  Neurogenic bladder with chronic Foley's Foley catheter replaced.  Patient with good urine output.  Continue IV fluids.  Follow.  Will likely need outpatient follow-up with urology.  4.  Bipolar disorder/schizophrenia Continue Depakote, Zyprexa, Risperdal, Abilify.  5.  Acute metabolic encephalopathy secondary to sepsis Slowly improving.  Patient more alert today on following commands and answering questions.  Blood cultures with providencia rettgeri.urine cultures with greater than 100,000 colonies of gram-negative rods.  Continue IV cefepime, IV fluids, supportive care.  6.  COPD/asthma Stable.  Chest x-ray on admission was clear.  Titrate O2 as tolerated.  Continue inhalers.  7.  Probable ileus/nausea and emesis Patient noted to have nausea and vomiting on 01/04/2020.  Abdominal films with dilated loops of bowel.  NG tube placed.  Output not recorded.  Patient with flatus.  No bowel movement.  Keep potassium > 4, magnesium >2.  Continue daily Dulcolax suppositories.  Repeat abdominal films pending.  Follow.     DVT prophylaxis: Heparin Code Status: Full Family Communication: Updated patient.  Tried calling Richarda Blade, who was taking over managing patient's guardianship however went to voicemail.   Disposition:   Status is: Inpatient    Dispo: The patient is from: Home              Anticipated d/c is to: To be determined              Anticipated d/c date is: To be  determined              Patient currently septic, on IV antibiotics, concern for possible ileus with NG tube in place, cultures pending.  Not stable for discharge.       Consultants:   PCCM admit  ID pending  Procedures:   Abdominal films 01/04/2020, 01/05/2020  Chest x-ray  01/04/2020  Renal ultrasound 01/04/2020  Micro Data:  Bcx 9/18 > Ucx 9/18 >     Antimicrobials:   IV cefepime 01/04/2020>>>>  IV vancomycin 01/04/2020>>>> 01/05/2020  Flagyl 01/04/2020 x 1 dose   Subjective: Patient laying in bed.  States he does not feel too well today.  Denies any significant shortness of breath.  No chest pain.  No nausea or emesis per RN.  Patient noted with good urine output.  NG tube with output of 925 cc over the past 24 hours.  Abdominal films pending.   Objective: Vitals:   01/06/20 0600 01/06/20 0700 01/06/20 0734 01/06/20 0812  BP: 136/84 130/74    Pulse: 93 94    Resp: 20 18    Temp:   99.5 F (37.5 C)   TempSrc:   Oral   SpO2: 95% 95%  96%  Weight:      Height:        Intake/Output Summary (Last 24 hours) at 01/06/2020 1044 Last data filed at 01/06/2020 0800 Gross per 24 hour  Intake 3365.31 ml  Output 2225 ml  Net 1140.31 ml   Filed Weights   01/04/20 0128 01/05/20 0500 01/06/20 0500  Weight: 77.1 kg 82.7 kg 83.5 kg    Examination:  General exam: Appears calm and comfortable.  NG tube in place with bilious drainage.  Alert.  Respiratory system: Lungs clear to auscultation bilaterally.  No wheezes, no crackles, no rhonchi.  Normal respiratory effort.  Cardiovascular system: Regular rate rhythm no murmurs rubs or gallops.  No JVD.  No lower extremity edema.   Gastrointestinal system: Abdomen is soft, mildly distended, hypoactive bowel sounds, some diffuse tenderness to palpation.  No rebound.  No guarding.   Central nervous system: Alert and oriented. No focal neurological deficits.  Moving extremities spontaneously. Extremities: Symmetric 5 x 5 power. Skin: No rashes, lesions or ulcers Psychiatry: Judgement and insight appear normal. Mood & affect appropriate.     Data Reviewed: I have personally reviewed following labs and imaging studies  CBC: Recent Labs  Lab 01/04/20 0108 01/05/20 0314 01/06/20 0312  WBC 13.8* 7.5 7.8   NEUTROABS 12.1*  --  6.9  HGB 15.0 11.9* 11.4*  HCT 44.6 35.9* 36.2*  MCV 81.8 82.5 86.0  PLT 146* 108* 108*    Basic Metabolic Panel: Recent Labs  Lab 01/04/20 0108 01/04/20 0541 01/05/20 0314 01/06/20 0312  NA 129* 132* 137 135  K 4.6 4.7 4.8 3.6  CL 101 104 107 105  CO2 15* 16* 23 21*  GLUCOSE 121* 113* 95 99  BUN 68* 53* 28* 17  CREATININE 3.53* 2.26* 0.94 0.65  CALCIUM 8.2* 8.2* 7.9* 7.7*  MG  --   --  1.9 2.0  PHOS  --   --  2.6 2.8    GFR: Estimated Creatinine Clearance: 117.9 mL/min (by C-G formula based on SCr of 0.65 mg/dL).  Liver Function Tests: Recent Labs  Lab 01/04/20 0108 01/06/20 0312  AST 29  --   ALT 13  --   ALKPHOS 74  --   BILITOT 0.7  --   PROT 6.7  --  ALBUMIN 3.3* 2.2*    CBG: No results for input(s): GLUCAP in the last 168 hours.   Recent Results (from the past 240 hour(s))  Culture, blood (Routine x 2)     Status: Abnormal   Collection Time: 01/04/20  2:06 AM   Specimen: BLOOD RIGHT HAND  Result Value Ref Range Status   Specimen Description   Final    BLOOD RIGHT HAND Performed at [facility] [address], [place], [place] [address]    Special Requests   Final    BOTTLES DRAWN AEROBIC AND ANAEROBIC Blood Culture results may not be optimal due to an inadequate volume of blood received in culture bottles Performed at [facility] [address] [place], [place] [address]    Culture  Setup Time   Final    GRAM NEGATIVE RODS IN BOTH AEROBIC AND ANAEROBIC BOTTLES Organism ID to follow CRITICAL RESULT CALLED TO, READ BACK BY AND VERIFIED WITH: PHRMD E JACKSON '@[email or web address]'  01/04/20 BY S GEZAHEGN Performed at [facility], [place] [address], [place], [place] [address]    Culture PROVIDENCIA RETTGERI (A)  Final   Report Status 01/06/2020 FINAL  Final   Organism ID, Bacteria PROVIDENCIA RETTGERI  Final      Susceptibility   Providencia rettgeri - MIC*    AMPICILLIN RESISTANT Resistant      CEFAZOLIN RESISTANT Resistant     CEFEPIME <=0.12 SENSITIVE Sensitive     CEFTAZIDIME <=1 SENSITIVE Sensitive     CEFTRIAXONE <=0.25 SENSITIVE Sensitive     CIPROFLOXACIN <=0.25 SENSITIVE Sensitive     GENTAMICIN <=1 SENSITIVE Sensitive     IMIPENEM 1 SENSITIVE Sensitive     TRIMETH/SULFA <=20 SENSITIVE Sensitive     AMPICILLIN/SULBACTAM <=2 SENSITIVE Sensitive     PIP/TAZO <=4 SENSITIVE Sensitive     * PROVIDENCIA RETTGERI  SARS Coronavirus 2 by RT PCR (hospital order, performed in [place] [facility] hospital lab) Nasopharyngeal Nasopharyngeal Swab     Status: None   Collection Time: 01/04/20  2:06 AM   Specimen: Nasopharyngeal Swab  Result Value Ref Range Status   SARS Coronavirus 2 NEGATIVE NEGATIVE Final    Comment: (NOTE) SARS-CoV-2 target nucleic acids are NOT DETECTED.  The SARS-CoV-2 RNA is generally detectable in upper and lower respiratory specimens during the acute phase of infection. The lowest concentration of SARS-CoV-2 viral copies this assay can detect is 250 copies / mL. A negative result does not preclude SARS-CoV-2 infection and should not be used as the sole basis for treatment or other patient management decisions.  A negative result may occur with improper specimen collection / handling, submission of specimen other than nasopharyngeal swab, presence of viral mutation(s) within the areas targeted by this assay, and inadequate number of viral copies (<250 copies / mL). A negative result must be combined with clinical observations, patient history, and epidemiological information.  Fact Sheet for Patients:   StrictlyIdeas.no  Fact Sheet for Healthcare Providers: BankingDealers.co.za  This test is not yet approved or  cleared by the [place] FDA and has been authorized for detection and/or diagnosis of SARS-CoV-2 by FDA under an Emergency Use Authorization (EUA).  This EUA will remain in effect (meaning this test  can be used) for the duration of the COVID-19 declaration under Section 564(b)(1) of the Act, 21 U.S.C. section 360bbb-3(b)(1), unless the authorization is terminated or revoked sooner.  Performed at [facility], [place] [address], [place], [place] [address]   Urine culture  Status: Abnormal (Preliminary result)   Collection Time: 01/04/20  2:06 AM   Specimen: Urine, Random  Result Value Ref Range Status   Specimen Description   Final    URINE, RANDOM Performed at [facility] [address], [place], [place] [address]    Special Requests   Final    NONE Performed at [facility], [place] [address], [place], [place] [address]    Culture (A)  Final    >=100,000 COLONIES/mL GRAM NEGATIVE RODS SUSCEPTIBILITIES TO FOLLOW Performed at [facility], [place] [address] [place], [place] [address]    Report Status PENDING  Incomplete  Blood Culture ID Panel (Reflexed)     Status: Abnormal   Collection Time: 01/04/20  2:06 AM  Result Value Ref Range Status   Enterococcus faecalis NOT DETECTED NOT DETECTED Final   Enterococcus Faecium NOT DETECTED NOT DETECTED Final   Listeria monocytogenes NOT DETECTED NOT DETECTED Final   Staphylococcus species NOT DETECTED NOT DETECTED Final   Staphylococcus aureus (BCID) NOT DETECTED NOT DETECTED Final   Staphylococcus epidermidis NOT DETECTED NOT DETECTED Final   Staphylococcus lugdunensis NOT DETECTED NOT DETECTED Final   Streptococcus species NOT DETECTED NOT DETECTED Final   Streptococcus agalactiae NOT DETECTED NOT DETECTED Final   Streptococcus pneumoniae NOT DETECTED NOT DETECTED Final   Streptococcus pyogenes NOT DETECTED NOT DETECTED Final   A.calcoaceticus-baumannii NOT DETECTED NOT DETECTED Final   Bacteroides fragilis NOT DETECTED NOT DETECTED Final   Enterobacterales DETECTED (A) NOT DETECTED Final    Comment: Enterobacterales represent a large order of gram negative bacteria,  not a single organism. Refer to culture for further identification. CRITICAL RESULT CALLED TO, READ BACK BY AND VERIFIED WITH: PHRMD E JACKSON '@[email or web address]'  01/04/20 BY S GEZAHEGN    Enterobacter cloacae complex NOT DETECTED NOT DETECTED Final   Escherichia coli NOT DETECTED NOT DETECTED Final   Klebsiella aerogenes NOT DETECTED NOT DETECTED Final   Klebsiella oxytoca NOT DETECTED NOT DETECTED Final   Klebsiella pneumoniae NOT DETECTED NOT DETECTED Final   Proteus species NOT DETECTED NOT DETECTED Final   Salmonella species NOT DETECTED NOT DETECTED Final   Serratia marcescens NOT DETECTED NOT DETECTED Final   Haemophilus influenzae NOT DETECTED NOT DETECTED Final   Neisseria meningitidis NOT DETECTED NOT DETECTED Final   Pseudomonas aeruginosa NOT DETECTED NOT DETECTED Final   Stenotrophomonas maltophilia NOT DETECTED NOT DETECTED Final   Candida albicans NOT DETECTED NOT DETECTED Final   Candida auris NOT DETECTED NOT DETECTED Final   Candida glabrata NOT DETECTED NOT DETECTED Final   Candida krusei NOT DETECTED NOT DETECTED Final   Candida parapsilosis NOT DETECTED NOT DETECTED Final   Candida tropicalis NOT DETECTED NOT DETECTED Final   Cryptococcus neoformans/gattii NOT DETECTED NOT DETECTED Final   CTX-M ESBL NOT DETECTED NOT DETECTED Final   Carbapenem resistance IMP NOT DETECTED NOT DETECTED Final   Carbapenem resistance KPC NOT DETECTED NOT DETECTED Final   Carbapenem resistance NDM NOT DETECTED NOT DETECTED Final   Carbapenem resist OXA 48 LIKE NOT DETECTED NOT DETECTED Final   Carbapenem resistance VIM NOT DETECTED NOT DETECTED Final    Comment: Performed at [facility], [address], [place],  [address]  Culture, blood (Routine x 2)     Status: Abnormal   Collection Time: 01/04/20  2:12 AM   Specimen: BLOOD LEFT HAND  Result Value Ref Range Status   Specimen Description   Final    BLOOD LEFT  HAND Performed at [facility], [place] [address] [place], [place] [address]    Special Requests   Final    BOTTLES DRAWN AEROBIC AND ANAEROBIC Blood Culture results may not be optimal due to an inadequate volume of blood received in culture bottles Performed at [facility] [address] [place], [place] [address]    Culture  Setup Time   Final    GRAM NEGATIVE RODS IN BOTH AEROBIC AND ANAEROBIC BOTTLES CRITICAL VALUE NOTED.  VALUE IS CONSISTENT WITH PREVIOUSLY REPORTED AND CALLED VALUE.    Culture (A)  Final    PROVIDENCIA RETTGERI SUSCEPTIBILITIES PERFORMED ON PREVIOUS CULTURE WITHIN THE LAST 5 DAYS. Performed at [facility], [place] [address] [place], [place] [address]    Report Status 01/06/2020 FINAL  Final  MRSA PCR Screening     Status: Abnormal   Collection Time: 01/04/20  9:53 AM   Specimen: Nasal Mucosa; Nasopharyngeal  Result Value Ref Range Status   MRSA by PCR POSITIVE (A) NEGATIVE Final    Comment:        The GeneXpert MRSA Assay (FDA approved for NASAL specimens only), is one component of a comprehensive MRSA colonization surveillance program. It is not intended to diagnose MRSA infection nor to guide or monitor treatment for MRSA infections. RESULT CALLED TO, READ BACK BY AND VERIFIED WITH: HOUSE,J. RN AT 1225 01/04/20 MULLINS,T Performed at [facility], [place] [address], [place],  [address]          Radiology Studies: DG Abd 1 View  Result Date: 01/06/2020 CLINICAL DATA:  Recent bowel obstruction EXAM: ABDOMEN - 1 VIEW COMPARISON:  January 05, 2020 FINDINGS: Nasogastric tube tip is at the level of the first portion of the duodenum. There remain loops of dilated bowel in the mid abdomen without air-fluid levels. No free air. There is fairly diffuse stool in the colon. Postoperative change noted in the right proximal femur. IMPRESSION: Borderline dilatation of bowel in the mid abdomen potentially could represent a degree of ileus or enteritis. Bowel  obstruction appear somewhat less likely. No free air. Fairly diffuse stool in colon. Electronically Signed   By: Lowella Grip III M.D.   On: 01/06/2020 09:09   DG Abd 1 View  Result Date: 01/05/2020 CLINICAL DATA:  Check nasogastric catheter placement EXAM: ABDOMEN - 1 VIEW COMPARISON:  Film from earlier in the same day. FINDINGS: Gastric catheter is coiled within the stomach in a similar position to that seen on earlier. Scattered large and small bowel gas is noted. No free air is seen. IMPRESSION: Gastric catheter coiled within the stomach. Electronically Signed   By: Inez Catalina M.D.   On: 01/05/2020 21:20   DG Abd 1 View  Result Date: 01/05/2020 CLINICAL DATA:  Evaluate NG tube placement. EXAM: ABDOMEN - 1 VIEW COMPARISON:  01/04/2020 FINDINGS: NG tube tip and side port are both well below the level of the GE junction. The tip of the NG tube projects over the expected location of the distal stomach. Large stool burden identified within the colon with gaseous distension at the level of the splenic flexure. IMPRESSION: Satisfactory position of NG tube with tip projecting over the expected location of distal stomach. Electronically Signed   By: Kerby Moors M.D.   On: 01/05/2020 05:41   DG Abd 1 View  Result Date: 01/04/2020 CLINICAL DATA:  Vomiting, urosepsis EXAM: ABDOMEN - 1 VIEW COMPARISON:  None. FINDINGS: Nonobstructive bowel gas pattern.  Mild gaseous distension of the stomach. No gross free intraperitoneal gas. No organomegaly. Vascular calcifications noted within the pelvis. Right hip ORIF has been performed. Possible tiny right pleural effusion. IMPRESSION: 1. Mild gaseous distension of the stomach. Nonobstructive bowel gas pattern. Electronically Signed   By: Fidela [place] MD   On: 01/04/2020 18:57   US RENAL  Result Date: 01/04/2020 CLINICAL DATA:  Acute renal insufficiency. Chronic Foley catheter placement. EXAM: RENAL / URINARY TRACT ULTRASOUND COMPLETE COMPARISON:  September 29, 2019 FINDINGS: Right Kidney: Renal measurements: 12.1 x 4.7 x 5.3 cm = volume: 157 mL. Echogenicity within normal limits. No mass or hydronephrosis visualized. Left Kidney: Renal measurements: 12.7 x 4.6 x 5.6 cm = volume: 171 mL. Echogenicity within normal limits. No mass or hydronephrosis visualized. Bladder: Decompressed with a Foley catheter. Possible thickened bladder wall consistent with history of neurogenic bladder. Other: None. IMPRESSION: 1. The kidneys are normal in appearance. 2. The bladder is decompressed with a Foley catheter limiting evaluation. Possible bladder wall thickening is again seen consistent with history. Electronically Signed   By: Dorise Bullion III M.D   On: 01/04/2020 18:54   DG Abd Portable 1V  Result Date: 01/04/2020 CLINICAL DATA:  Nasogastric tube placement EXAM: PORTABLE ABDOMEN - 1 VIEW COMPARISON:  4:56 p.m. FINDINGS: Nasogastric tube is been placed with its tip overlying the gastric antrum. The stomach has been largely decompressed. No other changes are seen. IMPRESSION: Nasogastric tube tip within the gastric antrum. Electronically Signed   By: Fidela [place] MD   On: 01/04/2020 19:00        Scheduled Meds: . bisacodyl  10 mg Rectal Daily  . chlorhexidine  15 mL Mouth Rinse BID  . Chlorhexidine Gluconate Cloth  6 each Topical Daily  . heparin  5,000 Units Subcutaneous Q8H  . mouth rinse  15 mL Mouth Rinse q12n4p  . mometasone-formoterol  2 puff Inhalation BID  . mupirocin ointment  1 application Nasal BID  . OLANZapine  10 mg Per Tube BID  . pantoprazole (PROTONIX) IV  40 mg Intravenous Q24H  . risperiDONE  2 mg Per Tube Daily   Continuous Infusions: . ceFEPime (MAXIPIME) IV Stopped (01/06/20 5916)  . lactated ringers 125 mL/hr at 01/06/20 0957  . valproate sodium Stopped (01/06/20 0713)     LOS: 2 days    Time spent: 40 minutes    Irine Seal, MD [facility]   To contact the attending provider between 7A-7P or the covering  provider during after hours 7P-7A, please log into the web site [email or web address] and access using universal  password for that web site. If you do not have the password, please call the hospital operator.  01/06/2020, 10:44 AM

## 2020-01-07 ENCOUNTER — Inpatient Hospital Stay (HOSPITAL_COMMUNITY): Payer: 59

## 2020-01-07 DIAGNOSIS — N319 Neuromuscular dysfunction of bladder, unspecified: Secondary | ICD-10-CM

## 2020-01-07 LAB — URINE CULTURE: Culture: >=100000 — AB

## 2020-01-07 LAB — CBC
HCT: 33.1 % — ABNORMAL LOW (ref 39.0–52.0)
Hemoglobin: 10.8 g/dL — ABNORMAL LOW (ref 13.0–17.0)
MCH: 27 pg (ref 26.0–34.0)
MCHC: 32.6 g/dL (ref 30.0–36.0)
MCV: 82.8 fL (ref 80.0–100.0)
Platelets: 114 10*3/uL — ABNORMAL LOW (ref 150–400)
RBC: 4 MIL/uL — ABNORMAL LOW (ref 4.22–5.81)
RDW: 15.8 % — ABNORMAL HIGH (ref 11.5–15.5)
WBC: 9.5 10*3/uL (ref 4.0–10.5)
nRBC: 0 % (ref 0.0–0.2)

## 2020-01-07 LAB — BASIC METABOLIC PANEL
Anion gap: 10 (ref 5–15)
BUN: 15 mg/dL (ref 6–20)
CO2: 22 mmol/L (ref 22–32)
Calcium: 7.5 mg/dL — ABNORMAL LOW (ref 8.9–10.3)
Chloride: 104 mmol/L (ref 98–111)
Creatinine, Ser: 0.6 mg/dL — ABNORMAL LOW (ref 0.61–1.24)
GFR calc Af Amer: >60 mL/min (ref >60)
GFR calc non Af Amer: >60 mL/min (ref >60)
Glucose, Bld: 104 mg/dL — ABNORMAL HIGH (ref 70–99)
Potassium: 3.1 mmol/L — ABNORMAL LOW (ref 3.5–5.1)
Sodium: 136 mmol/L (ref 135–145)

## 2020-01-07 MED ORDER — POTASSIUM CHLORIDE 10 MEQ/100ML IV SOLN
10.0000 meq | INTRAVENOUS | Status: AC
  Administered 2020-01-07 (×5): 10 meq via INTRAVENOUS
  Filled 2020-01-07 (×5): qty 100

## 2020-01-07 NOTE — Evaluation (Signed)
SLP Cancellation Note  Patient Details Name: Lee House MRN: [id number] DOB: 07-04-1965   Cancelled treatment:       Reason Eval/Treat Not Completed: Other (comment) (today pt remains with NG in place, will continue efforts)  Kathleen Lime, MS [facility] SLP Acute Rehab Services Office [phone]  [name] 01/07/2020, 10:19 AM

## 2020-01-07 NOTE — Progress Notes (Signed)
INFECTIOUS DISEASE PROGRESS NOTE  ID: Lee House is a 54 y.o. male with  Principal Problem:   Sepsis ([place]) Active Problems:   Neurogenic bladder   Sepsis secondary to UTI ([place])   Bacteremia due to Gram-negative bacteria   Acute lower UTI   Acute renal failure (HCC)   Nausea & vomiting   Ileus (H[facility])   Bipolar affective disorder, current episode depressed (H[facility])   Chronic obstructive pulmonary disease ([place])   Obstructive uropathy  Subjective: No complaints.   Abtx:  Anti-infectives (From admission, onward)   Start     Dose/Rate Route Frequency Ordered Stop   01/05/20 1400  vancomycin (VANCOCIN) IVPB 1000 mg/200 mL premix  Status:  Discontinued        1,000 mg 200 mL/hr over 60 Minutes Intravenous Every 8 hours 01/05/20 0839 01/05/20 1057   01/05/20 1200  ceFEPIme (MAXIPIME) 2 g in sodium chloride 0.9 % 100 mL IVPB        2 g 200 mL/hr over 30 Minutes Intravenous Every 8 hours 01/05/20 0828     01/05/20 0500  vancomycin (VANCOCIN) IVPB 1000 mg/200 mL premix  Status:  Discontinued        1,000 mg 200 mL/hr over 60 Minutes Intravenous Every 24 hours 01/04/20 0947 01/05/20 0839   01/04/20 1400  ceFEPIme (MAXIPIME) 2 g in sodium chloride 0.9 % 100 mL IVPB  Status:  Discontinued        2 g 200 mL/hr over 30 Minutes Intravenous Every 12 hours 01/04/20 0846 01/05/20 0828   01/04/20 0145  aztreonam (AZACTAM) 2 g in sodium chloride 0.9 % 100 mL IVPB  Status:  Discontinued        2 g 200 mL/hr over 30 Minutes Intravenous  Once 01/04/20 0137 01/04/20 0140   01/04/20 0145  metroNIDAZOLE (FLAGYL) IVPB 500 mg        500 mg 100 mL/hr over 60 Minutes Intravenous  Once 01/04/20 0137 01/04/20 0455   01/04/20 0145  vancomycin (VANCOCIN) IVPB 1000 mg/200 mL premix  Status:  Discontinued        1,000 mg 200 mL/hr over 60 Minutes Intravenous  Once 01/04/20 0137 01/04/20 0139   01/04/20 0145  vancomycin (VANCOREADY) IVPB 1500 mg/300 mL        1,500 mg 150 mL/hr over 120 Minutes  Intravenous  Once 01/04/20 0139 01/04/20 0530   01/04/20 0145  ceFEPIme (MAXIPIME) 2 g in sodium chloride 0.9 % 100 mL IVPB        2 g 200 mL/hr over 30 Minutes Intravenous  Once 01/04/20 0140 01/04/20 0300      Medications:  Scheduled: . bisacodyl  10 mg Rectal Daily  . chlorhexidine  15 mL Mouth Rinse BID  . Chlorhexidine Gluconate Cloth  6 each Topical Daily  . heparin  5,000 Units Subcutaneous Q8H  . mouth rinse  15 mL Mouth Rinse q12n4p  . mometasone-formoterol  2 puff Inhalation BID  . mupirocin ointment  1 application Nasal BID  . OLANZapine  10 mg Per Tube BID  . pantoprazole (PROTONIX) IV  40 mg Intravenous Q24H  . risperiDONE  2 mg Per Tube Daily    Objective: Vital signs in last 24 hours: Temp:  [98.5 F (36.9 C)-100.5 F (38.1 C)] 98.8 F (37.1 C) (09/21 0800) Pulse Rate:  [89-110] 97 (09/21 0700) Resp:  [0-32] 22 (09/21 0700) BP: (132-154)/(69-79) 137/69 (09/21 0401) SpO2:  [91 %-97 %] 95 % (09/21 0816) Weight:  [83.4 kg] 83.4  kg (09/21 0500)   General appearance: alert and mild distress Resp: clear to auscultation bilaterally Cardio: regular rate and rhythm GI: abnormal findings:  absent bowel sounds and distended Extremities: edema trace  Lab Results Recent Labs    01/06/20 0312 01/07/20 0250  WBC 7.8 9.5  HGB 11.4* 10.8*  HCT 36.2* 33.1*  NA 135 136  K 3.6 3.1*  CL 105 104  CO2 21* 22  BUN 17 15  CREATININE 0.65 0.60*   Liver Panel Recent Labs    01/06/20 0312  ALBUMIN 2.2*   Sedimentation Rate No results for input(s): ESRSEDRATE in the last 72 hours. C-Reactive Protein No results for input(s): CRP in the last 72 hours.  Microbiology: Recent Results (from the past 240 hour(s))  Culture, blood (Routine x 2)     Status: Abnormal   Collection Time: 01/04/20  2:06 AM   Specimen: BLOOD RIGHT HAND  Result Value Ref Range Status   Specimen Description   Final    BLOOD RIGHT HAND Performed at [facility], [place]  [address] [place], [place] [address]    Special Requests   Final    BOTTLES DRAWN AEROBIC AND ANAEROBIC Blood Culture results may not be optimal due to an inadequate volume of blood received in culture bottles Performed at [facility] [address], [place], [place] [address]    Culture  Setup Time   Final    GRAM NEGATIVE RODS IN BOTH AEROBIC AND ANAEROBIC BOTTLES Organism ID to follow CRITICAL RESULT CALLED TO, READ BACK BY AND VERIFIED WITH: PHRMD E JACKSON @2048  01/04/20 BY S GEZAHEGN Performed at [facility], [place] [address], [place], [place] [address]    Culture PROVIDENCIA RETTGERI (A)  Final   Report Status 01/06/2020 FINAL  Final   Organism ID, Bacteria PROVIDENCIA RETTGERI  Final      Susceptibility   Providencia rettgeri - MIC*    AMPICILLIN RESISTANT Resistant     CEFAZOLIN RESISTANT Resistant     CEFEPIME <=0.12 SENSITIVE Sensitive     CEFTAZIDIME <=1 SENSITIVE Sensitive     CEFTRIAXONE <=0.25 SENSITIVE Sensitive     CIPROFLOXACIN <=0.25 SENSITIVE Sensitive     GENTAMICIN <=1 SENSITIVE Sensitive     IMIPENEM 1 SENSITIVE Sensitive     TRIMETH/SULFA <=20 SENSITIVE Sensitive     AMPICILLIN/SULBACTAM <=2 SENSITIVE Sensitive     PIP/TAZO <=4 SENSITIVE Sensitive     * PROVIDENCIA RETTGERI  SARS Coronavirus 2 by RT PCR (hospital order, performed in [facility] hospital lab) Nasopharyngeal Nasopharyngeal Swab     Status: None   Collection Time: 01/04/20  2:06 AM   Specimen: Nasopharyngeal Swab  Result Value Ref Range Status   SARS Coronavirus 2 NEGATIVE NEGATIVE Final    Comment: (NOTE) SARS-CoV-2 target nucleic acids are NOT DETECTED.  The SARS-CoV-2 RNA is generally detectable in upper and lower respiratory specimens during the acute phase of infection. The lowest concentration of SARS-CoV-2 viral copies this assay can detect is 250 copies / mL. A negative result does not preclude SARS-CoV-2 infection and should not be used as the sole basis  for treatment or other patient management decisions.  A negative result may occur with improper specimen collection / handling, submission of specimen other than nasopharyngeal swab, presence of viral mutation(s) within the areas targeted by this assay, and inadequate number of viral copies (<250 copies / mL). A negative result must be combined with clinical observations, patient history, and epidemiological information.  Fact Sheet for Patients:   StrictlyIdeas.no  Fact Sheet for Healthcare Providers: BankingDealers.co.za  This test is not yet approved or  cleared by the [place] FDA and has been authorized for detection and/or diagnosis of SARS-CoV-2 by FDA under an Emergency Use Authorization (EUA).  This EUA will remain in effect (meaning this test can be used) for the duration of the COVID-19 declaration under Section 564(b)(1) of the Act, 21 U.S.C. section 360bbb-3(b)(1), unless the authorization is terminated or revoked sooner.  Performed at [facility], [place] [address] [place], [place] [address]   Urine culture     Status: Abnormal   Collection Time: 01/04/20  2:06 AM   Specimen: Urine, Random  Result Value Ref Range Status   Specimen Description   Final    URINE, RANDOM Performed at [facility] [address], [place], [place] [address]    Special Requests   Final    NONE Performed at [facility], [place] [address], [place], [place] [address]    Culture >=100,000 COLONIES/mL PROVIDENCIA RETTGERI (A)  Final   Report Status 01/07/2020 FINAL  Final   Organism ID, Bacteria PROVIDENCIA RETTGERI (A)  Final      Susceptibility   Providencia rettgeri - MIC*    AMPICILLIN RESISTANT Resistant     CEFAZOLIN >=64 RESISTANT Resistant     CEFTRIAXONE <=0.25 SENSITIVE Sensitive     CIPROFLOXACIN <=0.25 SENSITIVE Sensitive     GENTAMICIN <=1 SENSITIVE Sensitive      IMIPENEM 1 SENSITIVE Sensitive     NITROFURANTOIN 256 RESISTANT Resistant     TRIMETH/SULFA <=20 SENSITIVE Sensitive     AMPICILLIN/SULBACTAM 16 INTERMEDIATE Intermediate     PIP/TAZO <=4 SENSITIVE Sensitive     * >=100,000 COLONIES/mL PROVIDENCIA RETTGERI  Blood Culture ID Panel (Reflexed)     Status: Abnormal   Collection Time: 01/04/20  2:06 AM  Result Value Ref Range Status   Enterococcus faecalis NOT DETECTED NOT DETECTED Final   Enterococcus Faecium NOT DETECTED NOT DETECTED Final   Listeria monocytogenes NOT DETECTED NOT DETECTED Final   Staphylococcus species NOT DETECTED NOT DETECTED Final   Staphylococcus aureus (BCID) NOT DETECTED NOT DETECTED Final   Staphylococcus epidermidis NOT DETECTED NOT DETECTED Final   Staphylococcus lugdunensis NOT DETECTED NOT DETECTED Final   Streptococcus species NOT DETECTED NOT DETECTED Final   Streptococcus agalactiae NOT DETECTED NOT DETECTED Final   Streptococcus pneumoniae NOT DETECTED NOT DETECTED Final   Streptococcus pyogenes NOT DETECTED NOT DETECTED Final   A.calcoaceticus-baumannii NOT DETECTED NOT DETECTED Final   Bacteroides fragilis NOT DETECTED NOT DETECTED Final   Enterobacterales DETECTED (A) NOT DETECTED Final    Comment: Enterobacterales represent a large order of gram negative bacteria, not a single organism. Refer to culture for further identification. CRITICAL RESULT CALLED TO, READ BACK BY AND VERIFIED WITH: PHRMD E JACKSON @2048  01/04/20 BY S GEZAHEGN    Enterobacter cloacae complex NOT DETECTED NOT DETECTED Final   Escherichia coli NOT DETECTED NOT DETECTED Final   Klebsiella aerogenes NOT DETECTED NOT DETECTED Final   Klebsiella oxytoca NOT DETECTED NOT DETECTED Final   Klebsiella pneumoniae NOT DETECTED NOT DETECTED Final   Proteus species NOT DETECTED NOT DETECTED Final   Salmonella species NOT DETECTED NOT DETECTED Final   Serratia marcescens NOT DETECTED NOT DETECTED Final   Haemophilus influenzae NOT DETECTED  NOT DETECTED Final   Neisseria meningitidis NOT DETECTED NOT DETECTED Final   Pseudomonas aeruginosa NOT DETECTED NOT DETECTED  Final   Stenotrophomonas maltophilia NOT DETECTED NOT DETECTED Final   Candida albicans NOT DETECTED NOT DETECTED Final   Candida auris NOT DETECTED NOT DETECTED Final   Candida glabrata NOT DETECTED NOT DETECTED Final   Candida krusei NOT DETECTED NOT DETECTED Final   Candida parapsilosis NOT DETECTED NOT DETECTED Final   Candida tropicalis NOT DETECTED NOT DETECTED Final   Cryptococcus neoformans/gattii NOT DETECTED NOT DETECTED Final   CTX-M ESBL NOT DETECTED NOT DETECTED Final   Carbapenem resistance IMP NOT DETECTED NOT DETECTED Final   Carbapenem resistance KPC NOT DETECTED NOT DETECTED Final   Carbapenem resistance NDM NOT DETECTED NOT DETECTED Final   Carbapenem resist OXA 48 LIKE NOT DETECTED NOT DETECTED Final   Carbapenem resistance VIM NOT DETECTED NOT DETECTED Final    Comment: Performed at [facility], [address], [place], [place] [address]  Culture, blood (Routine x 2)     Status: Abnormal   Collection Time: 01/04/20  2:12 AM   Specimen: BLOOD LEFT HAND  Result Value Ref Range Status   Specimen Description   Final    BLOOD LEFT HAND Performed at [facility] [address], [place], [place] [address]    Special Requests   Final    BOTTLES DRAWN AEROBIC AND ANAEROBIC Blood Culture results may not be optimal due to an inadequate volume of blood received in culture bottles Performed at [facility] [address], [place], [place] [address]    Culture  Setup Time   Final    GRAM NEGATIVE RODS IN BOTH AEROBIC AND ANAEROBIC BOTTLES CRITICAL VALUE NOTED.  VALUE IS CONSISTENT WITH PREVIOUSLY REPORTED AND CALLED VALUE.    Culture (A)  Final    PROVIDENCIA RETTGERI SUSCEPTIBILITIES PERFORMED ON PREVIOUS CULTURE WITHIN THE LAST 5 DAYS. Performed at [facility], [place] [address]  [place], [place] [address]    Report Status 01/06/2020 FINAL  Final  MRSA PCR Screening     Status: Abnormal   Collection Time: 01/04/20  9:53 AM   Specimen: Nasal Mucosa; Nasopharyngeal  Result Value Ref Range Status   MRSA by PCR POSITIVE (A) NEGATIVE Final    Comment:        The GeneXpert MRSA Assay (FDA approved for NASAL specimens only), is one component of a comprehensive MRSA colonization surveillance program. It is not intended to diagnose MRSA infection nor to guide or monitor treatment for MRSA infections. RESULT CALLED TO, READ BACK BY AND VERIFIED WITH: HOUSE,J. RN AT 1225 01/04/20 MULLINS,T Performed at [facility], [place] [address], [place], [place] [address]     Studies/Results: DG Abd 1 View  Result Date: 01/06/2020 CLINICAL DATA:  Recent bowel obstruction EXAM: ABDOMEN - 1 VIEW COMPARISON:  January 05, 2020 FINDINGS: Nasogastric tube tip is at the level of the first portion of the duodenum. There remain loops of dilated bowel in the mid abdomen without air-fluid levels. No free air. There is fairly diffuse stool in the colon. Postoperative change noted in the right proximal femur. IMPRESSION: Borderline dilatation of bowel in the mid abdomen potentially could represent a degree of ileus or enteritis. Bowel obstruction appear somewhat less likely. No free air. Fairly diffuse stool in colon. Electronically Signed   By: Lowella Grip III M.D.   On: 01/06/2020 09:09   DG Abd 1 View  Result Date: 01/05/2020 CLINICAL DATA:  Check nasogastric catheter placement EXAM: ABDOMEN - 1 VIEW COMPARISON:  Film from  earlier in the same day. FINDINGS: Gastric catheter is coiled within the stomach in a similar position to that seen on earlier. Scattered large and small bowel gas is noted. No free air is seen. IMPRESSION: Gastric catheter coiled within the stomach. Electronically Signed   By: Inez Catalina M.D.   On: 01/05/2020 21:20   DG Abd 2 Views  Result Date:  01/07/2020 CLINICAL DATA:  Urosepsis EXAM: X-RAY ABDOMEN 2 VIEWS COMPARISON:  01/06/2020 FINDINGS: NG tube remains in the stomach. Gas throughout mildly distended large bowel with moderate stool burden. No small bowel distention to suggest obstruction. No organomegaly, free air or suspicious calcification. IMPRESSION: Moderate stool burden with mild gaseous distention of the colon. NG tube remains in the stomach. Electronically Signed   By: Rolm Baptise M.D.   On: 01/07/2020 08:53     Assessment/Plan: Recurrent UTI Neurogenic bladder Providencia bacteremia (R- amp, cefazolin) Ileus  Total days of antibiotics: 3 cefepime  Temp has improved (100.5 yesterday) as has WBC from adm.  His Cr is stable.  Ileus is stable Would aim to continue cefepime for 1 week.  Bladder mgmt issues.          Bobby Rumpf MD, FACP Infectious Diseases (pager) [phone] [email or web address] 01/07/2020, 11:09 AM  LOS: 3 days

## 2020-01-07 NOTE — Plan of Care (Signed)

## 2020-01-07 NOTE — Progress Notes (Addendum)
PROGRESS NOTE    Lee House  ZJI:[id number] DOB: 1966-01-10 DOA: 01/04/2020 PCP: Patient, [name] Per   Chief Complaint  Patient presents with  . Fatigue  . Tachycardia    Brief Narrative:  54 year old with asthma, COPD, GERD, bipolar, schizoaffective disorder, neurogenic bladder with chronic indwelling Foley's, recurrent UTIs with Pseudomonas, Enterobacter  Admitted with urosepsis, obstructive uropathy.  Patient has received 4 lt IV fluids resuscitation and Vanco, cefepime, Flagyl..  Patient was hypoxic initially which resolved.  Chronic Foley's catheter appeared obstructed and was replaced with poor 2 L of urine output.  PCCM consulted for intermittent hypotension and elevated lactic acid.   Assessment & Plan:   Principal Problem:   Sepsis ([place]) Active Problems:   Bacteremia due to Gram-negative bacteria   Acute lower UTI   Neurogenic bladder   Sepsis secondary to UTI ([place])   Acute renal failure (HCC)   Nausea & vomiting   Ileus (HCC)   Bipolar affective disorder, current episode depressed ([place])   Chronic obstructive pulmonary disease (HCC)   Obstructive uropathy  1 sepsis secondary to providencia rettgeri bacteremia and UTI, POA Patient admitted with sepsis met criteria for sepsis with elevated lactic acid level, hypotension, acute renal failure, leukocytosis, fever with a temperature of 101.4, tachycardia, tachypnea.  Patient received 2-1/2 L of IV fluid bolus currently on lactated Ringer's at 150 cc/h with improvement with hypotension.  Decrease IV fluids to 100 cc/h.  Blood cultures positive for  providencia rettgeri, urine cultures with greater than 100,000 colonies of gram-negative rods. Fever curve trending down.  Continue empiric IV cefepime.  IV vancomycin was discontinued.  Due to recurrent UTIs, current bacteremia ID was consulted who are following.    2.  Acute renal failure secondary to obstructive uropathy Patient had presented with acute renal  failure.  Secondary to obstructive uropathy.  It was felt patient's Foley catheter may have been blocked.  Foley catheter was replaced with improvement in renal function.  Urine output of 1.375 L over the past 24 hours.  Follow.  3.  Neurogenic bladder with chronic Foley's Foley catheter replaced.  Patient with good urine output of 1.375 L over the past 24 hours..  Continue IV fluids.  Follow.  Will likely need outpatient follow-up with urology.  4.  Bipolar disorder/schizophrenia Stable.  Continue Zyprexa, Depakote, Abilify, Risperdal.   5.  Acute metabolic encephalopathy secondary to sepsis Improving slowly.  Patient more alert following commands answering questions. Blood cultures with providencia rettgeri.urine cultures with greater than 100,000 colonies of gram-negative rods.  Continue IV cefepime, IV fluids, supportive care.  6.  COPD/asthma Stable.  Chest x-ray on admission was clear.  Titrate O2 as tolerated.  Continue inhalers.  7.  Probable ileus/nausea and emesis Patient noted to have nausea and vomiting on 01/04/2020.  Abdominal films with dilated loops of bowel.  NG tube placed.  800 cc output over the past 24 hours.  Positive flatus.  Positive bowel movement. Keep potassium > 4, magnesium >2.  Continue daily Dulcolax suppositories.  Repeat abdominal films pending.  Trial of clamping of NG tube.  Trial of clears.  If tolerates clears with clamping of NG tube could possibly discontinue NG tube tomorrow.  Follow.  8.  Hypokalemia Replete.   DVT prophylaxis: Heparin Code Status: Full Family Communication: Updated patient.   Disposition:   Status is: Inpatient    Dispo: The patient is from: Home              Anticipated  d/c is to: To be determined              Anticipated d/c date is: To be determined              Patient currently septic, on IV antibiotics, concern for possible ileus with NG tube in place, cultures pending.  Not stable for discharge.       Consultants:    PCCM admit  ID: Dr. Johnnye Sima 01/06/2020  Procedures:   Abdominal films 01/04/2020, 01/05/2020  Chest x-ray 01/04/2020  Renal ultrasound 01/04/2020  Micro Data:  Bcx 9/18 > Ucx 9/18 >     Antimicrobials:   IV cefepime 01/04/2020>>>>  IV vancomycin 01/04/2020>>>> 01/05/2020  Flagyl 01/04/2020 x 1 dose   Subjective: Patient more alert today.  NG tube in place.  Denies any emesis.  No chest pain.  No shortness of breath.  Positive flatus.  Patient stated had a bowel movement last night.   Objective: Vitals:   01/07/20 0600 01/07/20 0700 01/07/20 0800 01/07/20 0816  BP:      Pulse: 89 97    Resp: (!) 0 (!) 22    Temp:   98.8 F (37.1 C)   TempSrc:   Oral   SpO2: 95% 94%  95%  Weight:      Height:        Intake/Output Summary (Last 24 hours) at 01/07/2020 1004 Last data filed at 01/07/2020 8676 Gross per 24 hour  Intake 2754.52 ml  Output 2050 ml  Net 704.52 ml   Filed Weights   01/05/20 0500 01/06/20 0500 01/07/20 0500  Weight: 82.7 kg 83.5 kg 83.4 kg    Examination:  General exam: Appears calm and comfortable.  NG tube in place with bilious drainage.  Alert.  Respiratory system: CTA B.  No wheezes, no crackles, no rhonchi.  Normal respiratory effort.  Cardiovascular system: RRR no murmurs rubs or gallops.  No JVD.  No lower extremity edema.   Gastrointestinal system: Abdomen is soft, mildly distended, positive bowel sounds, no tenderness to palpation.  No rebound.  No guarding.  Central nervous system: Alert and oriented. No focal neurological deficits.  Moving extremities spontaneously. Extremities: Symmetric 5 x 5 power. Skin: No rashes, lesions or ulcers Psychiatry: Judgement and insight appear normal. Mood & affect appropriate.     Data Reviewed: I have personally reviewed following labs and imaging studies  CBC: Recent Labs  Lab 01/04/20 0108 01/05/20 0314 01/06/20 0312 01/07/20 0250  WBC 13.8* 7.5 7.8 9.5  NEUTROABS 12.1*  --  6.9  --   HGB  15.0 11.9* 11.4* 10.8*  HCT 44.6 35.9* 36.2* 33.1*  MCV 81.8 82.5 86.0 82.8  PLT 146* 108* 108* 114*    Basic Metabolic Panel: Recent Labs  Lab 01/04/20 0108 01/04/20 0541 01/05/20 0314 01/06/20 0312 01/07/20 0250  NA 129* 132* 137 135 136  K 4.6 4.7 4.8 3.6 3.1*  CL 101 104 107 105 104  CO2 15* 16* 23 21* 22  GLUCOSE 121* 113* 95 99 104*  BUN 68* 53* 28* 17 15  CREATININE 3.53* 2.26* 0.94 0.65 0.60*  CALCIUM 8.2* 8.2* 7.9* 7.7* 7.5*  MG  --   --  1.9 2.0  --   PHOS  --   --  2.6 2.8  --     GFR: Estimated Creatinine Clearance: 117.9 mL/min (A) (by C-G formula based on SCr of 0.6 mg/dL (L)).  Liver Function Tests: Recent Labs  Lab 01/04/20 0108 01/06/20 7209  AST 29  --   ALT 13  --   ALKPHOS 74  --   BILITOT 0.7  --   PROT 6.7  --   ALBUMIN 3.3* 2.2*    CBG: No results for input(s): GLUCAP in the last 168 hours.   Recent Results (from the past 240 hour(s))  Culture, blood (Routine x 2)     Status: Abnormal   Collection Time: 01/04/20  2:06 AM   Specimen: BLOOD RIGHT HAND  Result Value Ref Range Status   Specimen Description   Final    BLOOD RIGHT HAND Performed at [facility] [address], [place], [place] [address]    Special Requests   Final    BOTTLES DRAWN AEROBIC AND ANAEROBIC Blood Culture results may not be optimal due to an inadequate volume of blood received in culture bottles Performed at [facility] [address], [place], [place] [address]    Culture  Setup Time   Final    GRAM NEGATIVE RODS IN BOTH AEROBIC AND ANAEROBIC BOTTLES Organism ID to follow CRITICAL RESULT CALLED TO, READ BACK BY AND VERIFIED WITH: PHRMD E JACKSON _0  01/04/20 BY S GEZAHEGN Performed at [facility], [place] [address], [place], [place] [address]    Culture PROVIDENCIA RETTGERI (A)  Final   Report Status 01/06/2020 FINAL  Final   Organism ID, Bacteria PROVIDENCIA RETTGERI  Final      Susceptibility    Providencia rettgeri - MIC*    AMPICILLIN RESISTANT Resistant     CEFAZOLIN RESISTANT Resistant     CEFEPIME <=0.12 SENSITIVE Sensitive     CEFTAZIDIME <=1 SENSITIVE Sensitive     CEFTRIAXONE <=0.25 SENSITIVE Sensitive     CIPROFLOXACIN <=0.25 SENSITIVE Sensitive     GENTAMICIN <=1 SENSITIVE Sensitive     IMIPENEM 1 SENSITIVE Sensitive     TRIMETH/SULFA <=20 SENSITIVE Sensitive     AMPICILLIN/SULBACTAM <=2 SENSITIVE Sensitive     PIP/TAZO <=4 SENSITIVE Sensitive     * PROVIDENCIA RETTGERI  SARS Coronavirus 2 by RT PCR (hospital order, performed in [facility] hospital lab) Nasopharyngeal Nasopharyngeal Swab     Status: None   Collection Time: 01/04/20  2:06 AM   Specimen: Nasopharyngeal Swab  Result Value Ref Range Status   SARS Coronavirus 2 NEGATIVE NEGATIVE Final    Comment: (NOTE) SARS-CoV-2 target nucleic acids are NOT DETECTED.  The SARS-CoV-2 RNA is generally detectable in upper and lower respiratory specimens during the acute phase of infection. The lowest concentration of SARS-CoV-2 viral copies this assay can detect is 250 copies / mL. A negative result does not preclude SARS-CoV-2 infection and should not be used as the sole basis for treatment or other patient management decisions.  A negative result may occur with improper specimen collection / handling, submission of specimen other than nasopharyngeal swab, presence of viral mutation(s) within the areas targeted by this assay, and inadequate number of viral copies (<250 copies / mL). A negative result must be combined with clinical observations, patient history, and epidemiological information.  Fact Sheet for Patients:   StrictlyIdeas.no  Fact Sheet for Healthcare Providers: BankingDealers.co.za  This test is not yet approved or  cleared by the [place] FDA and has been authorized for detection and/or diagnosis of SARS-CoV-2 by FDA under an Emergency Use  Authorization (EUA).  This EUA will remain in effect (meaning this test can be used) for the duration of the COVID-19 declaration under Section 564(b)(1) of the Act,  21 U.S.C. section 360bbb-3(b)(1), unless the authorization is terminated or revoked sooner.  Performed at [facility], [place] [address] [place], [place] [address]   Urine culture     Status: Abnormal   Collection Time: 01/04/20  2:06 AM   Specimen: Urine, Random  Result Value Ref Range Status   Specimen Description   Final    URINE, RANDOM Performed at [facility] [address], [place], [place] [address]    Special Requests   Final    NONE Performed at [facility], [place] [address], [place], [place] [address]    Culture >=100,000 COLONIES/mL PROVIDENCIA RETTGERI (A)  Final   Report Status 01/07/2020 FINAL  Final   Organism ID, Bacteria PROVIDENCIA RETTGERI (A)  Final      Susceptibility   Providencia rettgeri - MIC*    AMPICILLIN RESISTANT Resistant     CEFAZOLIN >=64 RESISTANT Resistant     CEFTRIAXONE <=0.25 SENSITIVE Sensitive     CIPROFLOXACIN <=0.25 SENSITIVE Sensitive     GENTAMICIN <=1 SENSITIVE Sensitive     IMIPENEM 1 SENSITIVE Sensitive     NITROFURANTOIN 256 RESISTANT Resistant     TRIMETH/SULFA <=20 SENSITIVE Sensitive     AMPICILLIN/SULBACTAM 16 INTERMEDIATE Intermediate     PIP/TAZO <=4 SENSITIVE Sensitive     * >=100,000 COLONIES/mL PROVIDENCIA RETTGERI  Blood Culture ID Panel (Reflexed)     Status: Abnormal   Collection Time: 01/04/20  2:06 AM  Result Value Ref Range Status   Enterococcus faecalis NOT DETECTED NOT DETECTED Final   Enterococcus Faecium NOT DETECTED NOT DETECTED Final   Listeria monocytogenes NOT DETECTED NOT DETECTED Final   Staphylococcus species NOT DETECTED NOT DETECTED Final   Staphylococcus aureus (BCID) NOT DETECTED NOT DETECTED Final   Staphylococcus epidermidis NOT DETECTED NOT DETECTED Final   Staphylococcus  lugdunensis NOT DETECTED NOT DETECTED Final   Streptococcus species NOT DETECTED NOT DETECTED Final   Streptococcus agalactiae NOT DETECTED NOT DETECTED Final   Streptococcus pneumoniae NOT DETECTED NOT DETECTED Final   Streptococcus pyogenes NOT DETECTED NOT DETECTED Final   A.calcoaceticus-baumannii NOT DETECTED NOT DETECTED Final   Bacteroides fragilis NOT DETECTED NOT DETECTED Final   Enterobacterales DETECTED (A) NOT DETECTED Final    Comment: Enterobacterales represent a large order of gram negative bacteria, not a single organism. Refer to culture for further identification. CRITICAL RESULT CALLED TO, READ BACK BY AND VERIFIED WITH: PHRMD E JACKSON _0  01/04/20 BY S GEZAHEGN    Enterobacter cloacae complex NOT DETECTED NOT DETECTED Final   Escherichia coli NOT DETECTED NOT DETECTED Final   Klebsiella aerogenes NOT DETECTED NOT DETECTED Final   Klebsiella oxytoca NOT DETECTED NOT DETECTED Final   Klebsiella pneumoniae NOT DETECTED NOT DETECTED Final   Proteus species NOT DETECTED NOT DETECTED Final   Salmonella species NOT DETECTED NOT DETECTED Final   Serratia marcescens NOT DETECTED NOT DETECTED Final   Haemophilus influenzae NOT DETECTED NOT DETECTED Final   Neisseria meningitidis NOT DETECTED NOT DETECTED Final   Pseudomonas aeruginosa NOT DETECTED NOT DETECTED Final   Stenotrophomonas maltophilia NOT DETECTED NOT DETECTED Final   Candida albicans NOT DETECTED NOT DETECTED Final   Candida auris NOT DETECTED NOT DETECTED Final   Candida glabrata NOT DETECTED NOT DETECTED Final   Candida krusei NOT DETECTED NOT DETECTED Final   Candida parapsilosis NOT DETECTED NOT DETECTED Final   Candida tropicalis NOT DETECTED NOT DETECTED Final   Cryptococcus neoformans/gattii NOT DETECTED NOT DETECTED Final  CTX-M ESBL NOT DETECTED NOT DETECTED Final   Carbapenem resistance IMP NOT DETECTED NOT DETECTED Final   Carbapenem resistance KPC NOT DETECTED NOT DETECTED Final   Carbapenem  resistance NDM NOT DETECTED NOT DETECTED Final   Carbapenem resist OXA 48 LIKE NOT DETECTED NOT DETECTED Final   Carbapenem resistance VIM NOT DETECTED NOT DETECTED Final    Comment: Performed at [facility], [place] [address] [place], [place] [address]  Culture, blood (Routine x 2)     Status: Abnormal   Collection Time: 01/04/20  2:12 AM   Specimen: BLOOD LEFT HAND  Result Value Ref Range Status   Specimen Description   Final    BLOOD LEFT HAND Performed at [facility] [address] [place], [place] [address]    Special Requests   Final    BOTTLES DRAWN AEROBIC AND ANAEROBIC Blood Culture results may not be optimal due to an inadequate volume of blood received in culture bottles Performed at [facility] [address], [place], [place] [address]    Culture  Setup Time   Final    GRAM NEGATIVE RODS IN BOTH AEROBIC AND ANAEROBIC BOTTLES CRITICAL VALUE NOTED.  VALUE IS CONSISTENT WITH PREVIOUSLY REPORTED AND CALLED VALUE.    Culture (A)  Final    PROVIDENCIA RETTGERI SUSCEPTIBILITIES PERFORMED ON PREVIOUS CULTURE WITHIN THE LAST 5 DAYS. Performed at [facility], [place] [address] [place], [place] [address]    Report Status 01/06/2020 FINAL  Final  MRSA PCR Screening     Status: Abnormal   Collection Time: 01/04/20  9:53 AM   Specimen: Nasal Mucosa; Nasopharyngeal  Result Value Ref Range Status   MRSA by PCR POSITIVE (A) NEGATIVE Final    Comment:        The GeneXpert MRSA Assay (FDA approved for NASAL specimens only), is one component of a comprehensive MRSA colonization surveillance program. It is not intended to diagnose MRSA infection nor to guide or monitor treatment for MRSA infections. RESULT CALLED TO, READ BACK BY AND VERIFIED WITH: HOUSE,J. RN AT 1225 01/04/20 MULLINS,T Performed at [facility], [place] [address], [place], [place] [address]          Radiology Studies: DG Abd 1  View  Result Date: 01/06/2020 CLINICAL DATA:  Recent bowel obstruction EXAM: ABDOMEN - 1 VIEW COMPARISON:  January 05, 2020 FINDINGS: Nasogastric tube tip is at the level of the first portion of the duodenum. There remain loops of dilated bowel in the mid abdomen without air-fluid levels. No free air. There is fairly diffuse stool in the colon. Postoperative change noted in the right proximal femur. IMPRESSION: Borderline dilatation of bowel in the mid abdomen potentially could represent a degree of ileus or enteritis. Bowel obstruction appear somewhat less likely. No free air. Fairly diffuse stool in colon. Electronically Signed   By: Lowella Grip III M.D.   On: 01/06/2020 09:09   DG Abd 1 View  Result Date: 01/05/2020 CLINICAL DATA:  Check nasogastric catheter placement EXAM: ABDOMEN - 1 VIEW COMPARISON:  Film from earlier in the same day. FINDINGS: Gastric catheter is coiled within the stomach in a similar position to that seen on earlier. Scattered large and small bowel gas is noted. No free air is seen. IMPRESSION: Gastric catheter coiled within the stomach. Electronically Signed   By: Inez Catalina M.D.   On: 01/05/2020 21:20   DG Abd 2 Views  Result Date: 01/07/2020 CLINICAL DATA:  Urosepsis EXAM: X-RAY ABDOMEN 2 VIEWS COMPARISON:  01/06/2020 FINDINGS: NG tube remains in the stomach. Gas throughout mildly distended large bowel with moderate stool burden. No small bowel distention to suggest obstruction. No organomegaly, free air or suspicious calcification. IMPRESSION: Moderate stool burden with mild gaseous distention of the colon. NG tube remains in the stomach. Electronically Signed   By: Rolm Baptise M.D.   On: 01/07/2020 08:53        Scheduled Meds: . bisacodyl  10 mg Rectal Daily  . chlorhexidine  15 mL Mouth Rinse BID  . Chlorhexidine Gluconate Cloth  6 each Topical Daily  . heparin  5,000 Units Subcutaneous Q8H  . mouth rinse  15 mL Mouth Rinse q12n4p  .  mometasone-formoterol  2 puff Inhalation BID  . mupirocin ointment  1 application Nasal BID  . OLANZapine  10 mg Per Tube BID  . pantoprazole (PROTONIX) IV  40 mg Intravenous Q24H  . risperiDONE  2 mg Per Tube Daily   Continuous Infusions: . ceFEPime (MAXIPIME) IV Stopped (01/07/20 0615)  . lactated ringers 125 mL/hr at 01/07/20 0628  . potassium chloride 10 mEq (01/07/20 0902)  . valproate sodium Stopped (01/07/20 0728)     LOS: 3 days    Time spent: 40 minutes    Irine Seal, MD [facility]   To contact the attending provider between 7A-7P or the covering provider during after hours 7P-7A, please log into the web site [email or web address] and access using universal [place] password for that web site. If you do not have the password, please call the hospital operator.  01/07/2020, 10:04 AM

## 2020-01-08 LAB — MAGNESIUM: Magnesium: 2.1 mg/dL (ref 1.7–2.4)

## 2020-01-08 LAB — CBC WITH DIFFERENTIAL/PLATELET
Abs Immature Granulocytes: 0.74 10*3/uL — ABNORMAL HIGH (ref 0.00–0.07)
Basophils Absolute: 0 10*3/uL (ref 0.0–0.1)
Basophils Relative: 0 %
Eosinophils Absolute: 0.1 10*3/uL (ref 0.0–0.5)
Eosinophils Relative: 1 %
HCT: 35.6 % — ABNORMAL LOW (ref 39.0–52.0)
Hemoglobin: 11.8 g/dL — ABNORMAL LOW (ref 13.0–17.0)
Immature Granulocytes: 7 %
Lymphocytes Relative: 9 %
Lymphs Abs: 1 10*3/uL (ref 0.7–4.0)
MCH: 27.4 pg (ref 26.0–34.0)
MCHC: 33.1 g/dL (ref 30.0–36.0)
MCV: 82.8 fL (ref 80.0–100.0)
Monocytes Absolute: 1.6 10*3/uL — ABNORMAL HIGH (ref 0.1–1.0)
Monocytes Relative: 16 %
Neutro Abs: 7 10*3/uL (ref 1.7–7.7)
Neutrophils Relative %: 67 %
Platelets: 140 10*3/uL — ABNORMAL LOW (ref 150–400)
RBC: 4.3 MIL/uL (ref 4.22–5.81)
RDW: 16.3 % — ABNORMAL HIGH (ref 11.5–15.5)
WBC: 10.4 10*3/uL (ref 4.0–10.5)
nRBC: 0 % (ref 0.0–0.2)

## 2020-01-08 LAB — BASIC METABOLIC PANEL
Anion gap: 8 (ref 5–15)
BUN: 11 mg/dL (ref 6–20)
CO2: 23 mmol/L (ref 22–32)
Calcium: 7.4 mg/dL — ABNORMAL LOW (ref 8.9–10.3)
Chloride: 100 mmol/L (ref 98–111)
Creatinine, Ser: 0.62 mg/dL (ref 0.61–1.24)
GFR calc Af Amer: >60 mL/min (ref >60)
GFR calc non Af Amer: >60 mL/min (ref >60)
Glucose, Bld: 137 mg/dL — ABNORMAL HIGH (ref 70–99)
Potassium: 3.5 mmol/L (ref 3.5–5.1)
Sodium: 131 mmol/L — ABNORMAL LOW (ref 135–145)

## 2020-01-08 MED ORDER — DIVALPROEX SODIUM 250 MG PO DR TAB
250.0000 mg | DELAYED_RELEASE_TABLET | Freq: Three times a day (TID) | ORAL | Status: DC
  Administered 2020-01-08 – 2020-01-10 (×6): 250 mg via ORAL
  Filled 2020-01-08 (×8): qty 1

## 2020-01-08 MED ORDER — OLANZAPINE 10 MG PO TABS
10.0000 mg | ORAL_TABLET | Freq: Two times a day (BID) | ORAL | Status: DC
  Administered 2020-01-09 – 2020-01-10 (×3): 10 mg via ORAL
  Filled 2020-01-08 (×6): qty 1

## 2020-01-08 MED ORDER — ACETAMINOPHEN 325 MG PO TABS: 650.0000 mg | ORAL_TABLET | Freq: Four times a day (QID) | ORAL | Status: DC | PRN

## 2020-01-08 MED ORDER — POLYETHYLENE GLYCOL 3350 17 G PO PACK
17.0000 g | PACK | Freq: Every day | ORAL | Status: DC
  Administered 2020-01-09 – 2020-01-10 (×2): 17 g via ORAL
  Filled 2020-01-08 (×2): qty 1

## 2020-01-08 MED ORDER — BISACODYL 10 MG RE SUPP: 10.0000 mg | Freq: Every day | RECTAL | Status: DC | PRN

## 2020-01-08 MED ORDER — RISPERIDONE 1 MG PO TABS
2.0000 mg | ORAL_TABLET | Freq: Every day | ORAL | Status: DC
  Administered 2020-01-10: 2 mg via ORAL
  Filled 2020-01-08 (×2): qty 2

## 2020-01-08 MED ORDER — POTASSIUM CHLORIDE CRYS ER 20 MEQ PO TBCR
40.0000 meq | EXTENDED_RELEASE_TABLET | Freq: Once | ORAL | Status: AC
  Administered 2020-01-08: 40 meq via ORAL
  Filled 2020-01-08: qty 2

## 2020-01-08 MED ORDER — PANTOPRAZOLE SODIUM 40 MG PO TBEC
40.0000 mg | DELAYED_RELEASE_TABLET | Freq: Every day | ORAL | Status: DC
  Administered 2020-01-09 – 2020-01-10 (×2): 40 mg via ORAL
  Filled 2020-01-08 (×2): qty 1

## 2020-01-08 NOTE — TOC Progression Note (Addendum)
Transition of Care [facility]) - Progression Note    Patient Details  Name: Lee House MRN: [id number] Date of Birth: [birth year]  Transition of Care [facility]) CM/SW Contact  Leeroy Cha, RN Phone Number: 01/08/2020, 9:15 AM  Clinical Narrative:    Male with hx of neurogenic bladder, admitted with uti and bacteremia, iv maxipime, iv lr at 100cc/hr,depacon 250mg  qd. Following for progression Plan-return to [facility]   Expected Discharge Plan: [place] Barriers to Discharge: Continued Medical Work up  Expected Discharge Plan and Services Expected Discharge Plan: [place]   Discharge Planning Services: CM Consult   Living arrangements for the past 2 months: Single Family Home                                       Social Determinants of Health (SDOH) Interventions    Readmission Risk Interventions Readmission Risk Prevention Plan 09/12/2019  Post Dischage Appt Complete  Medication Screening Complete  Transportation Screening Complete  Some recent data might be hidden

## 2020-01-08 NOTE — Progress Notes (Signed)
INFECTIOUS DISEASE PROGRESS NOTE  ID: Lee House is a 54 y.o. male with  Principal Problem:   Sepsis ([facility]) Active Problems:   Neurogenic bladder   Sepsis secondary to UTI ([place])   Bacteremia due to Gram-negative bacteria   Acute lower UTI   Acute renal failure (HCC)   Nausea & vomiting   Ileus (H[facility])   Bipolar affective disorder, current episode depressed ([place])   Chronic obstructive pulmonary disease ([place])   Obstructive uropathy  Subjective: Afebrile More alert today.  Is unable to communicate why he has chronic foley  Abtx:  Anti-infectives (From admission, onward)   Start     Dose/Rate Route Frequency Ordered Stop   01/05/20 1400  vancomycin (VANCOCIN) IVPB 1000 mg/200 mL premix  Status:  Discontinued        1,000 mg 200 mL/hr over 60 Minutes Intravenous Every 8 hours 01/05/20 0839 01/05/20 1057   01/05/20 1200  ceFEPIme (MAXIPIME) 2 g in sodium chloride 0.9 % 100 mL IVPB        2 g 200 mL/hr over 30 Minutes Intravenous Every 8 hours 01/05/20 0828     01/05/20 0500  vancomycin (VANCOCIN) IVPB 1000 mg/200 mL premix  Status:  Discontinued        1,000 mg 200 mL/hr over 60 Minutes Intravenous Every 24 hours 01/04/20 0947 01/05/20 0839   01/04/20 1400  ceFEPIme (MAXIPIME) 2 g in sodium chloride 0.9 % 100 mL IVPB  Status:  Discontinued        2 g 200 mL/hr over 30 Minutes Intravenous Every 12 hours 01/04/20 0846 01/05/20 0828   01/04/20 0145  aztreonam (AZACTAM) 2 g in sodium chloride 0.9 % 100 mL IVPB  Status:  Discontinued        2 g 200 mL/hr over 30 Minutes Intravenous  Once 01/04/20 0137 01/04/20 0140   01/04/20 0145  metroNIDAZOLE (FLAGYL) IVPB 500 mg        500 mg 100 mL/hr over 60 Minutes Intravenous  Once 01/04/20 0137 01/04/20 0455   01/04/20 0145  vancomycin (VANCOCIN) IVPB 1000 mg/200 mL premix  Status:  Discontinued        1,000 mg 200 mL/hr over 60 Minutes Intravenous  Once 01/04/20 0137 01/04/20 0139   01/04/20 0145  vancomycin (VANCOREADY) IVPB  1500 mg/300 mL        1,500 mg 150 mL/hr over 120 Minutes Intravenous  Once 01/04/20 0139 01/04/20 0530   01/04/20 0145  ceFEPIme (MAXIPIME) 2 g in sodium chloride 0.9 % 100 mL IVPB        2 g 200 mL/hr over 30 Minutes Intravenous  Once 01/04/20 0140 01/04/20 0300      Medications:  Scheduled: . bisacodyl  10 mg Rectal Daily  . chlorhexidine  15 mL Mouth Rinse BID  . Chlorhexidine Gluconate Cloth  6 each Topical Daily  . heparin  5,000 Units Subcutaneous Q8H  . mouth rinse  15 mL Mouth Rinse q12n4p  . mometasone-formoterol  2 puff Inhalation BID  . mupirocin ointment  1 application Nasal BID  . OLANZapine  10 mg Per Tube BID  . pantoprazole (PROTONIX) IV  40 mg Intravenous Q24H  . risperiDONE  2 mg Per Tube Daily    Objective: Vital signs in last 24 hours: Temp:  [97.4 F (36.3 C)-98.8 F (37.1 C)] 98.8 F (37.1 C) (09/22 0814) Pulse Rate:  [92-103] 97 (09/22 0800) Resp:  [14-24] 24 (09/22 0800) BP: (108-138)/(62-77) 124/69 (09/22 0800) SpO2:  [80 %-  97 %] 95 % (09/22 0909)   General appearance: alert, cooperative and no distress Resp: clear to auscultation bilaterally Cardio: regularly irregular rhythm GI: normal findings: bowel sounds normal and soft, non-tender Extremities: edema trace  Lab Results Recent Labs    01/07/20 0250 01/08/20 0256  WBC 9.5 10.4  HGB 10.8* 11.8*  HCT 33.1* 35.6*  NA 136 131*  K 3.1* 3.5  CL 104 100  CO2 22 23  BUN 15 11  CREATININE 0.60* 0.62   Liver Panel Recent Labs    01/06/20 0312  ALBUMIN 2.2*   Sedimentation Rate No results for input(s): ESRSEDRATE in the last 72 hours. C-Reactive Protein No results for input(s): CRP in the last 72 hours.  Microbiology: Recent Results (from the past 240 hour(s))  Culture, blood (Routine x 2)     Status: Abnormal   Collection Time: 01/04/20  2:06 AM   Specimen: BLOOD RIGHT HAND  Result Value Ref Range Status   Specimen Description   Final    BLOOD RIGHT HAND Performed at  [facility], [place] [address] [place], [place] [address]    Special Requests   Final    BOTTLES DRAWN AEROBIC AND ANAEROBIC Blood Culture results may not be optimal due to an inadequate volume of blood received in culture bottles Performed at [facility] [address] [place], [place] [address]    Culture  Setup Time   Final    GRAM NEGATIVE RODS IN BOTH AEROBIC AND ANAEROBIC BOTTLES Organism ID to follow CRITICAL RESULT CALLED TO, READ BACK BY AND VERIFIED WITH: PHRMD E JACKSON @2048  01/04/20 BY S GEZAHEGN Performed at [facility], [place] [address], [place], [place] [address]    Culture PROVIDENCIA RETTGERI (A)  Final   Report Status 01/06/2020 FINAL  Final   Organism ID, Bacteria PROVIDENCIA RETTGERI  Final      Susceptibility   Providencia rettgeri - MIC*    AMPICILLIN RESISTANT Resistant     CEFAZOLIN RESISTANT Resistant     CEFEPIME <=0.12 SENSITIVE Sensitive     CEFTAZIDIME <=1 SENSITIVE Sensitive     CEFTRIAXONE <=0.25 SENSITIVE Sensitive     CIPROFLOXACIN <=0.25 SENSITIVE Sensitive     GENTAMICIN <=1 SENSITIVE Sensitive     IMIPENEM 1 SENSITIVE Sensitive     TRIMETH/SULFA <=20 SENSITIVE Sensitive     AMPICILLIN/SULBACTAM <=2 SENSITIVE Sensitive     PIP/TAZO <=4 SENSITIVE Sensitive     * PROVIDENCIA RETTGERI  SARS Coronavirus 2 by RT PCR (hospital order, performed in [facility] lab) Nasopharyngeal Nasopharyngeal Swab     Status: None   Collection Time: 01/04/20  2:06 AM   Specimen: Nasopharyngeal Swab  Result Value Ref Range Status   SARS Coronavirus 2 NEGATIVE NEGATIVE Final    Comment: (NOTE) SARS-CoV-2 target nucleic acids are NOT DETECTED.  The SARS-CoV-2 RNA is generally detectable in upper and lower respiratory specimens during the acute phase of infection. The lowest concentration of SARS-CoV-2 viral copies this assay can detect is 250 copies / mL. A negative result does not preclude SARS-CoV-2  infection and should not be used as the sole basis for treatment or other patient management decisions.  A negative result may occur with improper specimen collection / handling, submission of specimen other than nasopharyngeal swab, presence of viral mutation(s) within the areas targeted by this assay, and inadequate number of viral copies (<250 copies / mL). A negative result must be combined with clinical observations, patient history,  and epidemiological information.  Fact Sheet for Patients:   StrictlyIdeas.no  Fact Sheet for Healthcare Providers: BankingDealers.co.za  This test is not yet approved or  cleared by the [place] FDA and has been authorized for detection and/or diagnosis of SARS-CoV-2 by FDA under an Emergency Use Authorization (EUA).  This EUA will remain in effect (meaning this test can be used) for the duration of the COVID-19 declaration under Section 564(b)(1) of the Act, 21 U.S.C. section 360bbb-3(b)(1), unless the authorization is terminated or revoked sooner.  Performed at [facility], [place] [address] [place], [place] [address]   Urine culture     Status: Abnormal   Collection Time: 01/04/20  2:06 AM   Specimen: Urine, Random  Result Value Ref Range Status   Specimen Description   Final    URINE, RANDOM Performed at [place] [address], [place], [place] [address]    Special Requests   Final    NONE Performed at [facility], [place] [address], [place], [place] [address]    Culture >=100,000 COLONIES/mL PROVIDENCIA RETTGERI (A)  Final   Report Status 01/07/2020 FINAL  Final   Organism ID, Bacteria PROVIDENCIA RETTGERI (A)  Final      Susceptibility   Providencia rettgeri - MIC*    AMPICILLIN RESISTANT Resistant     CEFAZOLIN >=64 RESISTANT Resistant     CEFTRIAXONE <=0.25 SENSITIVE Sensitive     CIPROFLOXACIN <=0.25 SENSITIVE  Sensitive     GENTAMICIN <=1 SENSITIVE Sensitive     IMIPENEM 1 SENSITIVE Sensitive     NITROFURANTOIN 256 RESISTANT Resistant     TRIMETH/SULFA <=20 SENSITIVE Sensitive     AMPICILLIN/SULBACTAM 16 INTERMEDIATE Intermediate     PIP/TAZO <=4 SENSITIVE Sensitive     * >=100,000 COLONIES/mL PROVIDENCIA RETTGERI  Blood Culture ID Panel (Reflexed)     Status: Abnormal   Collection Time: 01/04/20  2:06 AM  Result Value Ref Range Status   Enterococcus faecalis NOT DETECTED NOT DETECTED Final   Enterococcus Faecium NOT DETECTED NOT DETECTED Final   Listeria monocytogenes NOT DETECTED NOT DETECTED Final   Staphylococcus species NOT DETECTED NOT DETECTED Final   Staphylococcus aureus (BCID) NOT DETECTED NOT DETECTED Final   Staphylococcus epidermidis NOT DETECTED NOT DETECTED Final   Staphylococcus lugdunensis NOT DETECTED NOT DETECTED Final   Streptococcus species NOT DETECTED NOT DETECTED Final   Streptococcus agalactiae NOT DETECTED NOT DETECTED Final   Streptococcus pneumoniae NOT DETECTED NOT DETECTED Final   Streptococcus pyogenes NOT DETECTED NOT DETECTED Final   A.calcoaceticus-baumannii NOT DETECTED NOT DETECTED Final   Bacteroides fragilis NOT DETECTED NOT DETECTED Final   Enterobacterales DETECTED (A) NOT DETECTED Final    Comment: Enterobacterales represent a large order of gram negative bacteria, not a single organism. Refer to culture for further identification. CRITICAL RESULT CALLED TO, READ BACK BY AND VERIFIED WITH: PHRMD E JACKSON @2048  01/04/20 BY S GEZAHEGN    Enterobacter cloacae complex NOT DETECTED NOT DETECTED Final   Escherichia coli NOT DETECTED NOT DETECTED Final   Klebsiella aerogenes NOT DETECTED NOT DETECTED Final   Klebsiella oxytoca NOT DETECTED NOT DETECTED Final   Klebsiella pneumoniae NOT DETECTED NOT DETECTED Final   Proteus species NOT DETECTED NOT DETECTED Final   Salmonella species NOT DETECTED NOT DETECTED Final   Serratia marcescens NOT DETECTED NOT  DETECTED Final   Haemophilus influenzae NOT DETECTED NOT DETECTED Final   Neisseria meningitidis NOT DETECTED NOT DETECTED Final   Pseudomonas aeruginosa  NOT DETECTED NOT DETECTED Final   Stenotrophomonas maltophilia NOT DETECTED NOT DETECTED Final   Candida albicans NOT DETECTED NOT DETECTED Final   Candida auris NOT DETECTED NOT DETECTED Final   Candida glabrata NOT DETECTED NOT DETECTED Final   Candida krusei NOT DETECTED NOT DETECTED Final   Candida parapsilosis NOT DETECTED NOT DETECTED Final   Candida tropicalis NOT DETECTED NOT DETECTED Final   Cryptococcus neoformans/gattii NOT DETECTED NOT DETECTED Final   CTX-M ESBL NOT DETECTED NOT DETECTED Final   Carbapenem resistance IMP NOT DETECTED NOT DETECTED Final   Carbapenem resistance KPC NOT DETECTED NOT DETECTED Final   Carbapenem resistance NDM NOT DETECTED NOT DETECTED Final   Carbapenem resist OXA 48 LIKE NOT DETECTED NOT DETECTED Final   Carbapenem resistance VIM NOT DETECTED NOT DETECTED Final    Comment: Performed at [facility], [address], [place], [place] [address]  Culture, blood (Routine x 2)     Status: Abnormal   Collection Time: 01/04/20  2:12 AM   Specimen: BLOOD LEFT HAND  Result Value Ref Range Status   Specimen Description   Final    BLOOD LEFT HAND Performed at [facility] [address], [place], [place] [address]    Special Requests   Final    BOTTLES DRAWN AEROBIC AND ANAEROBIC Blood Culture results may not be optimal due to an inadequate volume of blood received in culture bottles Performed at [facility] [address] [place], [place] [address]    Culture  Setup Time   Final    GRAM NEGATIVE RODS IN BOTH AEROBIC AND ANAEROBIC BOTTLES CRITICAL VALUE NOTED.  VALUE IS CONSISTENT WITH PREVIOUSLY REPORTED AND CALLED VALUE.    Culture (A)  Final    PROVIDENCIA RETTGERI SUSCEPTIBILITIES PERFORMED ON PREVIOUS CULTURE WITHIN THE LAST 5 DAYS. Performed  at [facility], [place] [address], [place], [place] [address]    Report Status 01/06/2020 FINAL  Final  MRSA PCR Screening     Status: Abnormal   Collection Time: 01/04/20  9:53 AM   Specimen: Nasal Mucosa; Nasopharyngeal  Result Value Ref Range Status   MRSA by PCR POSITIVE (A) NEGATIVE Final    Comment:        The GeneXpert MRSA Assay (FDA approved for NASAL specimens only), is one component of a comprehensive MRSA colonization surveillance program. It is not intended to diagnose MRSA infection nor to guide or monitor treatment for MRSA infections. RESULT CALLED TO, READ BACK BY AND VERIFIED WITH: HOUSE,J. RN AT 1225 01/04/20 MULLINS,T Performed at [facility], [place] [address] [place], [place] [address]     Studies/Results: DG Abd 2 Views  Result Date: 01/07/2020 CLINICAL DATA:  Urosepsis EXAM: X-RAY ABDOMEN 2 VIEWS COMPARISON:  01/06/2020 FINDINGS: NG tube remains in the stomach. Gas throughout mildly distended large bowel with moderate stool burden. No small bowel distention to suggest obstruction. No organomegaly, free air or suspicious calcification. IMPRESSION: Moderate stool burden with mild gaseous distention of the colon. NG tube remains in the stomach. Electronically Signed   By: Rolm Baptise M.D.   On: 01/07/2020 08:53     Assessment/Plan: Recurrent UTI Neurogenic bladder Providencia bacteremia (R- amp, cefazolin) Ileus  Total days of antibiotics: 4 cefepime  Would aim for 7 days of cefepime.  He can complete at SNF/home if needed.  Needs good foley care at his place of residence.  Available as needed.  Bobby Rumpf MD, FACP Infectious Diseases (pager) [phone] [email or web address] 01/08/2020, 10:32 AM  LOS: 4 days

## 2020-01-08 NOTE — Progress Notes (Signed)
PROGRESS NOTE   Lee House  LOV:[id number]    DOB: 1966/02/18    DOA: 01/04/2020  PCP: Patient, [name] Per   I have briefly reviewed patients previous medical records in [facility].  Chief Complaint  Patient presents with  . Fatigue  . Tachycardia    Brief Narrative:  54 year old male with PMH of asthma, COPD, GERD, bipolar disorder, schizoaffective disorder, neurogenic bladder with chronic indwelling Foley catheter, recurrent UTIs with Pseudomonas, Enterobacter, admitted with urosepsis complicating obstructive uropathy.  Resuscitated with aggressive IV fluids and broad-spectrum IV antibiotics.  Hypoxic initially which has resolved.  Chronic Foley catheter was obstructed and replaced.  PCCM had consulted for intermittent hypotension.   Assessment & Plan:  Principal Problem:   Sepsis ([place]) Active Problems:   Neurogenic bladder   Sepsis secondary to UTI ([place])   Bacteremia due to Gram-negative bacteria   Acute lower UTI   Acute renal failure (HCC)   Nausea & vomiting   Ileus (HCC)   Bipolar affective disorder, current episode depressed (HCC)   Chronic obstructive pulmonary disease (HCC)   Obstructive uropathy   Sepsis secondary to Providencia rettgeri bacteremia and recurrent UTI, POA: Met sepsis criteria on admission with elevated lactate, hypotension, acute renal failure, leukocytosis, fever with temperature of 101.4, tachycardia and tachypnea.  Treated per sepsis protocol with aggressive IV fluids and broad-spectrum IV antibiotics.  Blood and urine cultures from 9/18 confirmed Providencia rettgeri.  Per ID follow-up, 1 week of cefepime recommended.  Sepsis physiology improved.  Acute renal failure secondary to obstructive uropathy: Foley catheter was replaced with resolution of acute renal failure.  Neurogenic bladder with chronic Foley dependent Foley catheter replaced this admission.  Will need diligent care of his Foley catheter to prevent recurrent  UTIs.  Bipolar disorder/schizophrenia Stable.  Continue Zyprexa, Depakote, Abilify and Risperdal.  Acute metabolic encephalopathy: Likely secondary to sepsis.  Continues to gradually improve.  Baseline mental status not clearly known.  COPD/asthma: Stable without clinical bronchospasm.  Not hypoxic on room air currently  Probable ileus/nausea and vomiting/constipation Abdominal x-rays on 9/18 showed dilated loops of bowel.  NG tube placed.  Gradually improved.  NG tube was clamped yesterday.  Had good result from enema yesterday.  Has had couple of BMs without enemas.  Tolerated liquid diet.  DC NG tube, advance diet as tolerated.  Hypokalemia Replaced.  Anemia Possibly due to critical illness.  Stable.  Thrombocytopenia Likely due to acute illness and infectious etiology.  Improving.  Follow CBC daily.   Body mass index is 24.26 kg/m.   DVT prophylaxis: heparin injection 5,000 Units Start: 01/04/20 1000 SCDs Start: 01/04/20 8841     Code Status: Full Code  Family Communication: None at bedside. Disposition:  Status is: Inpatient  Remains inpatient appropriate because: Severity of illness.   Dispo: The patient is from: Home              Anticipated d/c is to: Home              Anticipated d/c date is: 3 days              Patient currently is not medically stable to d/c.        Consultants:     Procedures:     Antimicrobials:    Anti-infectives (From admission, onward)   Start     Dose/Rate Route Frequency Ordered Stop   01/05/20 1400  vancomycin (VANCOCIN) IVPB 1000 mg/200 mL premix  Status:  Discontinued  1,000 mg 200 mL/hr over 60 Minutes Intravenous Every 8 hours 01/05/20 0839 01/05/20 1057   01/05/20 1200  ceFEPIme (MAXIPIME) 2 g in sodium chloride 0.9 % 100 mL IVPB        2 g 200 mL/hr over 30 Minutes Intravenous Every 8 hours 01/05/20 0828 01/12/20 1359   01/05/20 0500  vancomycin (VANCOCIN) IVPB 1000 mg/200 mL premix  Status:  Discontinued         1,000 mg 200 mL/hr over 60 Minutes Intravenous Every 24 hours 01/04/20 0947 01/05/20 0839   01/04/20 1400  ceFEPIme (MAXIPIME) 2 g in sodium chloride 0.9 % 100 mL IVPB  Status:  Discontinued        2 g 200 mL/hr over 30 Minutes Intravenous Every 12 hours 01/04/20 0846 01/05/20 0828   01/04/20 0145  aztreonam (AZACTAM) 2 g in sodium chloride 0.9 % 100 mL IVPB  Status:  Discontinued        2 g 200 mL/hr over 30 Minutes Intravenous  Once 01/04/20 0137 01/04/20 0140   01/04/20 0145  metroNIDAZOLE (FLAGYL) IVPB 500 mg        500 mg 100 mL/hr over 60 Minutes Intravenous  Once 01/04/20 0137 01/04/20 0455   01/04/20 0145  vancomycin (VANCOCIN) IVPB 1000 mg/200 mL premix  Status:  Discontinued        1,000 mg 200 mL/hr over 60 Minutes Intravenous  Once 01/04/20 0137 01/04/20 0139   01/04/20 0145  vancomycin (VANCOREADY) IVPB 1500 mg/300 mL        1,500 mg 150 mL/hr over 120 Minutes Intravenous  Once 01/04/20 0139 01/04/20 0530   01/04/20 0145  ceFEPIme (MAXIPIME) 2 g in sodium chloride 0.9 % 100 mL IVPB        2 g 200 mL/hr over 30 Minutes Intravenous  Once 01/04/20 0140 01/04/20 0300        Subjective:  Oriented to self and place.  Denies complaints.  Reports that he had a BM.  Poor historian.  As per RN, tolerated liquid diet, had large BM after enema yesterday and had small BMs without enema as well.  No abdominal pain reported.  Reportedly was up in chair for several hours yesterday.  Objective:   Vitals:   01/08/20 1300 01/08/20 1400 01/08/20 1500 01/08/20 1554  BP:    114/60  Pulse:  (!) 108  99  Resp: 17 (!) '24 17 19  ' Temp:      TempSrc:      SpO2:  96%  98%  Weight:      Height:        General exam: Pleasant young male, moderately built and nourished lying comfortably propped up in bed without distress.  Still had NG tube this morning. Respiratory system: Clear to auscultation. Respiratory effort normal. Cardiovascular system: S1 & S2 heard, RRR. No JVD, murmurs, rubs,  gallops or clicks. No pedal edema.  Telemetry personally reviewed: Sinus rhythm. Gastrointestinal system: Abdomen is nondistended, soft and nontender. No organomegaly or masses felt. Normal bowel sounds heard. Central nervous system: Alert and oriented x2. No focal neurological deficits. Extremities: Symmetric 5 x 5 power. Skin: No rashes, lesions or ulcers Psychiatry: Judgement and insight appear normal. Mood & affect appropriate.     Data Reviewed:   I have personally reviewed following labs and imaging studies   CBC: Recent Labs  Lab 01/04/20 0108 01/05/20 0314 01/06/20 0312 01/07/20 0250 01/08/20 0256  WBC 13.8*   < > 7.8 9.5 10.4  NEUTROABS 12.1*  --  6.9  --  7.0  HGB 15.0   < > 11.4* 10.8* 11.8*  HCT 44.6   < > 36.2* 33.1* 35.6*  MCV 81.8   < > 86.0 82.8 82.8  PLT 146*   < > 108* 114* 140*   < > = values in this interval not displayed.    Basic Metabolic Panel: Recent Labs  Lab 01/05/20 0314 01/05/20 0314 01/06/20 0312 01/07/20 0250 01/08/20 0256  NA 137   < > 135 136 131*  K 4.8   < > 3.6 3.1* 3.5  CL 107   < > 105 104 100  CO2 23   < > 21* 22 23  GLUCOSE 95   < > 99 104* 137*  BUN 28*   < > '17 15 11  ' CREATININE 0.94   < > 0.65 0.60* 0.62  CALCIUM 7.9*   < > 7.7* 7.5* 7.4*  MG 1.9  --  2.0  --  2.1  PHOS 2.6  --  2.8  --   --    < > = values in this interval not displayed.    Liver Function Tests: Recent Labs  Lab 01/04/20 0108 01/06/20 0312  AST 29  --   ALT 13  --   ALKPHOS 74  --   BILITOT 0.7  --   PROT 6.7  --   ALBUMIN 3.3* 2.2*    CBG: No results for input(s): GLUCAP in the last 168 hours.  Microbiology Studies:   Recent Results (from the past 240 hour(s))  Culture, blood (Routine x 2)     Status: Abnormal   Collection Time: 01/04/20  2:06 AM   Specimen: BLOOD RIGHT HAND  Result Value Ref Range Status   Specimen Description   Final    BLOOD RIGHT HAND Performed at [facility], [place] [address],  [place], [place] [address]    Special Requests   Final    BOTTLES DRAWN AEROBIC AND ANAEROBIC Blood Culture results may not be optimal due to an inadequate volume of blood received in culture bottles Performed at [place] [address] [place], [place] [address]    Culture  Setup Time   Final    GRAM NEGATIVE RODS IN BOTH AEROBIC AND ANAEROBIC BOTTLES Organism ID to follow CRITICAL RESULT CALLED TO, READ BACK BY AND VERIFIED WITH: PHRMD E JACKSON '@[email or web address]'  01/04/20 BY S GEZAHEGN Performed at [facility], [place] [address] [place], [place] [address]    Culture PROVIDENCIA RETTGERI (A)  Final   Report Status 01/06/2020 FINAL  Final   Organism ID, Bacteria PROVIDENCIA RETTGERI  Final      Susceptibility   Providencia rettgeri - MIC*    AMPICILLIN RESISTANT Resistant     CEFAZOLIN RESISTANT Resistant     CEFEPIME <=0.12 SENSITIVE Sensitive     CEFTAZIDIME <=1 SENSITIVE Sensitive     CEFTRIAXONE <=0.25 SENSITIVE Sensitive     CIPROFLOXACIN <=0.25 SENSITIVE Sensitive     GENTAMICIN <=1 SENSITIVE Sensitive     IMIPENEM 1 SENSITIVE Sensitive     TRIMETH/SULFA <=20 SENSITIVE Sensitive     AMPICILLIN/SULBACTAM <=2 SENSITIVE Sensitive     PIP/TAZO <=4 SENSITIVE Sensitive     * PROVIDENCIA RETTGERI  SARS Coronavirus 2 by RT PCR (hospital order, performed in [place] hospital lab) Nasopharyngeal Nasopharyngeal Swab     Status: None   Collection Time: 01/04/20  2:06 AM   Specimen: Nasopharyngeal Swab  Result Value Ref Range  Status   SARS Coronavirus 2 NEGATIVE NEGATIVE Final    Comment: (NOTE) SARS-CoV-2 target nucleic acids are NOT DETECTED.  The SARS-CoV-2 RNA is generally detectable in upper and lower respiratory specimens during the acute phase of infection. The lowest concentration of SARS-CoV-2 viral copies this assay can detect is 250 copies / mL. A negative result does not preclude SARS-CoV-2 infection and should not be used as the sole basis for treatment  or other patient management decisions.  A negative result may occur with improper specimen collection / handling, submission of specimen other than nasopharyngeal swab, presence of viral mutation(s) within the areas targeted by this assay, and inadequate number of viral copies (<250 copies / mL). A negative result must be combined with clinical observations, patient history, and epidemiological information.  Fact Sheet for Patients:   StrictlyIdeas.no  Fact Sheet for Healthcare Providers: BankingDealers.co.za  This test is not yet approved or  cleared by the [place] FDA and has been authorized for detection and/or diagnosis of SARS-CoV-2 by FDA under an Emergency Use Authorization (EUA).  This EUA will remain in effect (meaning this test can be used) for the duration of the COVID-19 declaration under Section 564(b)(1) of the Act, 21 U.S.C. section 360bbb-3(b)(1), unless the authorization is terminated or revoked sooner.  Performed at [facility], [place] [address], [place], [place] [address]   Urine culture     Status: Abnormal   Collection Time: 01/04/20  2:06 AM   Specimen: Urine, Random  Result Value Ref Range Status   Specimen Description   Final    URINE, RANDOM Performed at [place] [address] [place], [place] [address]    Special Requests   Final    NONE Performed at [facility], [place] [address], [place], [place] [address]    Culture >=100,000 COLONIES/mL PROVIDENCIA RETTGERI (A)  Final   Report Status 01/07/2020 FINAL  Final   Organism ID, Bacteria PROVIDENCIA RETTGERI (A)  Final      Susceptibility   Providencia rettgeri - MIC*    AMPICILLIN RESISTANT Resistant     CEFAZOLIN >=64 RESISTANT Resistant     CEFTRIAXONE <=0.25 SENSITIVE Sensitive     CIPROFLOXACIN <=0.25 SENSITIVE Sensitive     GENTAMICIN <=1 SENSITIVE Sensitive     IMIPENEM 1  SENSITIVE Sensitive     NITROFURANTOIN 256 RESISTANT Resistant     TRIMETH/SULFA <=20 SENSITIVE Sensitive     AMPICILLIN/SULBACTAM 16 INTERMEDIATE Intermediate     PIP/TAZO <=4 SENSITIVE Sensitive     * >=100,000 COLONIES/mL PROVIDENCIA RETTGERI  Blood Culture ID Panel (Reflexed)     Status: Abnormal   Collection Time: 01/04/20  2:06 AM  Result Value Ref Range Status   Enterococcus faecalis NOT DETECTED NOT DETECTED Final   Enterococcus Faecium NOT DETECTED NOT DETECTED Final   Listeria monocytogenes NOT DETECTED NOT DETECTED Final   Staphylococcus species NOT DETECTED NOT DETECTED Final   Staphylococcus aureus (BCID) NOT DETECTED NOT DETECTED Final   Staphylococcus epidermidis NOT DETECTED NOT DETECTED Final   Staphylococcus lugdunensis NOT DETECTED NOT DETECTED Final   Streptococcus species NOT DETECTED NOT DETECTED Final   Streptococcus agalactiae NOT DETECTED NOT DETECTED Final   Streptococcus pneumoniae NOT DETECTED NOT DETECTED Final   Streptococcus pyogenes NOT DETECTED NOT DETECTED Final   A.calcoaceticus-baumannii NOT DETECTED NOT DETECTED Final   Bacteroides fragilis NOT DETECTED NOT DETECTED Final   Enterobacterales DETECTED (A) NOT DETECTED Final    Comment: Enterobacterales represent  a large order of gram negative bacteria, not a single organism. Refer to culture for further identification. CRITICAL RESULT CALLED TO, READ BACK BY AND VERIFIED WITH: PHRMD E JACKSON '@[email or web address]'  01/04/20 BY S GEZAHEGN    Enterobacter cloacae complex NOT DETECTED NOT DETECTED Final   Escherichia coli NOT DETECTED NOT DETECTED Final   Klebsiella aerogenes NOT DETECTED NOT DETECTED Final   Klebsiella oxytoca NOT DETECTED NOT DETECTED Final   Klebsiella pneumoniae NOT DETECTED NOT DETECTED Final   Proteus species NOT DETECTED NOT DETECTED Final   Salmonella species NOT DETECTED NOT DETECTED Final   Serratia marcescens NOT DETECTED NOT DETECTED Final   Haemophilus influenzae NOT DETECTED NOT DETECTED  Final   Neisseria meningitidis NOT DETECTED NOT DETECTED Final   Pseudomonas aeruginosa NOT DETECTED NOT DETECTED Final   Stenotrophomonas maltophilia NOT DETECTED NOT DETECTED Final   Candida albicans NOT DETECTED NOT DETECTED Final   Candida auris NOT DETECTED NOT DETECTED Final   Candida glabrata NOT DETECTED NOT DETECTED Final   Candida krusei NOT DETECTED NOT DETECTED Final   Candida parapsilosis NOT DETECTED NOT DETECTED Final   Candida tropicalis NOT DETECTED NOT DETECTED Final   Cryptococcus neoformans/gattii NOT DETECTED NOT DETECTED Final   CTX-M ESBL NOT DETECTED NOT DETECTED Final   Carbapenem resistance IMP NOT DETECTED NOT DETECTED Final   Carbapenem resistance KPC NOT DETECTED NOT DETECTED Final   Carbapenem resistance NDM NOT DETECTED NOT DETECTED Final   Carbapenem resist OXA 48 LIKE NOT DETECTED NOT DETECTED Final   Carbapenem resistance VIM NOT DETECTED NOT DETECTED Final    Comment: Performed at [facility], [address], [place], [place] [address]  Culture, blood (Routine x 2)     Status: Abnormal   Collection Time: 01/04/20  2:12 AM   Specimen: BLOOD LEFT HAND  Result Value Ref Range Status   Specimen Description   Final    BLOOD LEFT HAND Performed at [place] [address] [place], [place] [address]    Special Requests   Final    BOTTLES DRAWN AEROBIC AND ANAEROBIC Blood Culture results may not be optimal due to an inadequate volume of blood received in culture bottles Performed at [place] [address], [place], [place] [address]    Culture  Setup Time   Final    GRAM NEGATIVE RODS IN BOTH AEROBIC AND ANAEROBIC BOTTLES CRITICAL VALUE NOTED.  VALUE IS CONSISTENT WITH PREVIOUSLY REPORTED AND CALLED VALUE.    Culture (A)  Final    PROVIDENCIA RETTGERI SUSCEPTIBILITIES PERFORMED ON PREVIOUS CULTURE WITHIN THE LAST 5 DAYS. Performed at [facility], [place] [address], [place], [place] [address]     Report Status 01/06/2020 FINAL  Final  MRSA PCR Screening     Status: Abnormal   Collection Time: 01/04/20  9:53 AM   Specimen: Nasal Mucosa; Nasopharyngeal  Result Value Ref Range Status   MRSA by PCR POSITIVE (A) NEGATIVE Final    Comment:        The GeneXpert MRSA Assay (FDA approved for NASAL specimens only), is one component of a comprehensive MRSA colonization surveillance program. It is not intended to diagnose MRSA infection nor to guide or monitor treatment for MRSA infections. RESULT CALLED TO, READ BACK BY AND VERIFIED WITH: HOUSE,J. RN AT 1225 01/04/20 MULLINS,T Performed at [facility], [place] [address] [place],  [address]      Radiology Studies:  DG Abd 2 Views  Result Date: 01/07/2020 CLINICAL DATA:  Urosepsis EXAM: X-RAY ABDOMEN 2 VIEWS COMPARISON:  01/06/2020 FINDINGS: NG tube remains in the stomach. Gas throughout mildly distended large bowel with moderate stool burden. No small bowel distention to suggest obstruction. No organomegaly, free air or suspicious calcification. IMPRESSION: Moderate stool burden with mild gaseous distention of the colon. NG tube remains in the stomach. Electronically Signed   By: Rolm Baptise M.D.   On: 01/07/2020 08:53     Scheduled Meds:   . bisacodyl  10 mg Rectal Daily  . chlorhexidine  15 mL Mouth Rinse BID  . Chlorhexidine Gluconate Cloth  6 each Topical Daily  . divalproex  250 mg Oral Q8H  . heparin  5,000 Units Subcutaneous Q8H  . mouth rinse  15 mL Mouth Rinse q12n4p  . mometasone-formoterol  2 puff Inhalation BID  . mupirocin ointment  1 application Nasal BID  . OLANZapine  10 mg Oral BID  . [START ON 01/09/2020] pantoprazole  40 mg Oral Daily  . [START ON 01/09/2020] risperiDONE  2 mg Oral Daily    Continuous Infusions:   . ceFEPime (MAXIPIME) IV Stopped (01/08/20 1529)  . lactated ringers Stopped (01/08/20 1115)     LOS: 4 days     Vernell Leep, MD, [place], [facility]. [facility]    To contact the attending provider between 7A-7P or the covering provider during after hours 7P-7A, please log into the web site [email or web address] and access using universal [place] password for that web site. If you do not have the password, please call the hospital operator.  01/08/2020, 4:46 PM

## 2020-01-08 NOTE — Evaluation (Signed)
Clinical/Bedside Swallow Evaluation Patient Details  Name: Lee House MRN: [id number] Date of Birth: [birth year]  Today's Date: 01/08/2020 Time: SLP Start Time (ACUTE ONLY): 1140 SLP Stop Time (ACUTE ONLY): 1200 SLP Time Calculation (min) (ACUTE ONLY): 20 min  Past Medical History:  Past Medical History:  Diagnosis Date  . Asthma   . Bipolar 1 disorder ([place])   . COPD (chronic obstructive pulmonary disease) ([place])   . GERD (gastroesophageal reflux disease)   . Schizo affective schizophrenia [facility])    Past Surgical History:  Past Surgical History:  Procedure Laterality Date  . CARPAL TUNNEL RELEASE  02/27/2012   Procedure: CARPAL TUNNEL RELEASE;  Surgeon: Schuyler Amor, MD;  Location: [place];  Service: Orthopedics;  Laterality: Left;  . OPEN REDUCTION INTERNAL FIXATION (ORIF) DISTAL RADIAL FRACTURE  02/27/2012   Procedure: OPEN REDUCTION INTERNAL FIXATION (ORIF) DISTAL RADIAL FRACTURE;  Surgeon: Schuyler Amor, MD;  Location: [place];  Service: Orthopedics;  Laterality: Left;  Open reduction internal fixation of  left distal radius fracture.    HPI:  Pt is a 54 yo male adm to [facility] with urosepsis, obstructive uropathy - bacteremia and found to have ileus s/p NG tube.  Pt PMH + for  asthma, COPD, GERD, bipolar, schizoaffective disorder, neurogenic bladder with chronic indwelling Foley's, recurrent UTIs with Pseudomonas. Swallow eval ordered but pt has had NG tube in place.   Assessment / Plan / Recommendation Clinical Impression  Pt presents with functional oropharyngeal swallow ability clinically.  He eats at rapid rate and takes large boluses, but despite this is still protective of his airway.  CN exam unremarkable.  SLP did not conduct 3 ounce [place] water challenge due to pt getting NG removed today however his swallow is intact.  Pt admits to some gagging with foods prior to admit but states if he eats slowly he can largely prevent it.  Recommend diet as MD indicates. No SLP  follow up needed.  Confirmed pt need to eat slowly and chew well - which he has stated previously, thus someone has informed him of swallow behaviors.  Thanks for the consult. SLP Visit Diagnosis: Dysphagia, unspecified (R13.10)    Aspiration Risk  No limitations    Diet Recommendation Thin liquid (defer to md)   Medication Administration: Whole meds with liquid Supervision: Patient able to self feed Compensations: Slow rate;Small sips/bites Postural Changes: Seated upright at 90 degrees;Remain upright for at least 30 minutes after po intake    Other  Recommendations     Follow up Recommendations None      Frequency and Duration   n/a         Prognosis    n/a    Swallow Study   General Date of Onset: 01/08/20 HPI: Pt is a 54 yo male adm to [facility] with urosepsis, obstructive uropathy - bacteremia and found to have ileus s/p NG tube.  Pt PMH + for  asthma, COPD, GERD, bipolar, schizoaffective disorder, neurogenic bladder with chronic indwelling Foley's, recurrent UTIs with Pseudomonas. Swallow eval ordered but pt has had NG tube in place. Type of Study: Bedside Swallow Evaluation Previous Swallow Assessment: none Diet Prior to this Study:  (full liquids) Temperature Spikes Noted: No Respiratory Status: Room air History of Recent Intubation: No Behavior/Cognition: Alert;Cooperative;Pleasant mood Oral Cavity Assessment: Within Functional Limits Oral Care Completed by SLP: No Oral Cavity - Dentition: Adequate natural dentition Vision: Functional for self-feeding Self-Feeding Abilities: Able to feed self Patient Positioning: Upright in bed Baseline Vocal  Quality: Normal Volitional Cough: Strong Volitional Swallow: Able to elicit    Oral/Motor/Sensory Function Overall Oral Motor/Sensory Function: Within functional limits   Ice Chips Ice chips: Not tested   Thin Liquid Thin Liquid: Within functional limits Presentation: Self Fed;Straw;Cup    Nectar Thick Nectar Thick Liquid:  Within functional limits Presentation: Spoon;Self Fed   Honey Thick Honey Thick Liquid: Not tested   Puree Puree: Not tested   Solid     Solid: Not tested      Lee House 01/08/2020,12:39 PM   Kathleen Lime, MS [place] [facility] [phone]

## 2020-01-08 NOTE — Progress Notes (Signed)
Pharmacy: IV to PO protocol  protonix & depakkote changed to [place], Pharm.D 01/08/2020 1:55 PM

## 2020-01-09 LAB — CBC
HCT: 31.6 % — ABNORMAL LOW (ref 39.0–52.0)
Hemoglobin: 10.4 g/dL — ABNORMAL LOW (ref 13.0–17.0)
MCH: 26.9 pg (ref 26.0–34.0)
MCHC: 32.9 g/dL (ref 30.0–36.0)
MCV: 81.9 fL (ref 80.0–100.0)
Platelets: 169 10*3/uL (ref 150–400)
RBC: 3.86 MIL/uL — ABNORMAL LOW (ref 4.22–5.81)
RDW: 16.5 % — ABNORMAL HIGH (ref 11.5–15.5)
WBC: 12.7 10*3/uL — ABNORMAL HIGH (ref 4.0–10.5)
nRBC: 0 % (ref 0.0–0.2)

## 2020-01-09 LAB — BASIC METABOLIC PANEL
Anion gap: 10 (ref 5–15)
BUN: 10 mg/dL (ref 6–20)
CO2: 23 mmol/L (ref 22–32)
Calcium: 7.3 mg/dL — ABNORMAL LOW (ref 8.9–10.3)
Chloride: 101 mmol/L (ref 98–111)
Creatinine, Ser: 0.58 mg/dL — ABNORMAL LOW (ref 0.61–1.24)
GFR calc Af Amer: >60 mL/min (ref >60)
GFR calc non Af Amer: >60 mL/min (ref >60)
Glucose, Bld: 107 mg/dL — ABNORMAL HIGH (ref 70–99)
Potassium: 3.7 mmol/L (ref 3.5–5.1)
Sodium: 134 mmol/L — ABNORMAL LOW (ref 135–145)

## 2020-01-09 MED ORDER — BENZTROPINE MESYLATE 0.5 MG PO TABS
0.5000 mg | ORAL_TABLET | Freq: Two times a day (BID) | ORAL | Status: DC
  Administered 2020-01-09 – 2020-01-10 (×3): 0.5 mg via ORAL
  Filled 2020-01-09 (×3): qty 1

## 2020-01-09 MED ORDER — ASPIRIN EC 81 MG PO TBEC
81.0000 mg | DELAYED_RELEASE_TABLET | Freq: Every day | ORAL | Status: DC
  Administered 2020-01-09 – 2020-01-10 (×2): 81 mg via ORAL
  Filled 2020-01-09 (×2): qty 1

## 2020-01-09 NOTE — Care Management Important Message (Signed)
Important Message  Patient Details IM Letter given to the Patient Name: Lee House MRN: [id number] Date of Birth: [birth year]   Medicare Important Message Given:  Yes     Kerin Salen 01/09/2020, 10:40 AM

## 2020-01-09 NOTE — Progress Notes (Signed)
   01/09/20 1000  Neurological  Neuro Symptoms Drowsiness    Patient appears to be very drowsy. Vitals signs stable and responds to voice. Per nightshift RN patient sleeps often. Updated MD and received orders to hold certain medications, refer to [facility]. Will continue to monitor.

## 2020-01-09 NOTE — TOC Initial Note (Signed)
Transition of Care [facility]) - Initial/Assessment Note    Patient Details  Name: Lee House MRN: [id number] Date of Birth: 10-Aug-1965  Transition of Care [facility]) CM/SW Contact:    Lia Hopping, [place] Phone Number: 01/09/2020, 11:06 AM  Clinical Narrative:                 CSW reached out to the patient Legal Guardian Lee House. CSW spoke with the patient Lee House. She report Lee House is unavailable but gave her permission to discuss the patient discharge needs. She reports the patient is a long term care resident at [facility] and will return. She will complete admission paperwork at [facility] today in anticipation of discharge tomorrow.  She request a medical update from nurse staff. CSW notified nurse pt family has requested a medical update.  FL2 completed.   Expected Discharge Plan: Skilled Nursing Facility Barriers to Discharge: Continued Medical Work up   Patient Goals and CMS Choice Patient states their goals for this hospitalization and ongoing recovery are:: pt is confused at this time CMS Medicare.gov Compare Post Acute Care list provided to:: Patient Choice offered to / list presented to : Patient  Expected Discharge Plan and Services Expected Discharge Plan: [place]   Discharge Planning Services: CM Consult   Living arrangements for the past 2 months: [place]                 DME Arranged: N/A DME Agency: NA       HH Arranged: NA HH Agency: NA        Prior Living Arrangements/Services Living arrangements for the past 2 months: [place] Lives with:: Facility Resident Patient language and need for interpreter reviewed:: Yes Do you feel safe going back to the place where you live?: Yes      Need for Family Participation in Patient Care: Yes (Comment) Care giver support system in place?: Yes (comment) Current home services: DME Criminal Activity/Legal Involvement Pertinent to  Current Situation/Hospitalization: No - Comment as needed  Activities of Daily Living Home Assistive Devices/Equipment: Other (Comment) (foley catheter-[facility] has necessary equipment for their residents) ADL Screening (condition at time of admission) Patient's cognitive ability adequate to safely complete daily activities?: No (patient very lethargic) Is the patient deaf or have difficulty hearing?: No Does the patient have difficulty seeing, even when wearing glasses/contacts?: No Does the patient have difficulty concentrating, remembering, or making decisions?: Yes Patient able to express need for assistance with ADLs?: No Does the patient have difficulty dressing or bathing?: Yes Independently performs ADLs?: No Communication: Independent Dressing (OT): Dependent Is this a change from baseline?: Change from baseline, expected to last >3 days Grooming: Dependent Is this a change from baseline?: Change from baseline, expected to last >3 days Feeding: Dependent Is this a change from baseline?: Change from baseline, expected to last >3 days Bathing: Dependent Is this a change from baseline?: Change from baseline, expected to last >3 days Toileting: Dependent Is this a change from baseline?: Change from baseline, expected to last >3days In/Out Bed: Dependent Is this a change from baseline?: Change from baseline, expected to last >3 days Walks in Home: Dependent Is this a change from baseline?: Change from baseline, expected to last >3 days Does the patient have difficulty walking or climbing stairs?: Yes (secondary to weakness and lethargy) Weakness of Legs: Both Weakness of Arms/Hands: Both  Permission Sought/Granted Permission sought to share information with : Guardian  Share Information with NAME: Lee House  Permission granted to share info w AGENCY: [place] granted to share info w Relationship: Uncle  Permission granted to share info w  Contact Information: [phone]  Emotional Assessment Appearance:: Appears stated age Attitude/Demeanor/Rapport: Unable to Assess Affect (typically observed): Calm Orientation: : Fluctuating Orientation (Suspected and/or reported [place]) (updated on 092021 at 1424) Alcohol / Substance Use: Not Applicable Psych Involvement: No (comment)  Admission diagnosis:  Dehydration [E86.0] Acute cystitis with hematuria [N30.01] AKI (acute kidney injury) ([place]) [N17.9] Sepsis secondary to UTI ([place]) [A41.9, N39.0] Sepsis ([place]) [A41.9] Patient Active Problem List   Diagnosis Date Noted  . Bacteremia due to Gram-negative bacteria 01/05/2020  . Acute lower UTI 01/05/2020  . Acute renal failure ([place])   . Nausea & vomiting   . Ileus ([place])   . Bipolar affective disorder, current episode depressed ([place])   . Chronic obstructive pulmonary disease ([place])   . Obstructive uropathy   . Sepsis secondary to UTI ([place]) 01/04/2020  . Sepsis ([place]) 09/30/2019  . Catheter-associated urinary tract infection ([place]) 09/29/2019  . Neurogenic bladder 09/29/2019  . AMS (altered mental status) 09/09/2019  . Lower urinary tract infectious disease 09/09/2019  . Schizophrenia ([place]) 09/09/2019  . COPD with acute bronchitis ([place]) 09/09/2019  . Distal radial fracture 02/27/2012  . Anemia 06/02/2011  . Cellulitis and abscess of leg 06/01/2011  . Altered mental status 06/01/2011  . Thrombocytopenia ([place]) 06/01/2011   PCP:  Patient, [name] Per Pharmacy:   [facility], [place] [address] Phone: [phone] Fax: [phone]     Social Determinants of Health (SDOH) Interventions    Readmission Risk Interventions Readmission Risk Prevention Plan 09/12/2019  Post Dischage Appt Complete  Medication Screening Complete  Transportation Screening Complete  Some recent data might be hidden

## 2020-01-09 NOTE — Progress Notes (Addendum)
PROGRESS NOTE   Lee House  VEL:[id number]    DOB: Sep 16, 1965    DOA: 01/04/2020  PCP: Patient, [name] Per   I have briefly reviewed patients previous medical [facility] in [facility].  Chief Complaint  Patient presents with  . Fatigue  . Tachycardia    Brief Narrative:  54 year old male with PMH of asthma, COPD, GERD, bipolar disorder, schizoaffective disorder, neurogenic bladder with chronic indwelling Foley catheter, recurrent UTIs with Pseudomonas, Enterobacter, admitted with urosepsis complicating obstructive uropathy.  Resuscitated with aggressive IV fluids and broad-spectrum IV antibiotics.  Hypoxic initially which has resolved.  Chronic Foley catheter was obstructed and replaced.  PCCM had consulted for intermittent hypotension.   Assessment & Plan:  Principal Problem:   Sepsis ([place]) Active Problems:   Neurogenic bladder   Sepsis secondary to UTI ([place])   Bacteremia due to Gram-negative bacteria   Acute lower UTI   Acute renal failure (HCC)   Nausea & vomiting   Ileus (HCC)   Bipolar affective disorder, current episode depressed (HCC)   Chronic obstructive pulmonary disease (HCC)   Obstructive uropathy   Sepsis secondary to Providencia rettgeri bacteremia and recurrent UTI, POA: Met sepsis criteria on admission with elevated lactate, hypotension, acute renal failure, leukocytosis, fever with temperature of 101.4, tachycardia and tachypnea.  Treated per sepsis protocol with aggressive IV fluids and broad-spectrum IV antibiotics.  Blood and urine cultures from 9/18 confirmed Providencia rettgeri.  Per ID follow-up, 1 week of cefepime recommended.  Sepsis physiology resolved.  Day 6/7 cefepime today.  Could possibly return to his prior SNF after day 7 cefepime tomorrow.  Acute renal failure secondary to obstructive uropathy: Foley catheter was replaced with resolution of acute renal failure.  Neurogenic bladder with chronic Foley dependent Foley catheter  replaced this admission.  Will need diligent care of his Foley catheter to prevent recurrent UTIs.  Bipolar disorder/schizophrenia Stable.  Continue Zyprexa, Depakote, and Risperdal.  Reviewed patient's home meds and resumed Cogentin.  Abilify is listed but reportedly not on MAR and hence not started here.  As per RN reports, patient reportedly sleeps quite a bit at baseline.  Some of his meds were briefly held this morning due to sedation.  Acute metabolic encephalopathy: Likely secondary to sepsis.  Continues to gradually improve.  Baseline mental status not clearly known.  COPD/asthma: Stable without clinical bronchospasm.  Not hypoxic on room air currently  Probable ileus/nausea and vomiting/constipation Abdominal x-rays on 9/18 showed dilated loops of bowel.  He was treated conservatively with bowel rest, NG tube decompression, bowel regimen/enema with good effect, NG tube was clamped, diet was gradually advanced which she has tolerated and NG tube was removed 9/22.  As per speech therapy input, no true concerns for dysphagia but patient counseled to eat slowly and chew well.    Hypokalemia Replaced.  Anemia Possibly due to critical illness.  Stable.  Thrombocytopenia Likely due to acute illness and infectious etiology.  Resolved.   Body mass index is 24.26 kg/m.   DVT prophylaxis: heparin injection 5,000 Units Start: 01/04/20 1000 SCDs Start: 01/04/20 2585     Code Status: Full Code  Family Communication: I discussed in detail with patient's aunt via phone, updated care and answered questions.  Her husband is patient's guardian but he is himself unwell and hence she discussed with me instead. Disposition:  Status is: Inpatient  Remains inpatient appropriate because: Severity of illness.   Dispo: The patient is from: Home  Anticipated d/c is to: Home              Anticipated d/c date is: Possibly 01/10/2020.              Patient currently is not medically  stable to d/c.  After completing day 7 of IV cefepime and further improvement in mental status.        Consultants:   Infectious disease.  Procedures:   Foley catheter changed this admission.  Antimicrobials:    Anti-infectives (From admission, onward)   Start     Dose/Rate Route Frequency Ordered Stop   01/05/20 1400  vancomycin (VANCOCIN) IVPB 1000 mg/200 mL premix  Status:  Discontinued        1,000 mg 200 mL/hr over 60 Minutes Intravenous Every 8 hours 01/05/20 0839 01/05/20 1057   01/05/20 1200  ceFEPIme (MAXIPIME) 2 g in sodium chloride 0.9 % 100 mL IVPB        2 g 200 mL/hr over 30 Minutes Intravenous Every 8 hours 01/05/20 0828 01/12/20 1359   01/05/20 0500  vancomycin (VANCOCIN) IVPB 1000 mg/200 mL premix  Status:  Discontinued        1,000 mg 200 mL/hr over 60 Minutes Intravenous Every 24 hours 01/04/20 0947 01/05/20 0839   01/04/20 1400  ceFEPIme (MAXIPIME) 2 g in sodium chloride 0.9 % 100 mL IVPB  Status:  Discontinued        2 g 200 mL/hr over 30 Minutes Intravenous Every 12 hours 01/04/20 0846 01/05/20 0828   01/04/20 0145  aztreonam (AZACTAM) 2 g in sodium chloride 0.9 % 100 mL IVPB  Status:  Discontinued        2 g 200 mL/hr over 30 Minutes Intravenous  Once 01/04/20 0137 01/04/20 0140   01/04/20 0145  metroNIDAZOLE (FLAGYL) IVPB 500 mg        500 mg 100 mL/hr over 60 Minutes Intravenous  Once 01/04/20 0137 01/04/20 0455   01/04/20 0145  vancomycin (VANCOCIN) IVPB 1000 mg/200 mL premix  Status:  Discontinued        1,000 mg 200 mL/hr over 60 Minutes Intravenous  Once 01/04/20 0137 01/04/20 0139   01/04/20 0145  vancomycin (VANCOREADY) IVPB 1500 mg/300 mL        1,500 mg 150 mL/hr over 120 Minutes Intravenous  Once 01/04/20 0139 01/04/20 0530   01/04/20 0145  ceFEPIme (MAXIPIME) 2 g in sodium chloride 0.9 % 100 mL IVPB        2 g 200 mL/hr over 30 Minutes Intravenous  Once 01/04/20 0140 01/04/20 0300        Subjective:  Sleeping soundly this morning.   Barely arousable.  On calling repeatedly, just move his head around and went back to sleep.  As per RN, no acute issues noted and was informed that patient at baseline sleeps a lot.  No acute issues reported.  Objective:   Vitals:   01/08/20 2116 01/09/20 0611 01/09/20 0931 01/09/20 0933  BP: 120/66 124/65    Pulse: 99 97    Resp: 18 18    Temp: 99.5 F (37.5 C) 98.1 F (36.7 C)    TempSrc: Oral     SpO2: 96% 94% 93% 93%  Weight:      Height:        General exam: Pleasant young male, moderately built and nourished lying comfortably propped up in bed without distress.  Looks quite comfortable and in no distress. Respiratory system: Clear to auscultation. Respiratory effort normal. Cardiovascular  system: S1 & S2 heard, RRR. No JVD, murmurs, rubs, gallops or clicks. No pedal edema.  Stable. Gastrointestinal system: Abdomen is nondistended, soft and nontender.  No organomegaly or masses appreciated.  Normal bowel sounds heard. Central nervous system: Mental status as noted above. No focal neurological deficits. Extremities: Moves all limbs symmetrically Skin: No rashes, lesions or ulcers Psychiatry: Judgement and insight cannot be assessed today. Mood & affect, currently sleeping.     Data Reviewed:   I have personally reviewed following labs and imaging studies   CBC: Recent Labs  Lab 01/04/20 0108 01/05/20 0314 01/06/20 0312 01/06/20 0312 01/07/20 0250 01/08/20 0256 01/09/20 0340  WBC 13.8*   < > 7.8   < > 9.5 10.4 12.7*  NEUTROABS 12.1*  --  6.9  --   --  7.0  --   HGB 15.0   < > 11.4*   < > 10.8* 11.8* 10.4*  HCT 44.6   < > 36.2*   < > 33.1* 35.6* 31.6*  MCV 81.8   < > 86.0   < > 82.8 82.8 81.9  PLT 146*   < > 108*   < > 114* 140* 169   < > = values in this interval not displayed.    Basic Metabolic Panel: Recent Labs  Lab 01/05/20 0314 01/05/20 0314 01/06/20 0312 01/06/20 0312 01/07/20 0250 01/08/20 0256 01/09/20 0340  NA 137   < > 135   < > 136 131*  134*  K 4.8   < > 3.6   < > 3.1* 3.5 3.7  CL 107   < > 105   < > 104 100 101  CO2 23   < > 21*   < > _0 GLUCOSE 95   < > 99   < > 104* 137* 107*  BUN 28*   < > 17   < > _1 CREATININE 0.94   < > 0.65   < > 0.60* 0.62 0.58*  CALCIUM 7.9*   < > 7.7*   < > 7.5* 7.4* 7.3*  MG 1.9  --  2.0  --   --  2.1  --   PHOS 2.6  --  2.8  --   --   --   --    < > = values in this interval not displayed.    Liver Function Tests: Recent Labs  Lab 01/04/20 0108 01/06/20 0312  AST 29  --   ALT 13  --   ALKPHOS 74  --   BILITOT 0.7  --   PROT 6.7  --   ALBUMIN 3.3* 2.2*    CBG: No results for input(s): GLUCAP in the last 168 hours.  Microbiology Studies:   Recent Results (from the past 240 hour(s))  Culture, blood (Routine x 2)     Status: Abnormal   Collection Time: 01/04/20  2:06 AM   Specimen: BLOOD RIGHT HAND  Result Value Ref Range Status   Specimen Description   Final    BLOOD RIGHT HAND Performed at [facility], [place] [address], [place], [place] [address]    Special Requests   Final    BOTTLES DRAWN AEROBIC AND ANAEROBIC Blood Culture results may not be optimal due to an inadequate volume of blood received in culture bottles Performed at [facility] [address], [place], [place] [address]    Culture  Setup Time   Final    GRAM NEGATIVE  RODS IN BOTH AEROBIC AND ANAEROBIC BOTTLES Organism ID to follow CRITICAL RESULT CALLED TO, READ BACK BY AND VERIFIED WITH: PHRMD E JACKSON _0  01/04/20 BY S GEZAHEGN Performed at [facility], [place] [address], [place], [place] [address]    Culture PROVIDENCIA RETTGERI (A)  Final   Report Status 01/06/2020 FINAL  Final   Organism ID, Bacteria PROVIDENCIA RETTGERI  Final      Susceptibility   Providencia rettgeri - MIC*    AMPICILLIN RESISTANT Resistant     CEFAZOLIN RESISTANT Resistant     CEFEPIME <=0.12 SENSITIVE Sensitive     CEFTAZIDIME <=1 SENSITIVE Sensitive      CEFTRIAXONE <=0.25 SENSITIVE Sensitive     CIPROFLOXACIN <=0.25 SENSITIVE Sensitive     GENTAMICIN <=1 SENSITIVE Sensitive     IMIPENEM 1 SENSITIVE Sensitive     TRIMETH/SULFA <=20 SENSITIVE Sensitive     AMPICILLIN/SULBACTAM <=2 SENSITIVE Sensitive     PIP/TAZO <=4 SENSITIVE Sensitive     * PROVIDENCIA RETTGERI  SARS Coronavirus 2 by RT PCR (hospital order, performed in [place] hospital lab) Nasopharyngeal Nasopharyngeal Swab     Status: None   Collection Time: 01/04/20  2:06 AM   Specimen: Nasopharyngeal Swab  Result Value Ref Range Status   SARS Coronavirus 2 NEGATIVE NEGATIVE Final    Comment: (NOTE) SARS-CoV-2 target nucleic acids are NOT DETECTED.  The SARS-CoV-2 RNA is generally detectable in upper and lower respiratory specimens during the acute phase of infection. The lowest concentration of SARS-CoV-2 viral copies this assay can detect is 250 copies / mL. A negative result does not preclude SARS-CoV-2 infection and should not be used as the sole basis for treatment or other patient management decisions.  A negative result may occur with improper specimen collection / handling, submission of specimen other than nasopharyngeal swab, presence of viral mutation(s) within the areas targeted by this assay, and inadequate number of viral copies (<250 copies / mL). A negative result must be combined with clinical observations, patient history, and epidemiological information.  Fact Sheet for Patients:   StrictlyIdeas.no  Fact Sheet for Healthcare Providers: BankingDealers.co.za  This test is not yet approved or  cleared by the [place] FDA and has been authorized for detection and/or diagnosis of SARS-CoV-2 by FDA under an Emergency Use Authorization (EUA).  This EUA will remain in effect (meaning this test can be used) for the duration of the COVID-19 declaration under Section 564(b)(1) of the Act, 21 U.S.C. section  360bbb-3(b)(1), unless the authorization is terminated or revoked sooner.  Performed at [facility], [place] [address], [place], [place] [address]   Urine culture     Status: Abnormal   Collection Time: 01/04/20  2:06 AM   Specimen: Urine, Random  Result Value Ref Range Status   Specimen Description   Final    URINE, RANDOM Performed at [place] [address] [place], [place] [address]    Special Requests   Final    NONE Performed at [facility] [place] [address], [place], [place] [address]    Culture >=100,000 COLONIES/mL PROVIDENCIA RETTGERI (A)  Final   Report Status 01/07/2020 FINAL  Final   Organism ID, Bacteria PROVIDENCIA RETTGERI (A)  Final      Susceptibility   Providencia rettgeri - MIC*    AMPICILLIN RESISTANT Resistant     CEFAZOLIN >=64 RESISTANT Resistant     CEFTRIAXONE <=0.25 SENSITIVE Sensitive     CIPROFLOXACIN <=0.25 SENSITIVE Sensitive  GENTAMICIN <=1 SENSITIVE Sensitive     IMIPENEM 1 SENSITIVE Sensitive     NITROFURANTOIN 256 RESISTANT Resistant     TRIMETH/SULFA <=20 SENSITIVE Sensitive     AMPICILLIN/SULBACTAM 16 INTERMEDIATE Intermediate     PIP/TAZO <=4 SENSITIVE Sensitive     * >=100,000 COLONIES/mL PROVIDENCIA RETTGERI  Blood Culture ID Panel (Reflexed)     Status: Abnormal   Collection Time: 01/04/20  2:06 AM  Result Value Ref Range Status   Enterococcus faecalis NOT DETECTED NOT DETECTED Final   Enterococcus Faecium NOT DETECTED NOT DETECTED Final   Listeria monocytogenes NOT DETECTED NOT DETECTED Final   Staphylococcus species NOT DETECTED NOT DETECTED Final   Staphylococcus aureus (BCID) NOT DETECTED NOT DETECTED Final   Staphylococcus epidermidis NOT DETECTED NOT DETECTED Final   Staphylococcus lugdunensis NOT DETECTED NOT DETECTED Final   Streptococcus species NOT DETECTED NOT DETECTED Final   Streptococcus agalactiae NOT DETECTED NOT DETECTED Final   Streptococcus  pneumoniae NOT DETECTED NOT DETECTED Final   Streptococcus pyogenes NOT DETECTED NOT DETECTED Final   A.calcoaceticus-baumannii NOT DETECTED NOT DETECTED Final   Bacteroides fragilis NOT DETECTED NOT DETECTED Final   Enterobacterales DETECTED (A) NOT DETECTED Final    Comment: Enterobacterales represent a large order of gram negative bacteria, not a single organism. Refer to culture for further identification. CRITICAL RESULT CALLED TO, READ BACK BY AND VERIFIED WITH: PHRMD E JACKSON _0  01/04/20 BY S GEZAHEGN    Enterobacter cloacae complex NOT DETECTED NOT DETECTED Final   Escherichia coli NOT DETECTED NOT DETECTED Final   Klebsiella aerogenes NOT DETECTED NOT DETECTED Final   Klebsiella oxytoca NOT DETECTED NOT DETECTED Final   Klebsiella pneumoniae NOT DETECTED NOT DETECTED Final   Proteus species NOT DETECTED NOT DETECTED Final   Salmonella species NOT DETECTED NOT DETECTED Final   Serratia marcescens NOT DETECTED NOT DETECTED Final   Haemophilus influenzae NOT DETECTED NOT DETECTED Final   Neisseria meningitidis NOT DETECTED NOT DETECTED Final   Pseudomonas aeruginosa NOT DETECTED NOT DETECTED Final   Stenotrophomonas maltophilia NOT DETECTED NOT DETECTED Final   Candida albicans NOT DETECTED NOT DETECTED Final   Candida auris NOT DETECTED NOT DETECTED Final   Candida glabrata NOT DETECTED NOT DETECTED Final   Candida krusei NOT DETECTED NOT DETECTED Final   Candida parapsilosis NOT DETECTED NOT DETECTED Final   Candida tropicalis NOT DETECTED NOT DETECTED Final   Cryptococcus neoformans/gattii NOT DETECTED NOT DETECTED Final   CTX-M ESBL NOT DETECTED NOT DETECTED Final   Carbapenem resistance IMP NOT DETECTED NOT DETECTED Final   Carbapenem resistance KPC NOT DETECTED NOT DETECTED Final   Carbapenem resistance NDM NOT DETECTED NOT DETECTED Final   Carbapenem resist OXA 48 LIKE NOT DETECTED NOT DETECTED Final   Carbapenem resistance VIM NOT DETECTED NOT DETECTED Final     Comment: Performed at [facility], [address], [place], [place] [address]  Culture, blood (Routine x 2)     Status: Abnormal   Collection Time: 01/04/20  2:12 AM   Specimen: BLOOD LEFT HAND  Result Value Ref Range Status   Specimen Description   Final    BLOOD LEFT HAND Performed at [place] [address], [place], [place] [address]    Special Requests   Final    BOTTLES DRAWN AEROBIC AND ANAEROBIC Blood Culture results may not be optimal due to an inadequate volume of blood received in culture bottles Performed at [place] [address]  [place], [place] [address]    Culture  Setup Time   Final    GRAM NEGATIVE RODS IN BOTH AEROBIC AND ANAEROBIC BOTTLES CRITICAL VALUE NOTED.  VALUE IS CONSISTENT WITH PREVIOUSLY REPORTED AND CALLED VALUE.    Culture (A)  Final    PROVIDENCIA RETTGERI SUSCEPTIBILITIES PERFORMED ON PREVIOUS CULTURE WITHIN THE LAST 5 DAYS. Performed at [facility], [place] [address], [place], [place] [address]    Report Status 01/06/2020 FINAL  Final  MRSA PCR Screening     Status: Abnormal   Collection Time: 01/04/20  9:53 AM   Specimen: Nasal Mucosa; Nasopharyngeal  Result Value Ref Range Status   MRSA by PCR POSITIVE (A) NEGATIVE Final    Comment:        The GeneXpert MRSA Assay (FDA approved for NASAL specimens only), is one component of a comprehensive MRSA colonization surveillance program. It is not intended to diagnose MRSA infection nor to guide or monitor treatment for MRSA infections. RESULT CALLED TO, READ BACK BY AND VERIFIED WITH: HOUSE,J. RN AT 1225 01/04/20 MULLINS,T Performed at [facility], [place] [address], [place], [place] [address]      Radiology Studies:  No results found.   Scheduled Meds:   . aspirin EC  81 mg Oral Daily  . benztropine  0.5 mg Oral BID  . chlorhexidine  15 mL Mouth Rinse BID  . Chlorhexidine Gluconate Cloth  6 each Topical  Daily  . divalproex  250 mg Oral Q8H  . heparin  5,000 Units Subcutaneous Q8H  . mouth rinse  15 mL Mouth Rinse q12n4p  . mometasone-formoterol  2 puff Inhalation BID  . OLANZapine  10 mg Oral BID  . pantoprazole  40 mg Oral Daily  . polyethylene glycol  17 g Oral Daily  . risperiDONE  2 mg Oral Daily    Continuous Infusions:   . ceFEPime (MAXIPIME) IV 2 g (01/09/20 1402)     LOS: 5 days     Vernell Leep, MD, [place], [facility]. [facility]    To contact the attending provider between 7A-7P or the covering provider during after hours 7P-7A, please log into the web site [email or web address] and access using universal [place] password for that web site. If you do not have the password, please call the hospital operator.  01/09/2020, 2:32 PM

## 2020-01-09 NOTE — NC FL2 (Signed)
[place] LEVEL OF CARE SCREENING TOOL     IDENTIFICATION  Patient Name: Lee House Birthdate: 03-07-1966 Sex: male Admission Date (Current Location): 01/04/2020  [facility] and [place] Number:  Herbalist and Address:  [facility],  [address] [place], [place]      Provider Number: [id number]  Attending Physician Name and Address:  Modena Jansky, MD  Relative Name and Phone Number:  [place], Shimabukuro Wisner Other [phone]    Current Level of Care: Hospital Recommended Level of Care: [place] Prior Approval Number:    Date Approved/Denied:   PASRR Number:    Discharge Plan: SNF    Current Diagnoses: Patient Active Problem List   Diagnosis Date Noted   Bacteremia due to Gram-negative bacteria 01/05/2020   Acute lower UTI 01/05/2020   Acute renal failure (HCC)    Nausea & vomiting    Ileus (HCC)    Bipolar affective disorder, current episode depressed ([place])    Chronic obstructive pulmonary disease ([place])    Obstructive uropathy    Sepsis secondary to UTI ([place]) 01/04/2020   Sepsis ([place]) 09/30/2019   Catheter-associated urinary tract infection ([place]) 09/29/2019   Neurogenic bladder 09/29/2019   AMS (altered mental status) 09/09/2019   Lower urinary tract infectious disease 09/09/2019   Schizophrenia ([place]) 09/09/2019   COPD with acute bronchitis ([place]) 09/09/2019   Distal radial fracture 02/27/2012   Anemia 06/02/2011   Cellulitis and abscess of leg 06/01/2011   Altered mental status 06/01/2011   Thrombocytopenia ([place]) 06/01/2011    Orientation RESPIRATION BLADDER Height & Weight     Self, Place  Normal Indwelling catheter, Incontinent Weight: 183 lb 13.8 oz (83.4 kg) Height:  6\' 1"  (185.4 cm)  BEHAVIORAL SYMPTOMS/MOOD NEUROLOGICAL BOWEL NUTRITION STATUS      Continent Diet (See discharge summary)  AMBULATORY STATUS COMMUNICATION OF NEEDS Skin    Extensive Assist Verbally Normal                       Personal Care Assistance Level of Assistance  Bathing, Feeding, Dressing Bathing Assistance: Maximum assistance Feeding assistance: Limited assistance Dressing Assistance: Maximum assistance     Functional Limitations Info  Sight, Hearing, Speech Sight Info: Adequate Hearing Info: Adequate Speech Info: Adequate    SPECIAL CARE FACTORS FREQUENCY  PT (By licensed PT), OT (By licensed OT)     PT Frequency: 5x/week OT Frequency: 5x/week            Contractures Contractures Info: Not present    Additional Factors Info  Code Status, Allergies, Psychotropic Code Status Info: Fullcode Allergies Info: Allergies: Penicillins Psychotropic Info: Depakote, Zyprexa, Risperdal         Current Medications (01/09/2020):  This is the current hospital active medication list Current Facility-Administered Medications  Medication Dose Route Frequency Provider Last Rate Last Admin   acetaminophen (TYLENOL) tablet 650 mg  650 mg Oral Q6H PRN Hongalgi, Lenis Dickinson, MD       bisacodyl (DULCOLAX) suppository 10 mg  10 mg Rectal Daily PRN Hongalgi, Lenis Dickinson, MD       ceFEPIme (MAXIPIME) 2 g in sodium chloride 0.9 % 100 mL IVPB  2 g Intravenous Q8H Campbell Riches, MD 200 mL/hr at 01/09/20 0516 2 g at 01/09/20 0516   chlorhexidine (PERIDEX) 0.12 % solution 15 mL  15 mL Mouth Rinse BID Eugenie Filler, MD   15  mL at 01/08/20 2223   Chlorhexidine Gluconate Cloth 2 % PADS 6 each  6 each Topical Daily Mannam, Praveen, MD   6 each at 01/09/20 0850   divalproex (DEPAKOTE) DR tablet 250 mg  250 mg Oral Q8H BellRonaldo Miyamoto, RPH   250 mg at 01/09/20 0514   docusate (COLACE) 50 MG/5ML liquid 200 mg  200 mg Oral BID PRN Mannam, Praveen, MD       heparin injection 5,000 Units  5,000 Units Subcutaneous Q8H Mannam, Praveen, MD   5,000 Units at 01/09/20 0516   lactated ringers infusion   Intravenous Continuous Modena Jansky, MD    Stopped at 01/08/20 1115   MEDLINE mouth rinse  15 mL Mouth Rinse q12n4p Eugenie Filler, MD   15 mL at 01/08/20 1552   mometasone-formoterol (DULERA) 200-5 MCG/ACT inhaler 2 puff  2 puff Inhalation BID Mannam, Praveen, MD   2 puff at 01/09/20 0931   OLANZapine (ZYPREXA) tablet 10 mg  10 mg Oral BID Leodis Sias T, RPH   10 mg at 01/09/20 0235   ondansetron (ZOFRAN) injection 4 mg  4 mg Intravenous Q8H PRN Mannam, Praveen, MD   4 mg at 01/04/20 1415   pantoprazole (PROTONIX) EC tablet 40 mg  40 mg Oral Daily Leodis Sias T, RPH   40 mg at 01/09/20 3810   polyethylene glycol (MIRALAX / GLYCOLAX) packet 17 g  17 g Oral Daily Hongalgi, Lenis Dickinson, MD       risperiDONE (RISPERDAL) tablet 2 mg  2 mg Oral Daily Eudelia Bunch, [facility]         Discharge Medications: Please see discharge summary for a list of discharge medications.  Relevant Imaging Results:  Relevant Lab Results:   Additional Information SS# [id number]  Lia Hopping, LCSW

## 2020-01-10 LAB — RESPIRATORY PANEL BY RT PCR (FLU A&B, COVID)
Influenza A by PCR: NEGATIVE
Influenza B by PCR: NEGATIVE
SARS Coronavirus 2 by RT PCR: NEGATIVE

## 2020-01-10 MED ORDER — POLYETHYLENE GLYCOL 3350 17 G PO PACK: 17.0000 g | PACK | Freq: Every day | ORAL | Status: AC

## 2020-01-10 MED ORDER — RISPERIDONE 4 MG PO TABS: 2.0000 mg | ORAL_TABLET | Freq: Every day | ORAL | Status: AC

## 2020-01-10 NOTE — Plan of Care (Signed)

## 2020-01-10 NOTE — Progress Notes (Signed)
Patient report attempted call- left name and number with supervisor at [email or web address] would get a call back. Patient is being dc in stable condition. Denies pain or discomfort, all vitals are WNL.

## 2020-01-10 NOTE — Progress Notes (Signed)
Report called to Barbera Setters, Nurse at [facility]

## 2020-01-10 NOTE — Discharge Summary (Signed)
Physician Discharge Summary  Lee House GEX:[id number] DOB: Aug 30, 1965  PCP: Patient, [name] Per  Admitted from: SNF Discharged to: SNF  Admit date: 01/04/2020 Discharge date: 01/10/2020  Recommendations for Outpatient Follow-up:    Contact information for follow-up providers    Bjorn Loser, MD. Schedule an appointment as soon as possible for a visit in 1 week(s).   Specialty: Urology Contact information: [place] [address] [phone]        MD at SNF. Schedule an appointment as soon as possible for a visit.   Why: To be seen in 2 to 3 days with repeat labs (CBC & CMP).  Recommend psychiatric consultation at SNF to reassess and also to optimize his medications.  Recommend urology consultation and follow-up for chronic indwelling Foley catheter.           Contact information for after-discharge care    Destination    [facility] .   Service: Skilled Nursing Contact information: [place] [phone]                 MD at SNF to carefully reassess patient's medications.  For unclear reasons patient is on aspirin 81 mg twice daily from PTA, if no clear indication then consider changing to once daily.  Also was on Demadex PTA for edema and hypertension, dose apparently had been reduced at SNF due to low blood pressures, held during hospitalization and discontinued at discharge because he is clinically euvolemic and without leg edema and blood pressures are controlled.  This can be reassessed at SNF.  Some of his pain meds were also discontinued because there were no reported pain issues in the hospital.  Home Health: N/A Equipment/Devices: TBD at SNF  Discharge Condition: Improved and stable. CODE STATUS: Full Diet recommendation: Heart healthy diet.  Discharge Diagnoses:  Principal Problem:   Sepsis ([place]) Active Problems:   Neurogenic bladder   Sepsis  secondary to UTI ([place])   Bacteremia due to Gram-negative bacteria   Acute lower UTI   Acute renal failure (HCC)   Nausea & vomiting   Ileus (HCC)   Bipolar affective disorder, current episode depressed (HCC)   Chronic obstructive pulmonary disease (HCC)   Obstructive uropathy   Brief Summary: 54 year old male with PMH of asthma, COPD, GERD, bipolar disorder, schizoaffective disorder, neurogenic bladder with chronic indwelling Foley catheter, recurrent UTIs with Pseudomonas, Enterobacter, admitted with urosepsis complicating obstructive uropathy.  Resuscitated with aggressive IV fluids and broad-spectrum IV antibiotics.  Hypoxic initially which has resolved.  Chronic Foley catheter was obstructed and replaced.  PCCM had consulted for intermittent hypotension.   Assessment & Plan:   Sepsis secondary to Providencia rettgeri bacteremia and recurrent UTI, POA: Met sepsis criteria on admission with elevated lactate, hypotension, acute renal failure, leukocytosis, fever with temperature of 101.4, tachycardia and tachypnea.  Treated per sepsis protocol with aggressive IV fluids and broad-spectrum IV antibiotics.  Blood and urine cultures from 9/18 confirmed Providencia rettgeri.  Per ID follow-up, 1 week of cefepime recommended.  Sepsis physiology resolved.  Day 7/7 cefepime today.  No further antibiotics at discharge.  However he remains at high risk for recurrent UTI and related sepsis due to Foley catheter.  Thereby recommend strict and appropriate Foley catheter hygiene and care and outpatient urology consultation and follow-up.  Acute renal failure secondary to obstructive uropathy: Foley catheter was replaced with resolution of acute renal failure.  Neurogenic bladder with chronic Foley dependent  Foley catheter replaced this admission.  Will need diligent care of his Foley catheter to prevent recurrent UTIs.  Bipolar disorder/schizophrenia Stable.  Continue Zyprexa, Depakote, Risperdal  and Cogentin.  Abilify is listed but reportedly not on MAR and hence not started here and discontinued at discharge.    Patient was somnolent yesterday morning during rounds which may have been because he got his Zyprexa dose at 2:35 AM the night before.  Alert and oriented x2 today.  He is on psychiatric polypharmacy prior to admission suggesting significant illness and needs close follow-up with outpatient psychiatry.  No A/V hallucinations, delusions or suicidal or homicidal ideations.  Acute metabolic encephalopathy: Likely secondary to sepsis.  Seems to have resolved and likely back to baseline.  At baseline patient apparently sleeps quite a lot.  COPD/asthma: Stable without clinical bronchospasm.  Not hypoxic on room air currently  Probable ileus/nausea and vomiting/constipation Abdominal x-rays on 9/18 showed dilated loops of bowel.  He was treated conservatively with bowel rest, NG tube decompression, bowel regimen/enema with good effect, NG tube was clamped, diet was gradually advanced which he has tolerated and NG tube was removed 9/22.  As per speech therapy input, no true concerns for dysphagia but patient counseled to eat slowly and chew well.  Resolved.  Hypokalemia Replaced.  Anemia Possibly due to critical illness.  Stable.  Follow CBC at SNF.  Thrombocytopenia Likely due to acute illness and infectious etiology.  Resolved.   Body mass index is 24.26 kg/m.  Constipation: Optimized bowel regimen and having BM's.      Consultants:   Infectious disease. PCCM  Procedures:   Foley catheter changed this admission.   Discharge Instructions  Discharge Instructions    Call MD for:   Complete by: As directed    Altered mental status.   Call MD for:  difficulty breathing, headache or visual disturbances   Complete by: As directed    Call MD for:  extreme fatigue   Complete by: As directed    Call MD for:  persistant dizziness or light-headedness    Complete by: As directed    Call MD for:  persistant nausea and vomiting   Complete by: As directed    Call MD for:  severe uncontrolled pain   Complete by: As directed    Call MD for:  temperature >100.4   Complete by: As directed    Diet - low sodium heart healthy   Complete by: As directed    Discharge instructions   Complete by: As directed    Appropriate Foley catheter care at SNF.   Increase activity slowly   Complete by: As directed        Medication List    STOP taking these medications   ARIPiprazole 10 MG tablet Commonly known as: ABILIFY   meloxicam 7.5 MG tablet Commonly known as: MOBIC   oxyCODONE 5 MG immediate release tablet Commonly known as: Oxy IR/ROXICODONE   torsemide 20 MG tablet Commonly known as: DEMADEX     TAKE these medications   acetaminophen 325 MG tablet Commonly known as: TYLENOL Take 2 tablets (650 mg total) by mouth every 6 (six) hours as needed for mild pain (or Fever >/= 101).   albuterol 108 (90 Base) MCG/ACT inhaler Commonly known as: VENTOLIN HFA Inhale 2 puffs into the lungs every 4 (four) hours as needed for shortness of breath.   ALPRAZolam 0.25 MG tablet Commonly known as: XANAX Take 1 tablet (0.25 mg total) by mouth 2 (two)  times daily as needed for anxiety. What changed: how much to take   aspirin EC 81 MG tablet Take 81 mg by mouth in the morning and at bedtime. Swallow whole.   benztropine 0.5 MG tablet Commonly known as: COGENTIN Take 0.5 mg by mouth 2 (two) times daily.   CALCIUM 500 +D PO Take 1 tablet by mouth daily.   Cerovite Senior Tabs Take 1 tablet by mouth daily.   divalproex 250 MG DR tablet Commonly known as: DEPAKOTE Take 250 mg by mouth 3 (three) times daily.   FeroSul 325 (65 FE) MG tablet Generic drug: ferrous sulfate Take 325 mg by mouth 2 (two) times daily with a meal.   Fluticasone-Salmeterol 250-50 MCG/DOSE Aepb Commonly known as: ADVAIR Inhale 1 puff into the lungs every 12  (twelve) hours.   OLANZapine 10 MG tablet Commonly known as: ZYPREXA Take 10 mg by mouth 2 (two) times daily.   pantoprazole 40 MG tablet Commonly known as: PROTONIX Take 40 mg by mouth daily.   polyethylene glycol 17 g packet Commonly known as: MIRALAX / GLYCOLAX Take 17 g by mouth daily. Start taking on: January 11, 2020   risperidone 4 MG tablet Commonly known as: RISPERDAL Take 0.5 tablets (2 mg total) by mouth daily.   senna-docusate 8.6-50 MG tablet Commonly known as: Senokot-S Take 1 tablet by mouth 2 (two) times daily as needed for constipation.   tamsulosin 0.4 MG Caps capsule Commonly known as: FLOMAX Take 0.4 mg by mouth daily.   traZODone 50 MG tablet Commonly known as: DESYREL Take 50 mg by mouth at bedtime as needed for sleep.      Allergies  Allergen Reactions  . Penicillins Anaphylaxis    Can take cephalosporins      Procedures/Studies: DG Abd 1 View  Result Date: 01/06/2020 CLINICAL DATA:  Recent bowel obstruction EXAM: ABDOMEN - 1 VIEW COMPARISON:  January 05, 2020 FINDINGS: Nasogastric tube tip is at the level of the first portion of the duodenum. There remain loops of dilated bowel in the mid abdomen without air-fluid levels. No free air. There is fairly diffuse stool in the colon. Postoperative change noted in the right proximal femur. IMPRESSION: Borderline dilatation of bowel in the mid abdomen potentially could represent a degree of ileus or enteritis. Bowel obstruction appear somewhat less likely. No free air. Fairly diffuse stool in colon. Electronically Signed   By: Lowella Grip III M.D.   On: 01/06/2020 09:09   DG Abd 1 View  Result Date: 01/05/2020 CLINICAL DATA:  Check nasogastric catheter placement EXAM: ABDOMEN - 1 VIEW COMPARISON:  Film from earlier in the same day. FINDINGS: Gastric catheter is coiled within the stomach in a similar position to that seen on earlier. Scattered large and small bowel gas is noted. No free air is  seen. IMPRESSION: Gastric catheter coiled within the stomach. Electronically Signed   By: Inez Catalina M.D.   On: 01/05/2020 21:20   DG Abd 1 View  Result Date: 01/05/2020 CLINICAL DATA:  Evaluate NG tube placement. EXAM: ABDOMEN - 1 VIEW COMPARISON:  01/04/2020 FINDINGS: NG tube tip and side port are both well below the level of the GE junction. The tip of the NG tube projects over the expected location of the distal stomach. Large stool burden identified within the colon with gaseous distension at the level of the splenic flexure. IMPRESSION: Satisfactory position of NG tube with tip projecting over the expected location of distal stomach. Electronically Signed   By:  Kerby Moors M.D.   On: 01/05/2020 05:41   DG Abd 1 View  Result Date: 01/04/2020 CLINICAL DATA:  Vomiting, urosepsis EXAM: ABDOMEN - 1 VIEW COMPARISON:  None. FINDINGS: Nonobstructive bowel gas pattern. Mild gaseous distension of the stomach. No gross free intraperitoneal gas. No organomegaly. Vascular calcifications noted within the pelvis. Right hip ORIF has been performed. Possible tiny right pleural effusion. IMPRESSION: 1. Mild gaseous distension of the stomach. Nonobstructive bowel gas pattern. Electronically Signed   By: Fidela Salisbury MD   On: 01/04/2020 18:57   US RENAL  Result Date: 01/04/2020 CLINICAL DATA:  Acute renal insufficiency. Chronic Foley catheter placement. EXAM: RENAL / URINARY TRACT ULTRASOUND COMPLETE COMPARISON:  September 29, 2019 FINDINGS: Right Kidney: Renal measurements: 12.1 x 4.7 x 5.3 cm = volume: 157 mL. Echogenicity within normal limits. No mass or hydronephrosis visualized. Left Kidney: Renal measurements: 12.7 x 4.6 x 5.6 cm = volume: 171 mL. Echogenicity within normal limits. No mass or hydronephrosis visualized. Bladder: Decompressed with a Foley catheter. Possible thickened bladder wall consistent with history of neurogenic bladder. Other: None. IMPRESSION: 1. The kidneys are normal in appearance. 2.  The bladder is decompressed with a Foley catheter limiting evaluation. Possible bladder wall thickening is again seen consistent with history. Electronically Signed   By: Dorise Bullion III M.D   On: 01/04/2020 18:54   DG Chest Port 1 View  Result Date: 01/04/2020 CLINICAL DATA:  Lethargy, tachycardia EXAM: PORTABLE CHEST 1 VIEW COMPARISON:  09/29/2019 FINDINGS: The heart size and mediastinal contours are within normal limits. Both lungs are clear. The visualized skeletal structures are unremarkable. IMPRESSION: No active disease. Electronically Signed   By: Randa Ngo M.D.   On: 01/04/2020 01:47   DG Abd 2 Views  Result Date: 01/07/2020 CLINICAL DATA:  Urosepsis EXAM: X-RAY ABDOMEN 2 VIEWS COMPARISON:  01/06/2020 FINDINGS: NG tube remains in the stomach. Gas throughout mildly distended large bowel with moderate stool burden. No small bowel distention to suggest obstruction. No organomegaly, free air or suspicious calcification. IMPRESSION: Moderate stool burden with mild gaseous distention of the colon. NG tube remains in the stomach. Electronically Signed   By: Rolm Baptise M.D.   On: 01/07/2020 08:53   DG Abd Portable 1V  Result Date: 01/04/2020 CLINICAL DATA:  Nasogastric tube placement EXAM: PORTABLE ABDOMEN - 1 VIEW COMPARISON:  4:56 p.m. FINDINGS: Nasogastric tube is been placed with its tip overlying the gastric antrum. The stomach has been largely decompressed. No other changes are seen. IMPRESSION: Nasogastric tube tip within the gastric antrum. Electronically Signed   By: Fidela Salisbury MD   On: 01/04/2020 19:00      Subjective: Patient awake, alert and oriented x2.  Sitting up and has finished eating a full banana and was seen drinking milk out of a small milk carton.  He proceeded to shake my hand.  He stated that he was doing "fine".  He denied complaints.  Specifically no pain, nausea or vomiting.  As per RN, no acute issues reported.  Discharge Exam:  Vitals:   01/09/20  1909 01/09/20 2141 01/10/20 0629 01/10/20 0908  BP:  123/64 130/69   Pulse:  (!) 105 (!) 101   Resp:  17 18   Temp:  99 F (37.2 C) 99.6 F (37.6 C)   TempSrc:  Oral Oral   SpO2: 96% 94% 94% 91%  Weight:   83.4 kg   Height:        General exam: Pleasant young male,  moderately built and nourished sitting up comfortably in bed.   Respiratory system: Clear to auscultation. Respiratory effort normal. Cardiovascular system: S1 & S2 heard, RRR. No JVD, murmurs, rubs, gallops or clicks. No pedal edema.   Gastrointestinal system: Abdomen is nondistended, soft and nontender.  No organomegaly or masses appreciated.  Normal bowel sounds heard. Central nervous system: Mental status as noted above. No focal neurological deficits. Extremities: Moves all limbs symmetrically Skin: No rashes, lesions or ulcers Psychiatry: Judgement and insight impaired. Mood & affect, pleasant and appropriate.     The results of significant diagnostics from this hospitalization (including imaging, microbiology, ancillary and laboratory) are listed below for reference.     Microbiology: Recent Results (from the past 240 hour(s))  Culture, blood (Routine x 2)     Status: Abnormal   Collection Time: 01/04/20  2:06 AM   Specimen: BLOOD RIGHT HAND  Result Value Ref Range Status   Specimen Description   Final    BLOOD RIGHT HAND Performed at [facility], [place] [address] [place], [place] [address]    Special Requests   Final    BOTTLES DRAWN AEROBIC AND ANAEROBIC Blood Culture results may not be optimal due to an inadequate volume of blood received in culture bottles Performed at [facility] [address], [place], [place] [address]    Culture  Setup Time   Final    GRAM NEGATIVE RODS IN BOTH AEROBIC AND ANAEROBIC BOTTLES Organism ID to follow CRITICAL RESULT CALLED TO, READ BACK BY AND VERIFIED WITH: PHRMD E JACKSON _0  01/04/20 BY S GEZAHEGN Performed at [facility], [place] [address] [place], [place] [address]    Culture PROVIDENCIA RETTGERI (A)  Final   Report Status 01/06/2020 FINAL  Final   Organism ID, Bacteria PROVIDENCIA RETTGERI  Final      Susceptibility   Providencia rettgeri - MIC*    AMPICILLIN RESISTANT Resistant     CEFAZOLIN RESISTANT Resistant     CEFEPIME <=0.12 SENSITIVE Sensitive     CEFTAZIDIME <=1 SENSITIVE Sensitive     CEFTRIAXONE <=0.25 SENSITIVE Sensitive     CIPROFLOXACIN <=0.25 SENSITIVE Sensitive     GENTAMICIN <=1 SENSITIVE Sensitive     IMIPENEM 1 SENSITIVE Sensitive     TRIMETH/SULFA <=20 SENSITIVE Sensitive     AMPICILLIN/SULBACTAM <=2 SENSITIVE Sensitive     PIP/TAZO <=4 SENSITIVE Sensitive     * PROVIDENCIA RETTGERI  SARS Coronavirus 2 by RT PCR (hospital order, performed in [place] hospital lab) Nasopharyngeal Nasopharyngeal Swab     Status: None   Collection Time: 01/04/20  2:06 AM   Specimen: Nasopharyngeal Swab  Result Value Ref Range Status   SARS Coronavirus 2 NEGATIVE NEGATIVE Final    Comment: (NOTE) SARS-CoV-2 target nucleic acids are NOT DETECTED.  The SARS-CoV-2 RNA is generally detectable in upper and lower respiratory specimens during the acute phase of infection. The lowest concentration of SARS-CoV-2 viral copies this assay can detect is 250 copies / mL. A negative result does not preclude SARS-CoV-2 infection and should not be used as the sole basis for treatment or other patient management decisions.  A negative result may occur with improper specimen collection / handling, submission of specimen other than nasopharyngeal swab, presence of viral mutation(s) within the areas targeted by this assay, and inadequate number of viral copies (<250 copies / mL). A negative result must be combined with clinical observations, patient history, and epidemiological information.  Fact Sheet for Patients:  StrictlyIdeas.no  Fact Sheet for Healthcare  Providers: BankingDealers.co.za  This test is not yet approved or  cleared by the [place] FDA and has been authorized for detection and/or diagnosis of SARS-CoV-2 by FDA under an Emergency Use Authorization (EUA).  This EUA will remain in effect (meaning this test can be used) for the duration of the COVID-19 declaration under Section 564(b)(1) of the Act, 21 U.S.C. section 360bbb-3(b)(1), unless the authorization is terminated or revoked sooner.  Performed at [facility], [place] [address] [place], [place] [address]   Urine culture     Status: Abnormal   Collection Time: 01/04/20  2:06 AM   Specimen: Urine, Random  Result Value Ref Range Status   Specimen Description   Final    URINE, RANDOM Performed at [facility] [address], [place], [place] [address]    Special Requests   Final    NONE Performed at [facility], [place] [address] [place], [place] [address]    Culture >=100,000 COLONIES/mL PROVIDENCIA RETTGERI (A)  Final   Report Status 01/07/2020 FINAL  Final   Organism ID, Bacteria PROVIDENCIA RETTGERI (A)  Final      Susceptibility   Providencia rettgeri - MIC*    AMPICILLIN RESISTANT Resistant     CEFAZOLIN >=64 RESISTANT Resistant     CEFTRIAXONE <=0.25 SENSITIVE Sensitive     CIPROFLOXACIN <=0.25 SENSITIVE Sensitive     GENTAMICIN <=1 SENSITIVE Sensitive     IMIPENEM 1 SENSITIVE Sensitive     NITROFURANTOIN 256 RESISTANT Resistant     TRIMETH/SULFA <=20 SENSITIVE Sensitive     AMPICILLIN/SULBACTAM 16 INTERMEDIATE Intermediate     PIP/TAZO <=4 SENSITIVE Sensitive     * >=100,000 COLONIES/mL PROVIDENCIA RETTGERI  Blood Culture ID Panel (Reflexed)     Status: Abnormal   Collection Time: 01/04/20  2:06 AM  Result Value Ref Range Status   Enterococcus faecalis NOT DETECTED NOT DETECTED Final   Enterococcus Faecium NOT DETECTED NOT DETECTED Final   Listeria monocytogenes NOT  DETECTED NOT DETECTED Final   Staphylococcus species NOT DETECTED NOT DETECTED Final   Staphylococcus aureus (BCID) NOT DETECTED NOT DETECTED Final   Staphylococcus epidermidis NOT DETECTED NOT DETECTED Final   Staphylococcus lugdunensis NOT DETECTED NOT DETECTED Final   Streptococcus species NOT DETECTED NOT DETECTED Final   Streptococcus agalactiae NOT DETECTED NOT DETECTED Final   Streptococcus pneumoniae NOT DETECTED NOT DETECTED Final   Streptococcus pyogenes NOT DETECTED NOT DETECTED Final   A.calcoaceticus-baumannii NOT DETECTED NOT DETECTED Final   Bacteroides fragilis NOT DETECTED NOT DETECTED Final   Enterobacterales DETECTED (A) NOT DETECTED Final    Comment: Enterobacterales represent a large order of gram negative bacteria, not a single organism. Refer to culture for further identification. CRITICAL RESULT CALLED TO, READ BACK BY AND VERIFIED WITH: PHRMD E JACKSON _0  01/04/20 BY S GEZAHEGN    Enterobacter cloacae complex NOT DETECTED NOT DETECTED Final   Escherichia coli NOT DETECTED NOT DETECTED Final   Klebsiella aerogenes NOT DETECTED NOT DETECTED Final   Klebsiella oxytoca NOT DETECTED NOT DETECTED Final   Klebsiella pneumoniae NOT DETECTED NOT DETECTED Final   Proteus species NOT DETECTED NOT DETECTED Final   Salmonella species NOT DETECTED NOT DETECTED Final   Serratia marcescens NOT DETECTED NOT DETECTED Final   Haemophilus influenzae NOT DETECTED NOT DETECTED Final   Neisseria meningitidis NOT DETECTED NOT DETECTED Final   Pseudomonas aeruginosa NOT DETECTED NOT DETECTED Final   Stenotrophomonas maltophilia NOT  DETECTED NOT DETECTED Final   Candida albicans NOT DETECTED NOT DETECTED Final   Candida auris NOT DETECTED NOT DETECTED Final   Candida glabrata NOT DETECTED NOT DETECTED Final   Candida krusei NOT DETECTED NOT DETECTED Final   Candida parapsilosis NOT DETECTED NOT DETECTED Final   Candida tropicalis NOT DETECTED NOT DETECTED Final   Cryptococcus  neoformans/gattii NOT DETECTED NOT DETECTED Final   CTX-M ESBL NOT DETECTED NOT DETECTED Final   Carbapenem resistance IMP NOT DETECTED NOT DETECTED Final   Carbapenem resistance KPC NOT DETECTED NOT DETECTED Final   Carbapenem resistance NDM NOT DETECTED NOT DETECTED Final   Carbapenem resist OXA 48 LIKE NOT DETECTED NOT DETECTED Final   Carbapenem resistance VIM NOT DETECTED NOT DETECTED Final    Comment: Performed at [facility], [address] [place], [place] [address]  Culture, blood (Routine x 2)     Status: Abnormal   Collection Time: 01/04/20  2:12 AM   Specimen: BLOOD LEFT HAND  Result Value Ref Range Status   Specimen Description   Final    BLOOD LEFT HAND Performed at [facility] [address], [place], [place] [address]    Special Requests   Final    BOTTLES DRAWN AEROBIC AND ANAEROBIC Blood Culture results may not be optimal due to an inadequate volume of blood received in culture bottles Performed at [facility] [address] [place], [place] [address]    Culture  Setup Time   Final    GRAM NEGATIVE RODS IN BOTH AEROBIC AND ANAEROBIC BOTTLES CRITICAL VALUE NOTED.  VALUE IS CONSISTENT WITH PREVIOUSLY REPORTED AND CALLED VALUE.    Culture (A)  Final    PROVIDENCIA RETTGERI SUSCEPTIBILITIES PERFORMED ON PREVIOUS CULTURE WITHIN THE LAST 5 DAYS. Performed at [facility], [place] [address] [place], [place] [address]    Report Status 01/06/2020 FINAL  Final  MRSA PCR Screening     Status: Abnormal   Collection Time: 01/04/20  9:53 AM   Specimen: Nasal Mucosa; Nasopharyngeal  Result Value Ref Range Status   MRSA by PCR POSITIVE (A) NEGATIVE Final    Comment:        The GeneXpert MRSA Assay (FDA approved for NASAL specimens only), is one component of a comprehensive MRSA colonization surveillance program. It is not intended to diagnose MRSA infection nor to guide or monitor treatment for MRSA  infections. RESULT CALLED TO, READ BACK BY AND VERIFIED WITH: HOUSE,J. RN AT 1225 01/04/20 MULLINS,T Performed at [facility], [place] [address], [place], [place] [address]   Respiratory Panel by RT PCR (Flu A&B, Covid) - Nasopharyngeal Swab     Status: None   Collection Time: 01/10/20 10:00 AM   Specimen: Nasopharyngeal Swab  Result Value Ref Range Status   SARS Coronavirus 2 by RT PCR NEGATIVE NEGATIVE Final    Comment: (NOTE) SARS-CoV-2 target nucleic acids are NOT DETECTED.  The SARS-CoV-2 RNA is generally detectable in upper respiratoy specimens during the acute phase of infection. The lowest concentration of SARS-CoV-2 viral copies this assay can detect is 131 copies/mL. A negative result does not preclude SARS-Cov-2 infection and should not be used as the sole basis for treatment or other patient management decisions. A negative result may occur with  improper specimen collection/handling, submission of specimen other than nasopharyngeal swab, presence of viral mutation(s) within the areas targeted by this assay, and inadequate number of viral copies (<131 copies/mL). A negative result  must be combined with clinical observations, patient history, and epidemiological information. The expected result is Negative.  Fact Sheet for Patients:  PinkCheek.be  Fact Sheet for Healthcare Providers:  GravelBags.it  This test is no t yet approved or cleared by the [place] FDA and  has been authorized for detection and/or diagnosis of SARS-CoV-2 by FDA under an Emergency Use Authorization (EUA). This EUA will remain  in effect (meaning this test can be used) for the duration of the COVID-19 declaration under Section 564(b)(1) of the Act, 21 U.S.C. section 360bbb-3(b)(1), unless the authorization is terminated or revoked sooner.     Influenza A by PCR NEGATIVE NEGATIVE Final   Influenza B by PCR NEGATIVE  NEGATIVE Final    Comment: (NOTE) The Xpert Xpress SARS-CoV-2/FLU/RSV assay is intended as an aid in  the diagnosis of influenza from Nasopharyngeal swab specimens and  should not be used as a sole basis for treatment. Nasal washings and  aspirates are unacceptable for Xpert Xpress SARS-CoV-2/FLU/RSV  testing.  Fact Sheet for Patients: PinkCheek.be  Fact Sheet for Healthcare Providers: GravelBags.it  This test is not yet approved or cleared by the [place] FDA and  has been authorized for detection and/or diagnosis of SARS-CoV-2 by  FDA under an Emergency Use Authorization (EUA). This EUA will remain  in effect (meaning this test can be used) for the duration of the  Covid-19 declaration under Section 564(b)(1) of the Act, 21  U.S.C. section 360bbb-3(b)(1), unless the authorization is  terminated or revoked. Performed at [facility], [place] [address] [place],  [address]      Labs: CBC: Recent Labs  Lab 01/04/20 0108 01/04/20 0108 01/05/20 0314 01/06/20 0312 01/07/20 0250 01/08/20 0256 01/09/20 0340  WBC 13.8*   < > 7.5 7.8 9.5 10.4 12.7*  NEUTROABS 12.1*  --   --  6.9  --  7.0  --   HGB 15.0   < > 11.9* 11.4* 10.8* 11.8* 10.4*  HCT 44.6   < > 35.9* 36.2* 33.1* 35.6* 31.6*  MCV 81.8   < > 82.5 86.0 82.8 82.8 81.9  PLT 146*   < > 108* 108* 114* 140* 169   < > = values in this interval not displayed.    Basic Metabolic Panel: Recent Labs  Lab 01/05/20 0314 01/06/20 0312 01/07/20 0250 01/08/20 0256 01/09/20 0340  NA 137 135 136 131* 134*  K 4.8 3.6 3.1* 3.5 3.7  CL 107 105 104 100 101  CO2 23 21* _0 GLUCOSE 95 99 104* 137* 107*  BUN 28* _1 CREATININE 0.94 0.65 0.60* 0.62 0.58*  CALCIUM 7.9* 7.7* 7.5* 7.4* 7.3*  MG 1.9 2.0  --  2.1  --   PHOS 2.6 2.8  --   --   --     Liver Function Tests: Recent Labs  Lab 01/04/20 0108 01/06/20 0312  AST 29  --    ALT 13  --   ALKPHOS 74  --   BILITOT 0.7  --   PROT 6.7  --   ALBUMIN 3.3* 2.2*    Urinalysis    Component Value Date/Time   COLORURINE RED (A) 01/04/2020 0206   APPEARANCEUR TURBID (A) 01/04/2020 0206   LABSPEC  01/04/2020 0206    TEST NOT REPORTED DUE TO COLOR INTERFERENCE OF URINE PIGMENT   PHURINE  01/04/2020 0206    TEST NOT REPORTED DUE TO COLOR INTERFERENCE OF URINE PIGMENT  GLUCOSEU (A) 01/04/2020 0206    TEST NOT REPORTED DUE TO COLOR INTERFERENCE OF URINE PIGMENT   HGBUR (A) 01/04/2020 0206    TEST NOT REPORTED DUE TO COLOR INTERFERENCE OF URINE PIGMENT   BILIRUBINUR (A) 01/04/2020 0206    TEST NOT REPORTED DUE TO COLOR INTERFERENCE OF URINE PIGMENT   KETONESUR (A) 01/04/2020 0206    TEST NOT REPORTED DUE TO COLOR INTERFERENCE OF URINE PIGMENT   PROTEINUR (A) 01/04/2020 0206    TEST NOT REPORTED DUE TO COLOR INTERFERENCE OF URINE PIGMENT   UROBILINOGEN 0.2 05/12/2013 2100   NITRITE (A) 01/04/2020 0206    TEST NOT REPORTED DUE TO COLOR INTERFERENCE OF URINE PIGMENT   LEUKOCYTESUR (A) 01/04/2020 0206    TEST NOT REPORTED DUE TO COLOR INTERFERENCE OF URINE PIGMENT    I had discussed in detail with patient's aunt via phone yesterday and updated her regarding his condition and care and had advised her of his potential DC to SNF today.  Time coordinating discharge: 40 minutes  SIGNED:  Vernell Leep, MD, [place], [facility]. [facility]  To contact the attending provider between 7A-7P or the covering provider during after hours 7P-7A, please log into the web site [email or web address] and access using universal [place] password for that web site. If you do not have the password, please call the hospital operator.

## 2020-01-10 NOTE — Discharge Instructions (Signed)

## 2020-01-10 NOTE — Evaluation (Signed)
Physical Therapy Evaluation Patient Details Name: Lee House MRN: [id number] DOB: [birth year] Today's Date: 01/10/2020   History of Present Illness  Pt admitted with AMS 2* urosepsis.  Pt with hx of Schizophrenia, Bipolar, COPD, and neurogenic bladder with indwelling catheter  Clinical Impression  Pt admitted as above and presenting with functional mobility limitations 2* generalized weakness, limited endurance and balance deficits.  Pt would benefit from follow up rehab at SNF level to further address deficits and maximize IND and safety.    Follow Up Recommendations SNF    Equipment Recommendations  None recommended by PT    Recommendations for Other Services       Precautions / Restrictions Precautions Precautions: Fall Restrictions Weight Bearing Restrictions: No      Mobility  Bed Mobility Overal bed mobility: Needs Assistance Bed Mobility: Supine to Sit     Supine to sit: Mod assist;+2 for physical assistance;+2 for safety/equipment     General bed mobility comments: Pt states he can not roll to side to get up so assisted to rotate with use of bed pad  Transfers Overall transfer level: Needs assistance Equipment used: Rolling walker (2 wheeled) Transfers: Sit to/from Stand Sit to Stand: Min assist;From elevated surface;+2 physical assistance;+2 safety/equipment         General transfer comment: cues for transition position and use of UEs to self assist;  Physical assist to bring wt up and fwd and to balance in standing  Ambulation/Gait Ambulation/Gait assistance: Min assist;+2 physical assistance;+2 safety/equipment Gait Distance (Feet): 3 Feet Assistive device: Rolling walker (2 wheeled) Gait Pattern/deviations: Step-to pattern;Decreased step length - right;Decreased step length - left;Shuffle;Trunk flexed Gait velocity: decr   General Gait Details: short shuffling step with cues for posture and position from RW and physical assist for  balance/support and RW management  Stairs            Wheelchair Mobility    Modified Rankin (Stroke Patients Only)       Balance Overall balance assessment: Needs assistance Sitting-balance support: No upper extremity supported;Feet supported Sitting balance-Leahy Scale: Fair     Standing balance support: Bilateral upper extremity supported Standing balance-Leahy Scale: Poor                               Pertinent Vitals/Pain Pain Assessment: Faces Faces Pain Scale: Hurts little more Pain Location: elbows with attempt to move in bed Pain Descriptors / Indicators: Sore Pain Intervention(s): Limited activity within patient's tolerance;Monitored during session    Home Living Family/patient expects to be discharged to:: Skilled nursing facility                      Prior Function Level of Independence: Needs assistance   Gait / Transfers Assistance Needed: pt states he uses RW and someone walks with him           Hand Dominance        Extremity/Trunk Assessment   Upper Extremity Assessment Upper Extremity Assessment: Generalized weakness    Lower Extremity Assessment Lower Extremity Assessment: Generalized weakness       Communication   Communication: No difficulties  Cognition Arousal/Alertness: Lethargic (but rousable) Behavior During Therapy: Flat affect Overall Cognitive Status: History of cognitive impairments - at baseline  General Comments      Exercises     Assessment/Plan    PT Assessment Patient needs continued PT services  PT Problem List Decreased strength;Decreased activity tolerance;Decreased balance;Decreased mobility;Decreased knowledge of use of DME;Pain;Decreased cognition;Decreased safety awareness       PT Treatment Interventions DME instruction;Gait training;Functional mobility training;Therapeutic activities;Therapeutic exercise;Patient/family  education    PT Goals (Current goals can be found in the Care Plan section)  Acute Rehab PT Goals Patient Stated Goal: Sit up in chair PT Goal Formulation: With patient Time For Goal Achievement: 01/24/20 Potential to Achieve Goals: Fair    Frequency Min 2X/week   Barriers to discharge        Co-evaluation               AM-PAC PT "6 Clicks" Mobility  Outcome Measure Help needed turning from your back to your side while in a flat bed without using bedrails?: A Lot Help needed moving from lying on your back to sitting on the side of a flat bed without using bedrails?: A Lot Help needed moving to and from a bed to a chair (including a wheelchair)?: A Lot Help needed standing up from a chair using your arms (e.g., wheelchair or bedside chair)?: A Lot Help needed to walk in hospital room?: A Lot Help needed climbing 3-5 steps with a railing? : Total 6 Click Score: 11    End of Session Equipment Utilized During Treatment: Gait belt Activity Tolerance: Patient limited by fatigue Patient left: in chair;with call bell/phone within reach;with chair alarm set Nurse Communication: Mobility status PT Visit Diagnosis: Unsteadiness on feet (R26.81);Muscle weakness (generalized) (M62.81);Difficulty in walking, not elsewhere classified (R26.2)    Time: 4166-0630 PT Time Calculation (min) (ACUTE ONLY): 23 min   Charges:   PT Evaluation $PT Eval Low Complexity: 1 Low PT Treatments $Gait Training: 8-22 mins        Debe Coder PT Acute Rehabilitation Services Pager [phone] Office [phone]   Harmon Bommarito 01/10/2020, 11:08 AM

## 2020-01-10 NOTE — TOC Transition Note (Addendum)
Transition of Care [facility]) - CM/SW Discharge Note   Patient Details  Name: Jaiyon Wander MRN: [id number] Date of Birth: [birth year]  Transition of [facility]) CM/SW Contact:  Lia Hopping, [place] Phone Number: 01/10/2020, 11:38 AM   Clinical Narrative:    CSW confirmed [facility] ready to accept the patient today, pending neg. covid test. [facility] authorization reference# [id number] Nurse call report to: [phone] Room 219  Patient [place] approved 5 days start date 9/24, next review 9/28. Auth# [id number]   Final next level of care: Skilled Nursing Facility Barriers to Discharge: Continued Medical Work up   Patient Goals and CMS Choice Patient states their goals for this hospitalization and ongoing recovery are:: pt is confused at this time CMS Medicare.gov Compare Post Acute Care list provided to:: Patient Choice offered to / list presented to : Patient  Discharge Placement    Patient chooses bed at: [facility] Patient to be transferred to facility by: [place] Name of family member notified: Eudy,Glenda/Edger  [phone], Left voicemail 2x. Notified sister Stanton Kidney [phone] Patient and family notified of of transfer: 01/10/20                                 Discharge Plan and Services   Discharge Planning Services: CM Consult            DME Arranged: N/A DME Agency: NA       HH Arranged: NA HH Agency: NA        Social Determinants of Health ([place]) Interventions     Readmission Risk Interventions Readmission Risk Prevention Plan 09/12/2019  Post Dischage Appt Complete  Medication Screening Complete  Transportation Screening Complete  Some recent data might be hidden

## 2022-12-18 ENCOUNTER — Encounter (HOSPITAL_COMMUNITY): Payer: Self-pay

## 2022-12-18 ENCOUNTER — Emergency Department (HOSPITAL_COMMUNITY)
Admission: EM | Admit: 2022-12-18 | Discharge: 2022-12-18 | Disposition: A | Discharge status: Home / Self Care | Payer: Medicare Other | Attending: Emergency Medicine | Admitting: Emergency Medicine

## 2022-12-18 ENCOUNTER — Emergency Department (HOSPITAL_COMMUNITY): Payer: Medicare Other

## 2022-12-18 ENCOUNTER — Other Ambulatory Visit: Payer: Self-pay

## 2022-12-18 DIAGNOSIS — Z7982 Long term (current) use of aspirin: Secondary | ICD-10-CM | POA: Insufficient documentation

## 2022-12-18 DIAGNOSIS — R0600 Dyspnea, unspecified: Secondary | ICD-10-CM | POA: Diagnosis not present

## 2022-12-18 DIAGNOSIS — I739 Peripheral vascular disease, unspecified: Secondary | ICD-10-CM | POA: Diagnosis not present

## 2022-12-18 DIAGNOSIS — I890 Lymphedema, not elsewhere classified: Secondary | ICD-10-CM | POA: Diagnosis not present

## 2022-12-18 DIAGNOSIS — M7989 Other specified soft tissue disorders: Secondary | ICD-10-CM | POA: Diagnosis present

## 2022-12-18 LAB — CBC WITH DIFFERENTIAL/PLATELET
Abs Immature Granulocytes: 0.03 10*3/uL (ref 0.00–0.07)
Basophils Absolute: 0 10*3/uL (ref 0.0–0.1)
Basophils Relative: 0 %
Eosinophils Absolute: 0.1 10*3/uL (ref 0.0–0.5)
Eosinophils Relative: 1 %
HCT: 40.3 % (ref 39.0–52.0)
Hemoglobin: 13.7 g/dL (ref 13.0–17.0)
Immature Granulocytes: 0 %
Lymphocytes Relative: 16 %
Lymphs Abs: 1.2 10*3/uL (ref 0.7–4.0)
MCH: 29.7 pg (ref 26.0–34.0)
MCHC: 34 g/dL (ref 30.0–36.0)
MCV: 87.4 fL (ref 80.0–100.0)
Monocytes Absolute: 1 10*3/uL (ref 0.1–1.0)
Monocytes Relative: 12 %
Neutro Abs: 5.5 10*3/uL (ref 1.7–7.7)
Neutrophils Relative %: 71 %
Platelets: 233 10*3/uL (ref 150–400)
RBC: 4.61 MIL/uL (ref 4.22–5.81)
RDW: 13 % (ref 11.5–15.5)
WBC: 7.8 10*3/uL (ref 4.0–10.5)
nRBC: 0 % (ref 0.0–0.2)

## 2022-12-18 LAB — D-DIMER, QUANTITATIVE: D-Dimer, Quant: 0.49 ug{FEU}/mL (ref 0.00–0.50)

## 2022-12-18 LAB — BASIC METABOLIC PANEL
Anion gap: 12 (ref 5–15)
BUN: 17 mg/dL (ref 6–20)
CO2: 27 mmol/L (ref 22–32)
Calcium: 8.7 mg/dL — ABNORMAL LOW (ref 8.9–10.3)
Chloride: 93 mmol/L — ABNORMAL LOW (ref 98–111)
Creatinine, Ser: 0.94 mg/dL (ref 0.61–1.24)
GFR, Estimated: >60 mL/min (ref >60)
Glucose, Bld: 104 mg/dL — ABNORMAL HIGH (ref 70–99)
Potassium: 3.8 mmol/L (ref 3.5–5.1)
Sodium: 132 mmol/L — ABNORMAL LOW (ref 135–145)

## 2022-12-18 LAB — BRAIN NATRIURETIC PEPTIDE: B Natriuretic Peptide: 27.4 pg/mL (ref 0.0–100.0)

## 2022-12-18 MED ORDER — DOXYCYCLINE HYCLATE 100 MG PO CAPS: 100.0000 mg | ORAL_CAPSULE | Freq: Two times a day (BID) | ORAL | 0 refills | Status: AC

## 2022-12-18 MED ORDER — DOXYCYCLINE HYCLATE 100 MG PO CAPS: 100.0000 mg | ORAL_CAPSULE | Freq: Two times a day (BID) | ORAL | 0 refills | Status: DC

## 2022-12-18 NOTE — ED Provider Notes (Signed)
[place] EMERGENCY DEPARTMENT AT [facility] Provider Note   CSN: [id number] Arrival date & time: 12/18/22  1025     History  Chief Complaint  Patient presents with   Leg Pain   Leg Swelling    Lee House is a 57 y.o. male.  This is a 57 year old male here today for leg swelling.  Patient has a history of peripheral vascular disease, developmental delay here with his guardian.  Patient lives in a skilled nursing facility.  He has been having increasing swelling in his bilateral lower extremities, left worse than right.  There has been some discharge.  No fever, no chills.  No dyspnea.   Leg Pain      Home Medications Prior to Admission medications   Medication Sig Start Date End Date Taking? Authorizing Provider  doxycycline (VIBRAMYCIN) 100 MG capsule Take 1 capsule (100 mg total) by mouth 2 (two) times daily. 12/18/22  Yes Anders Simmonds T, DO  acetaminophen (TYLENOL) 325 MG tablet Take 2 tablets (650 mg total) by mouth every 6 (six) hours as needed for mild pain (or Fever >/= 101). 10/11/19   Zannie Cove, MD  albuterol (PROVENTIL HFA;VENTOLIN HFA) 108 (90 BASE) MCG/ACT inhaler Inhale 2 puffs into the lungs every 4 (four) hours as needed for shortness of breath.     [provider]  ALPRAZolam Prudy Feeler) 0.25 MG tablet Take 1 tablet (0.25 mg total) by mouth 2 (two) times daily as needed for anxiety. Patient taking differently: Take 0.5 mg by mouth 2 (two) times daily as needed for anxiety.  10/11/19   Zannie Cove, MD  aspirin EC 81 MG tablet Take 81 mg by mouth in the morning and at bedtime. Swallow whole.    [provider]  benztropine (COGENTIN) 0.5 MG tablet Take 0.5 mg by mouth 2 (two) times daily.    [provider]  Calcium Carb-Cholecalciferol (CALCIUM 500 +D PO) Take 1 tablet by mouth daily.    [provider]  divalproex (DEPAKOTE) 250 MG DR tablet Take 250 mg by mouth 3 (three) times daily.     [provider]  ferrous sulfate (FEROSUL) 325 (65 FE) MG tablet Take 325 mg by mouth 2 (two) times daily with a meal.    [provider]  Fluticasone-Salmeterol (ADVAIR) 250-50 MCG/DOSE AEPB Inhale 1 puff into the lungs every 12 (twelve) hours.    [provider]  Multiple Vitamins-Minerals (CEROVITE SENIOR) TABS Take 1 tablet by mouth daily.    [provider]  OLANZapine (ZYPREXA) 10 MG tablet Take 10 mg by mouth 2 (two) times daily.     [provider]  pantoprazole (PROTONIX) 40 MG tablet Take 40 mg by mouth daily.    [provider]  polyethylene glycol (MIRALAX / GLYCOLAX) 17 g packet Take 17 g by mouth daily. 01/11/20   Hongalgi, Maximino Greenland, MD  risperidone (RISPERDAL) 4 MG tablet Take 0.5 tablets (2 mg total) by mouth daily. 01/10/20   Hongalgi, Maximino Greenland, MD  senna-docusate (SENOKOT-S) 8.6-50 MG tablet Take 1 tablet by mouth 2 (two) times daily as needed for constipation. 08/23/19   [provider]  tamsulosin (FLOMAX) 0.4 MG CAPS capsule Take 0.4 mg by mouth daily.    [provider]  traZODone (DESYREL) 50 MG tablet Take 50 mg by mouth at bedtime as needed for sleep.     [provider]      Allergies    Penicillins    Review  of Systems   Review of Systems  Physical Exam Updated Vital Signs BP 110/71   Pulse 84   Temp (!) 97.4 F (36.3 C)   Resp 16   SpO2 100%  Physical Exam Vitals reviewed.  HENT:     Head: Normocephalic.  Cardiovascular:     Rate and Rhythm: Normal rate and regular rhythm.  Pulmonary:     Effort: Pulmonary effort is normal.     Breath sounds: Normal breath sounds.  Musculoskeletal:     Comments: Chronic venous stasis changes bilaterally in the bilateral lower extremities.  Mild swelling up to the calf.  On the left leg, there is a small area of skin breakdown, with some slight serosanguineous drainage.  No crepitus, no pus, no erythema  Neurological:     Mental Status: He is alert.      ED Results / Procedures / Treatments   Labs (all labs ordered are listed, but only abnormal results are displayed) Labs Reviewed  BASIC METABOLIC PANEL - Abnormal; Notable for the following components:      Result Value   Sodium 132 (*)    Chloride 93 (*)    Glucose, Bld 104 (*)    Calcium 8.7 (*)    All other components within normal limits  D-DIMER, QUANTITATIVE  CBC WITH DIFFERENTIAL/PLATELET  BRAIN NATRIURETIC PEPTIDE    EKG None  Radiology DG Chest Portable 1 View  Result Date: 12/18/2022 CLINICAL DATA:  Dyspnea. EXAM: PORTABLE CHEST 1 VIEW COMPARISON:  Chest radiographs 01/04/2020 and 09/29/2019 FINDINGS: Cardiac silhouette and mediastinal contours are within normal limits. Mild bilateral lower lung interstitial thickening is unchanged from prior, likely mild chronic scarring. No new acute airspace opacity. No pleural effusion or pneumothorax. Mild multilevel degenerative disc changes of the thoracic spine. IMPRESSION: 1. No acute cardiopulmonary process. 2. Mild bilateral lower lung interstitial thickening is unchanged from prior, likely mild chronic scarring. Electronically Signed   By: Neita Garnet M.D.   On: 12/18/2022 12:33    Procedures Procedures    Medications Ordered in ED Medications - No data to display  ED Course/ Medical Decision Making/ A&P                                 Medical Decision Making This is a 57 year old male is here today for chronic leg swelling, worse than usual.  Differential diagnoses include lymphedema, DVT, cellulitis.  Plan as patient's legs warm, well-perfused.  Good pulses.  Suspect chronic venous stasis changes.  Will order a D-dimer to assess for blood clot, if elevated will get ultrasound.  BNP ordered, although I think that this patient likely does not have heart failure.  Reassessment-BNP 27, D-dimer negative.  Lymphedema.  May be some early cellulitis developing on that left lower extremity.  Will discharge with some  Keflex.  Will have the patient follow-up with his primary care doctor.  Will discharge back to skilled nursing facility.  Amount and/or Complexity of Data Reviewed Labs: ordered. Radiology: ordered.           Final Clinical Impression(s) / ED Diagnoses Final diagnoses:  Lymphedema    Rx / DC Orders ED Discharge Orders          Ordered    doxycycline (VIBRAMYCIN) 100 MG capsule  2 times daily        12/18/22 1329              Andria Meuse,  Jomarie Longs T, DO 12/18/22 1329

## 2022-12-18 NOTE — Discharge Instructions (Addendum)
Your symptoms are being caused by was called lymphedema, which is fluid building up in your legs.  This is often caused by weakness and valves in the veins in your legs.  You should wear compression socks, and keep your feet elevated.  Sending you with a few days of antibiotics to help prevent infection on the inside of the leg.  The lab testing that we did indicate that there is no blood clot.

## 2022-12-18 NOTE — ED Triage Notes (Addendum)
Pt BIB [facility] from [place] c/o bilateral leg swelling x4 days. Pt has some discoloration to bilateral legs and is c/o pain in both legs and feet. Pt does have pedal pulses. Pt is confused at baseline and does have agitation but is currently calm. Pt is alert and oriented to himself. Pt has a 20g LAC.

## 2022-12-18 NOTE — ED Notes (Signed)
Legs have been swollen in past but the weeping of the skin on left ankle has gotten worse.
